# Patient Record
Sex: Female | Born: 1945
Health system: Southern US, Community
[De-identification: ages and names within clinical notes are randomized; demographics above are authoritative.]

## PROBLEM LIST (undated history)

## (undated) DIAGNOSIS — J449 Chronic obstructive pulmonary disease, unspecified: Secondary | ICD-10-CM

## (undated) DIAGNOSIS — C50919 Malignant neoplasm of unspecified site of unspecified female breast: Secondary | ICD-10-CM

## (undated) DIAGNOSIS — I509 Heart failure, unspecified: Secondary | ICD-10-CM

## (undated) DIAGNOSIS — N809 Endometriosis, unspecified: Secondary | ICD-10-CM

## (undated) HISTORY — DX: Endometriosis, unspecified: N80.9

## (undated) HISTORY — DX: Malignant neoplasm of unspecified site of unspecified female breast: C50.919

## (undated) HISTORY — DX: Chronic obstructive pulmonary disease, unspecified: J44.9

## (undated) HISTORY — PX: MASTECTOMY: SHX3

## (undated) HISTORY — PX: ABDOMINAL HYSTERECTOMY: SUR658

---

## 1999-08-24 ENCOUNTER — Ambulatory Visit (HOSPITAL_BASED_OUTPATIENT_CLINIC_OR_DEPARTMENT_OTHER): Admission: RE | Admit: 1999-08-24 | Discharge: 1999-08-24 | Payer: Self-pay | Admitting: Plastic Surgery

## 2005-06-13 ENCOUNTER — Encounter: Admission: RE | Admit: 2005-06-13 | Discharge: 2005-06-13 | Payer: Self-pay | Admitting: Internal Medicine

## 2015-11-17 DIAGNOSIS — L821 Other seborrheic keratosis: Secondary | ICD-10-CM | POA: Diagnosis not present

## 2015-11-17 DIAGNOSIS — D1801 Hemangioma of skin and subcutaneous tissue: Secondary | ICD-10-CM | POA: Diagnosis not present

## 2015-11-17 DIAGNOSIS — L578 Other skin changes due to chronic exposure to nonionizing radiation: Secondary | ICD-10-CM | POA: Diagnosis not present

## 2015-11-17 DIAGNOSIS — L570 Actinic keratosis: Secondary | ICD-10-CM | POA: Diagnosis not present

## 2015-11-25 DIAGNOSIS — Z853 Personal history of malignant neoplasm of breast: Secondary | ICD-10-CM | POA: Diagnosis not present

## 2015-12-03 DIAGNOSIS — C50812 Malignant neoplasm of overlapping sites of left female breast: Secondary | ICD-10-CM | POA: Diagnosis not present

## 2016-01-12 DIAGNOSIS — M62838 Other muscle spasm: Secondary | ICD-10-CM | POA: Diagnosis not present

## 2016-01-12 DIAGNOSIS — M9904 Segmental and somatic dysfunction of sacral region: Secondary | ICD-10-CM | POA: Diagnosis not present

## 2016-01-12 DIAGNOSIS — M9903 Segmental and somatic dysfunction of lumbar region: Secondary | ICD-10-CM | POA: Diagnosis not present

## 2016-01-12 DIAGNOSIS — M9902 Segmental and somatic dysfunction of thoracic region: Secondary | ICD-10-CM | POA: Diagnosis not present

## 2016-01-12 DIAGNOSIS — M546 Pain in thoracic spine: Secondary | ICD-10-CM | POA: Diagnosis not present

## 2016-01-12 DIAGNOSIS — M545 Low back pain: Secondary | ICD-10-CM | POA: Diagnosis not present

## 2016-01-17 DIAGNOSIS — Z1389 Encounter for screening for other disorder: Secondary | ICD-10-CM | POA: Diagnosis not present

## 2016-01-17 DIAGNOSIS — E785 Hyperlipidemia, unspecified: Secondary | ICD-10-CM | POA: Diagnosis not present

## 2016-01-17 DIAGNOSIS — M9902 Segmental and somatic dysfunction of thoracic region: Secondary | ICD-10-CM | POA: Diagnosis not present

## 2016-01-17 DIAGNOSIS — I10 Essential (primary) hypertension: Secondary | ICD-10-CM | POA: Diagnosis not present

## 2016-01-17 DIAGNOSIS — Z0000 Encounter for general adult medical examination without abnormal findings: Secondary | ICD-10-CM | POA: Diagnosis not present

## 2016-01-17 DIAGNOSIS — J449 Chronic obstructive pulmonary disease, unspecified: Secondary | ICD-10-CM | POA: Diagnosis not present

## 2016-01-17 DIAGNOSIS — M9904 Segmental and somatic dysfunction of sacral region: Secondary | ICD-10-CM | POA: Diagnosis not present

## 2016-01-17 DIAGNOSIS — R5383 Other fatigue: Secondary | ICD-10-CM | POA: Diagnosis not present

## 2016-01-17 DIAGNOSIS — M546 Pain in thoracic spine: Secondary | ICD-10-CM | POA: Diagnosis not present

## 2016-01-17 DIAGNOSIS — M545 Low back pain: Secondary | ICD-10-CM | POA: Diagnosis not present

## 2016-01-17 DIAGNOSIS — Z6827 Body mass index (BMI) 27.0-27.9, adult: Secondary | ICD-10-CM | POA: Diagnosis not present

## 2016-01-17 DIAGNOSIS — M62838 Other muscle spasm: Secondary | ICD-10-CM | POA: Diagnosis not present

## 2016-01-17 DIAGNOSIS — M9903 Segmental and somatic dysfunction of lumbar region: Secondary | ICD-10-CM | POA: Diagnosis not present

## 2016-01-17 DIAGNOSIS — E039 Hypothyroidism, unspecified: Secondary | ICD-10-CM | POA: Diagnosis not present

## 2016-01-17 DIAGNOSIS — Z719 Counseling, unspecified: Secondary | ICD-10-CM | POA: Diagnosis not present

## 2016-01-19 DIAGNOSIS — M9903 Segmental and somatic dysfunction of lumbar region: Secondary | ICD-10-CM | POA: Diagnosis not present

## 2016-01-19 DIAGNOSIS — M546 Pain in thoracic spine: Secondary | ICD-10-CM | POA: Diagnosis not present

## 2016-01-19 DIAGNOSIS — M9902 Segmental and somatic dysfunction of thoracic region: Secondary | ICD-10-CM | POA: Diagnosis not present

## 2016-01-19 DIAGNOSIS — M62838 Other muscle spasm: Secondary | ICD-10-CM | POA: Diagnosis not present

## 2016-01-19 DIAGNOSIS — M545 Low back pain: Secondary | ICD-10-CM | POA: Diagnosis not present

## 2016-01-19 DIAGNOSIS — M9904 Segmental and somatic dysfunction of sacral region: Secondary | ICD-10-CM | POA: Diagnosis not present

## 2016-01-26 DIAGNOSIS — M9903 Segmental and somatic dysfunction of lumbar region: Secondary | ICD-10-CM | POA: Diagnosis not present

## 2016-01-26 DIAGNOSIS — M546 Pain in thoracic spine: Secondary | ICD-10-CM | POA: Diagnosis not present

## 2016-01-26 DIAGNOSIS — M9902 Segmental and somatic dysfunction of thoracic region: Secondary | ICD-10-CM | POA: Diagnosis not present

## 2016-01-26 DIAGNOSIS — M9904 Segmental and somatic dysfunction of sacral region: Secondary | ICD-10-CM | POA: Diagnosis not present

## 2016-01-26 DIAGNOSIS — M545 Low back pain: Secondary | ICD-10-CM | POA: Diagnosis not present

## 2016-01-26 DIAGNOSIS — M62838 Other muscle spasm: Secondary | ICD-10-CM | POA: Diagnosis not present

## 2016-01-28 DIAGNOSIS — E785 Hyperlipidemia, unspecified: Secondary | ICD-10-CM | POA: Diagnosis not present

## 2016-01-28 DIAGNOSIS — Z6827 Body mass index (BMI) 27.0-27.9, adult: Secondary | ICD-10-CM | POA: Diagnosis not present

## 2016-01-28 DIAGNOSIS — M62838 Other muscle spasm: Secondary | ICD-10-CM | POA: Diagnosis not present

## 2016-01-28 DIAGNOSIS — M9903 Segmental and somatic dysfunction of lumbar region: Secondary | ICD-10-CM | POA: Diagnosis not present

## 2016-01-28 DIAGNOSIS — M546 Pain in thoracic spine: Secondary | ICD-10-CM | POA: Diagnosis not present

## 2016-01-28 DIAGNOSIS — M545 Low back pain: Secondary | ICD-10-CM | POA: Diagnosis not present

## 2016-01-28 DIAGNOSIS — E039 Hypothyroidism, unspecified: Secondary | ICD-10-CM | POA: Diagnosis not present

## 2016-01-28 DIAGNOSIS — M9904 Segmental and somatic dysfunction of sacral region: Secondary | ICD-10-CM | POA: Diagnosis not present

## 2016-01-28 DIAGNOSIS — M9902 Segmental and somatic dysfunction of thoracic region: Secondary | ICD-10-CM | POA: Diagnosis not present

## 2016-02-02 DIAGNOSIS — M62838 Other muscle spasm: Secondary | ICD-10-CM | POA: Diagnosis not present

## 2016-02-02 DIAGNOSIS — M9904 Segmental and somatic dysfunction of sacral region: Secondary | ICD-10-CM | POA: Diagnosis not present

## 2016-02-02 DIAGNOSIS — M9903 Segmental and somatic dysfunction of lumbar region: Secondary | ICD-10-CM | POA: Diagnosis not present

## 2016-02-02 DIAGNOSIS — M545 Low back pain: Secondary | ICD-10-CM | POA: Diagnosis not present

## 2016-02-02 DIAGNOSIS — M9902 Segmental and somatic dysfunction of thoracic region: Secondary | ICD-10-CM | POA: Diagnosis not present

## 2016-02-02 DIAGNOSIS — M546 Pain in thoracic spine: Secondary | ICD-10-CM | POA: Diagnosis not present

## 2016-02-11 DIAGNOSIS — M9902 Segmental and somatic dysfunction of thoracic region: Secondary | ICD-10-CM | POA: Diagnosis not present

## 2016-02-11 DIAGNOSIS — M9903 Segmental and somatic dysfunction of lumbar region: Secondary | ICD-10-CM | POA: Diagnosis not present

## 2016-02-11 DIAGNOSIS — M62838 Other muscle spasm: Secondary | ICD-10-CM | POA: Diagnosis not present

## 2016-02-11 DIAGNOSIS — M546 Pain in thoracic spine: Secondary | ICD-10-CM | POA: Diagnosis not present

## 2016-02-11 DIAGNOSIS — M9904 Segmental and somatic dysfunction of sacral region: Secondary | ICD-10-CM | POA: Diagnosis not present

## 2016-02-11 DIAGNOSIS — M545 Low back pain: Secondary | ICD-10-CM | POA: Diagnosis not present

## 2016-02-22 DIAGNOSIS — R05 Cough: Secondary | ICD-10-CM | POA: Diagnosis not present

## 2016-02-22 DIAGNOSIS — Z6826 Body mass index (BMI) 26.0-26.9, adult: Secondary | ICD-10-CM | POA: Diagnosis not present

## 2016-02-23 DIAGNOSIS — L298 Other pruritus: Secondary | ICD-10-CM | POA: Diagnosis not present

## 2016-02-23 DIAGNOSIS — R238 Other skin changes: Secondary | ICD-10-CM | POA: Diagnosis not present

## 2016-02-23 DIAGNOSIS — D485 Neoplasm of uncertain behavior of skin: Secondary | ICD-10-CM | POA: Diagnosis not present

## 2016-02-23 DIAGNOSIS — L820 Inflamed seborrheic keratosis: Secondary | ICD-10-CM | POA: Diagnosis not present

## 2016-02-23 DIAGNOSIS — L821 Other seborrheic keratosis: Secondary | ICD-10-CM | POA: Diagnosis not present

## 2016-02-23 DIAGNOSIS — L538 Other specified erythematous conditions: Secondary | ICD-10-CM | POA: Diagnosis not present

## 2016-02-23 DIAGNOSIS — L570 Actinic keratosis: Secondary | ICD-10-CM | POA: Diagnosis not present

## 2016-03-02 DIAGNOSIS — L570 Actinic keratosis: Secondary | ICD-10-CM | POA: Diagnosis not present

## 2016-03-03 DIAGNOSIS — M9903 Segmental and somatic dysfunction of lumbar region: Secondary | ICD-10-CM | POA: Diagnosis not present

## 2016-03-03 DIAGNOSIS — M545 Low back pain: Secondary | ICD-10-CM | POA: Diagnosis not present

## 2016-03-03 DIAGNOSIS — M546 Pain in thoracic spine: Secondary | ICD-10-CM | POA: Diagnosis not present

## 2016-03-03 DIAGNOSIS — M9904 Segmental and somatic dysfunction of sacral region: Secondary | ICD-10-CM | POA: Diagnosis not present

## 2016-03-03 DIAGNOSIS — M62838 Other muscle spasm: Secondary | ICD-10-CM | POA: Diagnosis not present

## 2016-03-03 DIAGNOSIS — M9902 Segmental and somatic dysfunction of thoracic region: Secondary | ICD-10-CM | POA: Diagnosis not present

## 2016-03-08 DIAGNOSIS — M546 Pain in thoracic spine: Secondary | ICD-10-CM | POA: Diagnosis not present

## 2016-03-08 DIAGNOSIS — M545 Low back pain: Secondary | ICD-10-CM | POA: Diagnosis not present

## 2016-03-08 DIAGNOSIS — M9903 Segmental and somatic dysfunction of lumbar region: Secondary | ICD-10-CM | POA: Diagnosis not present

## 2016-03-08 DIAGNOSIS — M9904 Segmental and somatic dysfunction of sacral region: Secondary | ICD-10-CM | POA: Diagnosis not present

## 2016-03-08 DIAGNOSIS — M9902 Segmental and somatic dysfunction of thoracic region: Secondary | ICD-10-CM | POA: Diagnosis not present

## 2016-03-08 DIAGNOSIS — M62838 Other muscle spasm: Secondary | ICD-10-CM | POA: Diagnosis not present

## 2016-05-09 DIAGNOSIS — I10 Essential (primary) hypertension: Secondary | ICD-10-CM | POA: Diagnosis not present

## 2016-05-09 DIAGNOSIS — F5102 Adjustment insomnia: Secondary | ICD-10-CM | POA: Diagnosis not present

## 2016-05-09 DIAGNOSIS — E039 Hypothyroidism, unspecified: Secondary | ICD-10-CM | POA: Diagnosis not present

## 2016-05-09 DIAGNOSIS — J449 Chronic obstructive pulmonary disease, unspecified: Secondary | ICD-10-CM | POA: Diagnosis not present

## 2016-05-09 DIAGNOSIS — E7800 Pure hypercholesterolemia, unspecified: Secondary | ICD-10-CM | POA: Diagnosis not present

## 2016-05-09 DIAGNOSIS — R0789 Other chest pain: Secondary | ICD-10-CM | POA: Diagnosis not present

## 2016-07-11 DIAGNOSIS — D485 Neoplasm of uncertain behavior of skin: Secondary | ICD-10-CM | POA: Diagnosis not present

## 2016-07-11 DIAGNOSIS — L820 Inflamed seborrheic keratosis: Secondary | ICD-10-CM | POA: Diagnosis not present

## 2016-07-11 DIAGNOSIS — L570 Actinic keratosis: Secondary | ICD-10-CM | POA: Diagnosis not present

## 2016-07-11 DIAGNOSIS — Z85828 Personal history of other malignant neoplasm of skin: Secondary | ICD-10-CM | POA: Diagnosis not present

## 2016-07-26 DIAGNOSIS — N651 Disproportion of reconstructed breast: Secondary | ICD-10-CM | POA: Diagnosis not present

## 2016-07-26 DIAGNOSIS — Z9012 Acquired absence of left breast and nipple: Secondary | ICD-10-CM | POA: Diagnosis not present

## 2016-07-26 DIAGNOSIS — Z853 Personal history of malignant neoplasm of breast: Secondary | ICD-10-CM | POA: Diagnosis not present

## 2016-08-23 DIAGNOSIS — M62838 Other muscle spasm: Secondary | ICD-10-CM | POA: Diagnosis not present

## 2016-08-23 DIAGNOSIS — M9903 Segmental and somatic dysfunction of lumbar region: Secondary | ICD-10-CM | POA: Diagnosis not present

## 2016-08-23 DIAGNOSIS — M9904 Segmental and somatic dysfunction of sacral region: Secondary | ICD-10-CM | POA: Diagnosis not present

## 2016-08-23 DIAGNOSIS — M546 Pain in thoracic spine: Secondary | ICD-10-CM | POA: Diagnosis not present

## 2016-08-23 DIAGNOSIS — M545 Low back pain: Secondary | ICD-10-CM | POA: Diagnosis not present

## 2016-08-23 DIAGNOSIS — M9902 Segmental and somatic dysfunction of thoracic region: Secondary | ICD-10-CM | POA: Diagnosis not present

## 2016-08-25 DIAGNOSIS — M9903 Segmental and somatic dysfunction of lumbar region: Secondary | ICD-10-CM | POA: Diagnosis not present

## 2016-08-25 DIAGNOSIS — M62838 Other muscle spasm: Secondary | ICD-10-CM | POA: Diagnosis not present

## 2016-08-25 DIAGNOSIS — M546 Pain in thoracic spine: Secondary | ICD-10-CM | POA: Diagnosis not present

## 2016-08-25 DIAGNOSIS — M545 Low back pain: Secondary | ICD-10-CM | POA: Diagnosis not present

## 2016-08-25 DIAGNOSIS — M9904 Segmental and somatic dysfunction of sacral region: Secondary | ICD-10-CM | POA: Diagnosis not present

## 2016-08-25 DIAGNOSIS — M9902 Segmental and somatic dysfunction of thoracic region: Secondary | ICD-10-CM | POA: Diagnosis not present

## 2016-08-28 DIAGNOSIS — M546 Pain in thoracic spine: Secondary | ICD-10-CM | POA: Diagnosis not present

## 2016-08-28 DIAGNOSIS — M62838 Other muscle spasm: Secondary | ICD-10-CM | POA: Diagnosis not present

## 2016-08-28 DIAGNOSIS — M9904 Segmental and somatic dysfunction of sacral region: Secondary | ICD-10-CM | POA: Diagnosis not present

## 2016-08-28 DIAGNOSIS — M545 Low back pain: Secondary | ICD-10-CM | POA: Diagnosis not present

## 2016-08-28 DIAGNOSIS — M9903 Segmental and somatic dysfunction of lumbar region: Secondary | ICD-10-CM | POA: Diagnosis not present

## 2016-08-28 DIAGNOSIS — M9902 Segmental and somatic dysfunction of thoracic region: Secondary | ICD-10-CM | POA: Diagnosis not present

## 2016-08-30 DIAGNOSIS — M546 Pain in thoracic spine: Secondary | ICD-10-CM | POA: Diagnosis not present

## 2016-08-30 DIAGNOSIS — M9902 Segmental and somatic dysfunction of thoracic region: Secondary | ICD-10-CM | POA: Diagnosis not present

## 2016-08-30 DIAGNOSIS — M9903 Segmental and somatic dysfunction of lumbar region: Secondary | ICD-10-CM | POA: Diagnosis not present

## 2016-08-30 DIAGNOSIS — M62838 Other muscle spasm: Secondary | ICD-10-CM | POA: Diagnosis not present

## 2016-08-30 DIAGNOSIS — M545 Low back pain: Secondary | ICD-10-CM | POA: Diagnosis not present

## 2016-08-30 DIAGNOSIS — M9904 Segmental and somatic dysfunction of sacral region: Secondary | ICD-10-CM | POA: Diagnosis not present

## 2016-09-04 DIAGNOSIS — R0602 Shortness of breath: Secondary | ICD-10-CM | POA: Diagnosis not present

## 2016-09-04 DIAGNOSIS — J449 Chronic obstructive pulmonary disease, unspecified: Secondary | ICD-10-CM | POA: Diagnosis not present

## 2016-09-04 DIAGNOSIS — Z87891 Personal history of nicotine dependence: Secondary | ICD-10-CM | POA: Diagnosis not present

## 2016-09-25 DIAGNOSIS — M9904 Segmental and somatic dysfunction of sacral region: Secondary | ICD-10-CM | POA: Diagnosis not present

## 2016-09-25 DIAGNOSIS — M545 Low back pain: Secondary | ICD-10-CM | POA: Diagnosis not present

## 2016-09-25 DIAGNOSIS — M9902 Segmental and somatic dysfunction of thoracic region: Secondary | ICD-10-CM | POA: Diagnosis not present

## 2016-09-25 DIAGNOSIS — M546 Pain in thoracic spine: Secondary | ICD-10-CM | POA: Diagnosis not present

## 2016-09-25 DIAGNOSIS — M9903 Segmental and somatic dysfunction of lumbar region: Secondary | ICD-10-CM | POA: Diagnosis not present

## 2016-09-25 DIAGNOSIS — M62838 Other muscle spasm: Secondary | ICD-10-CM | POA: Diagnosis not present

## 2016-09-25 DIAGNOSIS — Z23 Encounter for immunization: Secondary | ICD-10-CM | POA: Diagnosis not present

## 2016-09-26 DIAGNOSIS — D485 Neoplasm of uncertain behavior of skin: Secondary | ICD-10-CM | POA: Diagnosis not present

## 2016-09-26 DIAGNOSIS — L918 Other hypertrophic disorders of the skin: Secondary | ICD-10-CM | POA: Diagnosis not present

## 2016-09-26 DIAGNOSIS — L820 Inflamed seborrheic keratosis: Secondary | ICD-10-CM | POA: Diagnosis not present

## 2016-09-26 DIAGNOSIS — L988 Other specified disorders of the skin and subcutaneous tissue: Secondary | ICD-10-CM | POA: Diagnosis not present

## 2016-09-26 DIAGNOSIS — L570 Actinic keratosis: Secondary | ICD-10-CM | POA: Diagnosis not present

## 2016-09-26 DIAGNOSIS — L821 Other seborrheic keratosis: Secondary | ICD-10-CM | POA: Diagnosis not present

## 2016-09-27 DIAGNOSIS — M9904 Segmental and somatic dysfunction of sacral region: Secondary | ICD-10-CM | POA: Diagnosis not present

## 2016-09-27 DIAGNOSIS — M62838 Other muscle spasm: Secondary | ICD-10-CM | POA: Diagnosis not present

## 2016-09-27 DIAGNOSIS — M546 Pain in thoracic spine: Secondary | ICD-10-CM | POA: Diagnosis not present

## 2016-09-27 DIAGNOSIS — H25013 Cortical age-related cataract, bilateral: Secondary | ICD-10-CM | POA: Diagnosis not present

## 2016-09-27 DIAGNOSIS — M9903 Segmental and somatic dysfunction of lumbar region: Secondary | ICD-10-CM | POA: Diagnosis not present

## 2016-09-27 DIAGNOSIS — M9902 Segmental and somatic dysfunction of thoracic region: Secondary | ICD-10-CM | POA: Diagnosis not present

## 2016-09-27 DIAGNOSIS — M545 Low back pain: Secondary | ICD-10-CM | POA: Diagnosis not present

## 2016-09-29 DIAGNOSIS — M9904 Segmental and somatic dysfunction of sacral region: Secondary | ICD-10-CM | POA: Diagnosis not present

## 2016-09-29 DIAGNOSIS — M9903 Segmental and somatic dysfunction of lumbar region: Secondary | ICD-10-CM | POA: Diagnosis not present

## 2016-09-29 DIAGNOSIS — M546 Pain in thoracic spine: Secondary | ICD-10-CM | POA: Diagnosis not present

## 2016-09-29 DIAGNOSIS — M545 Low back pain: Secondary | ICD-10-CM | POA: Diagnosis not present

## 2016-09-29 DIAGNOSIS — M62838 Other muscle spasm: Secondary | ICD-10-CM | POA: Diagnosis not present

## 2016-09-29 DIAGNOSIS — M9902 Segmental and somatic dysfunction of thoracic region: Secondary | ICD-10-CM | POA: Diagnosis not present

## 2016-10-03 DIAGNOSIS — M545 Low back pain: Secondary | ICD-10-CM | POA: Diagnosis not present

## 2016-10-03 DIAGNOSIS — M9903 Segmental and somatic dysfunction of lumbar region: Secondary | ICD-10-CM | POA: Diagnosis not present

## 2016-10-03 DIAGNOSIS — M9902 Segmental and somatic dysfunction of thoracic region: Secondary | ICD-10-CM | POA: Diagnosis not present

## 2016-10-03 DIAGNOSIS — M546 Pain in thoracic spine: Secondary | ICD-10-CM | POA: Diagnosis not present

## 2016-10-03 DIAGNOSIS — M9904 Segmental and somatic dysfunction of sacral region: Secondary | ICD-10-CM | POA: Diagnosis not present

## 2016-10-03 DIAGNOSIS — M62838 Other muscle spasm: Secondary | ICD-10-CM | POA: Diagnosis not present

## 2016-10-09 DIAGNOSIS — M9903 Segmental and somatic dysfunction of lumbar region: Secondary | ICD-10-CM | POA: Diagnosis not present

## 2016-10-09 DIAGNOSIS — M545 Low back pain: Secondary | ICD-10-CM | POA: Diagnosis not present

## 2016-10-09 DIAGNOSIS — M546 Pain in thoracic spine: Secondary | ICD-10-CM | POA: Diagnosis not present

## 2016-10-09 DIAGNOSIS — M62838 Other muscle spasm: Secondary | ICD-10-CM | POA: Diagnosis not present

## 2016-10-09 DIAGNOSIS — L570 Actinic keratosis: Secondary | ICD-10-CM | POA: Diagnosis not present

## 2016-10-09 DIAGNOSIS — M9904 Segmental and somatic dysfunction of sacral region: Secondary | ICD-10-CM | POA: Diagnosis not present

## 2016-10-09 DIAGNOSIS — M9902 Segmental and somatic dysfunction of thoracic region: Secondary | ICD-10-CM | POA: Diagnosis not present

## 2016-10-16 DIAGNOSIS — M546 Pain in thoracic spine: Secondary | ICD-10-CM | POA: Diagnosis not present

## 2016-10-16 DIAGNOSIS — M545 Low back pain: Secondary | ICD-10-CM | POA: Diagnosis not present

## 2016-10-16 DIAGNOSIS — M9902 Segmental and somatic dysfunction of thoracic region: Secondary | ICD-10-CM | POA: Diagnosis not present

## 2016-10-16 DIAGNOSIS — M9903 Segmental and somatic dysfunction of lumbar region: Secondary | ICD-10-CM | POA: Diagnosis not present

## 2016-10-16 DIAGNOSIS — M62838 Other muscle spasm: Secondary | ICD-10-CM | POA: Diagnosis not present

## 2016-10-16 DIAGNOSIS — M9904 Segmental and somatic dysfunction of sacral region: Secondary | ICD-10-CM | POA: Diagnosis not present

## 2016-10-23 DIAGNOSIS — E7800 Pure hypercholesterolemia, unspecified: Secondary | ICD-10-CM | POA: Diagnosis not present

## 2016-10-24 DIAGNOSIS — I10 Essential (primary) hypertension: Secondary | ICD-10-CM | POA: Diagnosis not present

## 2016-10-24 DIAGNOSIS — E7800 Pure hypercholesterolemia, unspecified: Secondary | ICD-10-CM | POA: Diagnosis not present

## 2016-10-24 DIAGNOSIS — N941 Unspecified dyspareunia: Secondary | ICD-10-CM | POA: Diagnosis not present

## 2016-11-02 DIAGNOSIS — N952 Postmenopausal atrophic vaginitis: Secondary | ICD-10-CM | POA: Diagnosis not present

## 2016-11-02 DIAGNOSIS — N941 Unspecified dyspareunia: Secondary | ICD-10-CM | POA: Diagnosis not present

## 2016-11-02 DIAGNOSIS — Z853 Personal history of malignant neoplasm of breast: Secondary | ICD-10-CM | POA: Diagnosis not present

## 2017-01-09 DIAGNOSIS — Z853 Personal history of malignant neoplasm of breast: Secondary | ICD-10-CM | POA: Diagnosis not present

## 2017-01-09 DIAGNOSIS — N6459 Other signs and symptoms in breast: Secondary | ICD-10-CM | POA: Diagnosis not present

## 2017-01-11 DIAGNOSIS — D2339 Other benign neoplasm of skin of other parts of face: Secondary | ICD-10-CM | POA: Diagnosis not present

## 2017-01-11 DIAGNOSIS — L818 Other specified disorders of pigmentation: Secondary | ICD-10-CM | POA: Diagnosis not present

## 2017-01-11 DIAGNOSIS — L723 Sebaceous cyst: Secondary | ICD-10-CM | POA: Diagnosis not present

## 2017-01-11 DIAGNOSIS — I780 Hereditary hemorrhagic telangiectasia: Secondary | ICD-10-CM | POA: Diagnosis not present

## 2017-01-11 DIAGNOSIS — C4492 Squamous cell carcinoma of skin, unspecified: Secondary | ICD-10-CM | POA: Diagnosis not present

## 2017-02-22 DIAGNOSIS — C44722 Squamous cell carcinoma of skin of right lower limb, including hip: Secondary | ICD-10-CM | POA: Diagnosis not present

## 2017-03-08 DIAGNOSIS — C4492 Squamous cell carcinoma of skin, unspecified: Secondary | ICD-10-CM | POA: Diagnosis not present

## 2017-03-08 DIAGNOSIS — D485 Neoplasm of uncertain behavior of skin: Secondary | ICD-10-CM | POA: Diagnosis not present

## 2017-03-13 DIAGNOSIS — Z853 Personal history of malignant neoplasm of breast: Secondary | ICD-10-CM | POA: Diagnosis not present

## 2017-03-13 DIAGNOSIS — Z9012 Acquired absence of left breast and nipple: Secondary | ICD-10-CM | POA: Diagnosis not present

## 2017-03-14 DIAGNOSIS — H02824 Cysts of left upper eyelid: Secondary | ICD-10-CM | POA: Diagnosis not present

## 2017-03-14 DIAGNOSIS — J3089 Other allergic rhinitis: Secondary | ICD-10-CM | POA: Diagnosis not present

## 2017-03-26 DIAGNOSIS — H0014 Chalazion left upper eyelid: Secondary | ICD-10-CM | POA: Diagnosis not present

## 2017-04-11 DIAGNOSIS — Z85828 Personal history of other malignant neoplasm of skin: Secondary | ICD-10-CM | POA: Diagnosis not present

## 2017-04-11 DIAGNOSIS — C44729 Squamous cell carcinoma of skin of left lower limb, including hip: Secondary | ICD-10-CM | POA: Diagnosis not present

## 2017-04-13 DIAGNOSIS — Z853 Personal history of malignant neoplasm of breast: Secondary | ICD-10-CM | POA: Diagnosis not present

## 2017-04-13 DIAGNOSIS — Z45812 Encounter for adjustment or removal of left breast implant: Secondary | ICD-10-CM | POA: Diagnosis not present

## 2017-04-13 DIAGNOSIS — Z01810 Encounter for preprocedural cardiovascular examination: Secondary | ICD-10-CM | POA: Diagnosis not present

## 2017-04-16 DIAGNOSIS — Z01818 Encounter for other preprocedural examination: Secondary | ICD-10-CM | POA: Diagnosis not present

## 2017-04-19 DIAGNOSIS — D485 Neoplasm of uncertain behavior of skin: Secondary | ICD-10-CM | POA: Diagnosis not present

## 2017-04-19 DIAGNOSIS — C44729 Squamous cell carcinoma of skin of left lower limb, including hip: Secondary | ICD-10-CM | POA: Diagnosis not present

## 2017-04-24 DIAGNOSIS — Z853 Personal history of malignant neoplasm of breast: Secondary | ICD-10-CM | POA: Diagnosis not present

## 2017-04-24 DIAGNOSIS — Z421 Encounter for breast reconstruction following mastectomy: Secondary | ICD-10-CM | POA: Diagnosis not present

## 2017-04-24 DIAGNOSIS — Z9012 Acquired absence of left breast and nipple: Secondary | ICD-10-CM | POA: Diagnosis not present

## 2017-05-09 DIAGNOSIS — Z85828 Personal history of other malignant neoplasm of skin: Secondary | ICD-10-CM | POA: Diagnosis not present

## 2017-05-09 DIAGNOSIS — C44729 Squamous cell carcinoma of skin of left lower limb, including hip: Secondary | ICD-10-CM | POA: Diagnosis not present

## 2017-06-21 DIAGNOSIS — Z13820 Encounter for screening for osteoporosis: Secondary | ICD-10-CM | POA: Diagnosis not present

## 2017-06-21 DIAGNOSIS — I10 Essential (primary) hypertension: Secondary | ICD-10-CM | POA: Diagnosis not present

## 2017-06-21 DIAGNOSIS — J449 Chronic obstructive pulmonary disease, unspecified: Secondary | ICD-10-CM | POA: Diagnosis not present

## 2017-06-21 DIAGNOSIS — Z1159 Encounter for screening for other viral diseases: Secondary | ICD-10-CM | POA: Diagnosis not present

## 2017-06-21 DIAGNOSIS — E039 Hypothyroidism, unspecified: Secondary | ICD-10-CM | POA: Diagnosis not present

## 2017-06-21 DIAGNOSIS — Z1389 Encounter for screening for other disorder: Secondary | ICD-10-CM | POA: Diagnosis not present

## 2017-06-21 DIAGNOSIS — Z0000 Encounter for general adult medical examination without abnormal findings: Secondary | ICD-10-CM | POA: Diagnosis not present

## 2017-07-24 DIAGNOSIS — Z85828 Personal history of other malignant neoplasm of skin: Secondary | ICD-10-CM | POA: Diagnosis not present

## 2017-07-24 DIAGNOSIS — D225 Melanocytic nevi of trunk: Secondary | ICD-10-CM | POA: Diagnosis not present

## 2017-07-24 DIAGNOSIS — L570 Actinic keratosis: Secondary | ICD-10-CM | POA: Diagnosis not present

## 2017-07-24 DIAGNOSIS — L821 Other seborrheic keratosis: Secondary | ICD-10-CM | POA: Diagnosis not present

## 2017-07-24 DIAGNOSIS — D1801 Hemangioma of skin and subcutaneous tissue: Secondary | ICD-10-CM | POA: Diagnosis not present

## 2017-07-24 DIAGNOSIS — M720 Palmar fascial fibromatosis [Dupuytren]: Secondary | ICD-10-CM | POA: Diagnosis not present

## 2017-08-16 DIAGNOSIS — Z780 Asymptomatic menopausal state: Secondary | ICD-10-CM | POA: Diagnosis not present

## 2017-08-16 DIAGNOSIS — M8588 Other specified disorders of bone density and structure, other site: Secondary | ICD-10-CM | POA: Diagnosis not present

## 2017-08-16 DIAGNOSIS — Z23 Encounter for immunization: Secondary | ICD-10-CM | POA: Diagnosis not present

## 2017-08-28 DIAGNOSIS — E039 Hypothyroidism, unspecified: Secondary | ICD-10-CM | POA: Diagnosis not present

## 2017-10-11 DIAGNOSIS — J449 Chronic obstructive pulmonary disease, unspecified: Secondary | ICD-10-CM | POA: Diagnosis not present

## 2017-10-11 DIAGNOSIS — E039 Hypothyroidism, unspecified: Secondary | ICD-10-CM | POA: Diagnosis not present

## 2017-10-11 DIAGNOSIS — I10 Essential (primary) hypertension: Secondary | ICD-10-CM | POA: Diagnosis not present

## 2017-10-11 DIAGNOSIS — Z853 Personal history of malignant neoplasm of breast: Secondary | ICD-10-CM | POA: Diagnosis not present

## 2017-12-18 DIAGNOSIS — R0789 Other chest pain: Secondary | ICD-10-CM | POA: Diagnosis not present

## 2018-01-23 DIAGNOSIS — M9902 Segmental and somatic dysfunction of thoracic region: Secondary | ICD-10-CM | POA: Diagnosis not present

## 2018-01-23 DIAGNOSIS — M542 Cervicalgia: Secondary | ICD-10-CM | POA: Diagnosis not present

## 2018-01-23 DIAGNOSIS — M546 Pain in thoracic spine: Secondary | ICD-10-CM | POA: Diagnosis not present

## 2018-01-23 DIAGNOSIS — M9901 Segmental and somatic dysfunction of cervical region: Secondary | ICD-10-CM | POA: Diagnosis not present

## 2018-01-23 DIAGNOSIS — M791 Myalgia, unspecified site: Secondary | ICD-10-CM | POA: Diagnosis not present

## 2018-01-30 DIAGNOSIS — E039 Hypothyroidism, unspecified: Secondary | ICD-10-CM | POA: Diagnosis not present

## 2018-01-30 DIAGNOSIS — I10 Essential (primary) hypertension: Secondary | ICD-10-CM | POA: Diagnosis not present

## 2018-01-30 DIAGNOSIS — Z853 Personal history of malignant neoplasm of breast: Secondary | ICD-10-CM | POA: Diagnosis not present

## 2018-01-30 DIAGNOSIS — J449 Chronic obstructive pulmonary disease, unspecified: Secondary | ICD-10-CM | POA: Diagnosis not present

## 2018-03-08 DIAGNOSIS — M542 Cervicalgia: Secondary | ICD-10-CM | POA: Diagnosis not present

## 2018-03-08 DIAGNOSIS — M9902 Segmental and somatic dysfunction of thoracic region: Secondary | ICD-10-CM | POA: Diagnosis not present

## 2018-03-08 DIAGNOSIS — M9901 Segmental and somatic dysfunction of cervical region: Secondary | ICD-10-CM | POA: Diagnosis not present

## 2018-03-08 DIAGNOSIS — M546 Pain in thoracic spine: Secondary | ICD-10-CM | POA: Diagnosis not present

## 2018-03-08 DIAGNOSIS — M791 Myalgia, unspecified site: Secondary | ICD-10-CM | POA: Diagnosis not present

## 2018-03-16 DIAGNOSIS — R509 Fever, unspecified: Secondary | ICD-10-CM | POA: Diagnosis not present

## 2018-03-16 DIAGNOSIS — J449 Chronic obstructive pulmonary disease, unspecified: Secondary | ICD-10-CM | POA: Diagnosis not present

## 2018-03-29 DIAGNOSIS — M9902 Segmental and somatic dysfunction of thoracic region: Secondary | ICD-10-CM | POA: Diagnosis not present

## 2018-03-29 DIAGNOSIS — M546 Pain in thoracic spine: Secondary | ICD-10-CM | POA: Diagnosis not present

## 2018-03-29 DIAGNOSIS — M542 Cervicalgia: Secondary | ICD-10-CM | POA: Diagnosis not present

## 2018-03-29 DIAGNOSIS — M791 Myalgia, unspecified site: Secondary | ICD-10-CM | POA: Diagnosis not present

## 2018-03-29 DIAGNOSIS — M9901 Segmental and somatic dysfunction of cervical region: Secondary | ICD-10-CM | POA: Diagnosis not present

## 2018-04-04 DIAGNOSIS — M546 Pain in thoracic spine: Secondary | ICD-10-CM | POA: Diagnosis not present

## 2018-04-04 DIAGNOSIS — M9901 Segmental and somatic dysfunction of cervical region: Secondary | ICD-10-CM | POA: Diagnosis not present

## 2018-04-04 DIAGNOSIS — M542 Cervicalgia: Secondary | ICD-10-CM | POA: Diagnosis not present

## 2018-04-04 DIAGNOSIS — M9902 Segmental and somatic dysfunction of thoracic region: Secondary | ICD-10-CM | POA: Diagnosis not present

## 2018-04-04 DIAGNOSIS — M791 Myalgia, unspecified site: Secondary | ICD-10-CM | POA: Diagnosis not present

## 2018-04-05 DIAGNOSIS — D1801 Hemangioma of skin and subcutaneous tissue: Secondary | ICD-10-CM | POA: Diagnosis not present

## 2018-04-05 DIAGNOSIS — L570 Actinic keratosis: Secondary | ICD-10-CM | POA: Diagnosis not present

## 2018-04-05 DIAGNOSIS — L814 Other melanin hyperpigmentation: Secondary | ICD-10-CM | POA: Diagnosis not present

## 2018-04-05 DIAGNOSIS — L821 Other seborrheic keratosis: Secondary | ICD-10-CM | POA: Diagnosis not present

## 2018-04-05 DIAGNOSIS — Z85828 Personal history of other malignant neoplasm of skin: Secondary | ICD-10-CM | POA: Diagnosis not present

## 2018-04-05 DIAGNOSIS — D225 Melanocytic nevi of trunk: Secondary | ICD-10-CM | POA: Diagnosis not present

## 2018-04-05 DIAGNOSIS — D2272 Melanocytic nevi of left lower limb, including hip: Secondary | ICD-10-CM | POA: Diagnosis not present

## 2018-04-05 DIAGNOSIS — M720 Palmar fascial fibromatosis [Dupuytren]: Secondary | ICD-10-CM | POA: Diagnosis not present

## 2018-05-02 DIAGNOSIS — M720 Palmar fascial fibromatosis [Dupuytren]: Secondary | ICD-10-CM | POA: Diagnosis not present

## 2018-05-29 DIAGNOSIS — M546 Pain in thoracic spine: Secondary | ICD-10-CM | POA: Diagnosis not present

## 2018-05-29 DIAGNOSIS — M9904 Segmental and somatic dysfunction of sacral region: Secondary | ICD-10-CM | POA: Diagnosis not present

## 2018-05-29 DIAGNOSIS — M542 Cervicalgia: Secondary | ICD-10-CM | POA: Diagnosis not present

## 2018-05-29 DIAGNOSIS — M545 Low back pain: Secondary | ICD-10-CM | POA: Diagnosis not present

## 2018-05-29 DIAGNOSIS — M9901 Segmental and somatic dysfunction of cervical region: Secondary | ICD-10-CM | POA: Diagnosis not present

## 2018-05-29 DIAGNOSIS — M9902 Segmental and somatic dysfunction of thoracic region: Secondary | ICD-10-CM | POA: Diagnosis not present

## 2018-05-29 DIAGNOSIS — M791 Myalgia, unspecified site: Secondary | ICD-10-CM | POA: Diagnosis not present

## 2018-05-29 DIAGNOSIS — H02845 Edema of left lower eyelid: Secondary | ICD-10-CM | POA: Diagnosis not present

## 2018-05-29 DIAGNOSIS — H0015 Chalazion left lower eyelid: Secondary | ICD-10-CM | POA: Diagnosis not present

## 2018-05-29 DIAGNOSIS — M9903 Segmental and somatic dysfunction of lumbar region: Secondary | ICD-10-CM | POA: Diagnosis not present

## 2018-05-31 DIAGNOSIS — M9903 Segmental and somatic dysfunction of lumbar region: Secondary | ICD-10-CM | POA: Diagnosis not present

## 2018-05-31 DIAGNOSIS — M9901 Segmental and somatic dysfunction of cervical region: Secondary | ICD-10-CM | POA: Diagnosis not present

## 2018-05-31 DIAGNOSIS — M9902 Segmental and somatic dysfunction of thoracic region: Secondary | ICD-10-CM | POA: Diagnosis not present

## 2018-05-31 DIAGNOSIS — M546 Pain in thoracic spine: Secondary | ICD-10-CM | POA: Diagnosis not present

## 2018-05-31 DIAGNOSIS — M791 Myalgia, unspecified site: Secondary | ICD-10-CM | POA: Diagnosis not present

## 2018-05-31 DIAGNOSIS — M542 Cervicalgia: Secondary | ICD-10-CM | POA: Diagnosis not present

## 2018-05-31 DIAGNOSIS — M9904 Segmental and somatic dysfunction of sacral region: Secondary | ICD-10-CM | POA: Diagnosis not present

## 2018-05-31 DIAGNOSIS — M545 Low back pain: Secondary | ICD-10-CM | POA: Diagnosis not present

## 2018-06-10 DIAGNOSIS — M545 Low back pain: Secondary | ICD-10-CM | POA: Diagnosis not present

## 2018-06-10 DIAGNOSIS — M9903 Segmental and somatic dysfunction of lumbar region: Secondary | ICD-10-CM | POA: Diagnosis not present

## 2018-06-10 DIAGNOSIS — M9902 Segmental and somatic dysfunction of thoracic region: Secondary | ICD-10-CM | POA: Diagnosis not present

## 2018-06-10 DIAGNOSIS — M9901 Segmental and somatic dysfunction of cervical region: Secondary | ICD-10-CM | POA: Diagnosis not present

## 2018-06-10 DIAGNOSIS — M9904 Segmental and somatic dysfunction of sacral region: Secondary | ICD-10-CM | POA: Diagnosis not present

## 2018-06-10 DIAGNOSIS — M546 Pain in thoracic spine: Secondary | ICD-10-CM | POA: Diagnosis not present

## 2018-06-10 DIAGNOSIS — M791 Myalgia, unspecified site: Secondary | ICD-10-CM | POA: Diagnosis not present

## 2018-06-10 DIAGNOSIS — M542 Cervicalgia: Secondary | ICD-10-CM | POA: Diagnosis not present

## 2018-06-13 DIAGNOSIS — M9903 Segmental and somatic dysfunction of lumbar region: Secondary | ICD-10-CM | POA: Diagnosis not present

## 2018-06-13 DIAGNOSIS — M9904 Segmental and somatic dysfunction of sacral region: Secondary | ICD-10-CM | POA: Diagnosis not present

## 2018-06-13 DIAGNOSIS — M546 Pain in thoracic spine: Secondary | ICD-10-CM | POA: Diagnosis not present

## 2018-06-13 DIAGNOSIS — M9901 Segmental and somatic dysfunction of cervical region: Secondary | ICD-10-CM | POA: Diagnosis not present

## 2018-06-13 DIAGNOSIS — M542 Cervicalgia: Secondary | ICD-10-CM | POA: Diagnosis not present

## 2018-06-13 DIAGNOSIS — M9902 Segmental and somatic dysfunction of thoracic region: Secondary | ICD-10-CM | POA: Diagnosis not present

## 2018-06-13 DIAGNOSIS — M545 Low back pain: Secondary | ICD-10-CM | POA: Diagnosis not present

## 2018-06-13 DIAGNOSIS — M791 Myalgia, unspecified site: Secondary | ICD-10-CM | POA: Diagnosis not present

## 2018-06-17 DIAGNOSIS — M720 Palmar fascial fibromatosis [Dupuytren]: Secondary | ICD-10-CM | POA: Diagnosis not present

## 2018-06-18 DIAGNOSIS — D485 Neoplasm of uncertain behavior of skin: Secondary | ICD-10-CM | POA: Diagnosis not present

## 2018-06-18 DIAGNOSIS — L570 Actinic keratosis: Secondary | ICD-10-CM | POA: Diagnosis not present

## 2018-06-18 DIAGNOSIS — Z85828 Personal history of other malignant neoplasm of skin: Secondary | ICD-10-CM | POA: Diagnosis not present

## 2018-06-19 DIAGNOSIS — M720 Palmar fascial fibromatosis [Dupuytren]: Secondary | ICD-10-CM | POA: Diagnosis not present

## 2018-06-19 DIAGNOSIS — M25642 Stiffness of left hand, not elsewhere classified: Secondary | ICD-10-CM | POA: Diagnosis not present

## 2018-06-25 DIAGNOSIS — M25642 Stiffness of left hand, not elsewhere classified: Secondary | ICD-10-CM | POA: Diagnosis not present

## 2018-06-25 DIAGNOSIS — M720 Palmar fascial fibromatosis [Dupuytren]: Secondary | ICD-10-CM | POA: Diagnosis not present

## 2018-07-02 DIAGNOSIS — M25642 Stiffness of left hand, not elsewhere classified: Secondary | ICD-10-CM | POA: Diagnosis not present

## 2018-07-02 DIAGNOSIS — M720 Palmar fascial fibromatosis [Dupuytren]: Secondary | ICD-10-CM | POA: Diagnosis not present

## 2018-07-05 DIAGNOSIS — Z23 Encounter for immunization: Secondary | ICD-10-CM | POA: Diagnosis not present

## 2018-07-05 DIAGNOSIS — Z1389 Encounter for screening for other disorder: Secondary | ICD-10-CM | POA: Diagnosis not present

## 2018-07-05 DIAGNOSIS — Z0000 Encounter for general adult medical examination without abnormal findings: Secondary | ICD-10-CM | POA: Diagnosis not present

## 2018-07-05 DIAGNOSIS — I10 Essential (primary) hypertension: Secondary | ICD-10-CM | POA: Diagnosis not present

## 2018-07-05 DIAGNOSIS — E039 Hypothyroidism, unspecified: Secondary | ICD-10-CM | POA: Diagnosis not present

## 2018-07-05 DIAGNOSIS — J449 Chronic obstructive pulmonary disease, unspecified: Secondary | ICD-10-CM | POA: Diagnosis not present

## 2018-07-09 DIAGNOSIS — M720 Palmar fascial fibromatosis [Dupuytren]: Secondary | ICD-10-CM | POA: Diagnosis not present

## 2018-07-09 DIAGNOSIS — M25642 Stiffness of left hand, not elsewhere classified: Secondary | ICD-10-CM | POA: Diagnosis not present

## 2018-07-16 DIAGNOSIS — M720 Palmar fascial fibromatosis [Dupuytren]: Secondary | ICD-10-CM | POA: Diagnosis not present

## 2018-07-16 DIAGNOSIS — M25642 Stiffness of left hand, not elsewhere classified: Secondary | ICD-10-CM | POA: Diagnosis not present

## 2018-07-22 DIAGNOSIS — M720 Palmar fascial fibromatosis [Dupuytren]: Secondary | ICD-10-CM | POA: Diagnosis not present

## 2018-07-23 DIAGNOSIS — M720 Palmar fascial fibromatosis [Dupuytren]: Secondary | ICD-10-CM | POA: Diagnosis not present

## 2018-07-23 DIAGNOSIS — M25642 Stiffness of left hand, not elsewhere classified: Secondary | ICD-10-CM | POA: Diagnosis not present

## 2018-07-24 DIAGNOSIS — M9903 Segmental and somatic dysfunction of lumbar region: Secondary | ICD-10-CM | POA: Diagnosis not present

## 2018-07-24 DIAGNOSIS — Z23 Encounter for immunization: Secondary | ICD-10-CM | POA: Diagnosis not present

## 2018-07-24 DIAGNOSIS — M545 Low back pain: Secondary | ICD-10-CM | POA: Diagnosis not present

## 2018-07-24 DIAGNOSIS — M542 Cervicalgia: Secondary | ICD-10-CM | POA: Diagnosis not present

## 2018-07-24 DIAGNOSIS — M9902 Segmental and somatic dysfunction of thoracic region: Secondary | ICD-10-CM | POA: Diagnosis not present

## 2018-07-24 DIAGNOSIS — M546 Pain in thoracic spine: Secondary | ICD-10-CM | POA: Diagnosis not present

## 2018-07-24 DIAGNOSIS — M9904 Segmental and somatic dysfunction of sacral region: Secondary | ICD-10-CM | POA: Diagnosis not present

## 2018-07-24 DIAGNOSIS — M791 Myalgia, unspecified site: Secondary | ICD-10-CM | POA: Diagnosis not present

## 2018-07-24 DIAGNOSIS — M9901 Segmental and somatic dysfunction of cervical region: Secondary | ICD-10-CM | POA: Diagnosis not present

## 2018-08-06 DIAGNOSIS — M25642 Stiffness of left hand, not elsewhere classified: Secondary | ICD-10-CM | POA: Diagnosis not present

## 2018-08-06 DIAGNOSIS — Z853 Personal history of malignant neoplasm of breast: Secondary | ICD-10-CM | POA: Diagnosis not present

## 2018-08-06 DIAGNOSIS — I10 Essential (primary) hypertension: Secondary | ICD-10-CM | POA: Diagnosis not present

## 2018-08-06 DIAGNOSIS — E039 Hypothyroidism, unspecified: Secondary | ICD-10-CM | POA: Diagnosis not present

## 2018-08-06 DIAGNOSIS — F4321 Adjustment disorder with depressed mood: Secondary | ICD-10-CM | POA: Diagnosis not present

## 2018-08-06 DIAGNOSIS — J449 Chronic obstructive pulmonary disease, unspecified: Secondary | ICD-10-CM | POA: Diagnosis not present

## 2018-08-06 DIAGNOSIS — M720 Palmar fascial fibromatosis [Dupuytren]: Secondary | ICD-10-CM | POA: Diagnosis not present

## 2018-08-13 DIAGNOSIS — M25642 Stiffness of left hand, not elsewhere classified: Secondary | ICD-10-CM | POA: Diagnosis not present

## 2018-08-13 DIAGNOSIS — M720 Palmar fascial fibromatosis [Dupuytren]: Secondary | ICD-10-CM | POA: Diagnosis not present

## 2018-08-17 DIAGNOSIS — S81812A Laceration without foreign body, left lower leg, initial encounter: Secondary | ICD-10-CM | POA: Diagnosis not present

## 2018-08-17 DIAGNOSIS — S80811A Abrasion, right lower leg, initial encounter: Secondary | ICD-10-CM | POA: Diagnosis not present

## 2018-08-17 DIAGNOSIS — S8992XA Unspecified injury of left lower leg, initial encounter: Secondary | ICD-10-CM | POA: Diagnosis not present

## 2018-08-17 DIAGNOSIS — S0181XA Laceration without foreign body of other part of head, initial encounter: Secondary | ICD-10-CM | POA: Diagnosis not present

## 2018-08-17 DIAGNOSIS — M79662 Pain in left lower leg: Secondary | ICD-10-CM | POA: Diagnosis not present

## 2018-08-17 DIAGNOSIS — G8911 Acute pain due to trauma: Secondary | ICD-10-CM | POA: Diagnosis not present

## 2018-08-17 DIAGNOSIS — Z23 Encounter for immunization: Secondary | ICD-10-CM | POA: Diagnosis not present

## 2018-08-23 DIAGNOSIS — F4321 Adjustment disorder with depressed mood: Secondary | ICD-10-CM | POA: Diagnosis not present

## 2018-08-23 DIAGNOSIS — E039 Hypothyroidism, unspecified: Secondary | ICD-10-CM | POA: Diagnosis not present

## 2018-08-23 DIAGNOSIS — S81812A Laceration without foreign body, left lower leg, initial encounter: Secondary | ICD-10-CM | POA: Diagnosis not present

## 2018-08-26 DIAGNOSIS — M25642 Stiffness of left hand, not elsewhere classified: Secondary | ICD-10-CM | POA: Diagnosis not present

## 2018-08-26 DIAGNOSIS — M720 Palmar fascial fibromatosis [Dupuytren]: Secondary | ICD-10-CM | POA: Diagnosis not present

## 2018-10-28 DIAGNOSIS — M9901 Segmental and somatic dysfunction of cervical region: Secondary | ICD-10-CM | POA: Diagnosis not present

## 2018-10-28 DIAGNOSIS — M545 Low back pain: Secondary | ICD-10-CM | POA: Diagnosis not present

## 2018-10-28 DIAGNOSIS — M791 Myalgia, unspecified site: Secondary | ICD-10-CM | POA: Diagnosis not present

## 2018-10-28 DIAGNOSIS — M546 Pain in thoracic spine: Secondary | ICD-10-CM | POA: Diagnosis not present

## 2018-10-28 DIAGNOSIS — M542 Cervicalgia: Secondary | ICD-10-CM | POA: Diagnosis not present

## 2018-10-28 DIAGNOSIS — M9904 Segmental and somatic dysfunction of sacral region: Secondary | ICD-10-CM | POA: Diagnosis not present

## 2018-10-28 DIAGNOSIS — M9903 Segmental and somatic dysfunction of lumbar region: Secondary | ICD-10-CM | POA: Diagnosis not present

## 2018-10-28 DIAGNOSIS — M9902 Segmental and somatic dysfunction of thoracic region: Secondary | ICD-10-CM | POA: Diagnosis not present

## 2018-10-29 DIAGNOSIS — Z85828 Personal history of other malignant neoplasm of skin: Secondary | ICD-10-CM | POA: Diagnosis not present

## 2018-10-29 DIAGNOSIS — L905 Scar conditions and fibrosis of skin: Secondary | ICD-10-CM | POA: Diagnosis not present

## 2018-10-29 DIAGNOSIS — L578 Other skin changes due to chronic exposure to nonionizing radiation: Secondary | ICD-10-CM | POA: Diagnosis not present

## 2018-10-30 DIAGNOSIS — E039 Hypothyroidism, unspecified: Secondary | ICD-10-CM | POA: Diagnosis not present

## 2018-10-30 DIAGNOSIS — J449 Chronic obstructive pulmonary disease, unspecified: Secondary | ICD-10-CM | POA: Diagnosis not present

## 2018-10-30 DIAGNOSIS — F4321 Adjustment disorder with depressed mood: Secondary | ICD-10-CM | POA: Diagnosis not present

## 2018-10-30 DIAGNOSIS — Z853 Personal history of malignant neoplasm of breast: Secondary | ICD-10-CM | POA: Diagnosis not present

## 2018-10-30 DIAGNOSIS — I10 Essential (primary) hypertension: Secondary | ICD-10-CM | POA: Diagnosis not present

## 2019-01-01 DIAGNOSIS — F4321 Adjustment disorder with depressed mood: Secondary | ICD-10-CM | POA: Diagnosis not present

## 2019-01-01 DIAGNOSIS — J449 Chronic obstructive pulmonary disease, unspecified: Secondary | ICD-10-CM | POA: Diagnosis not present

## 2019-01-01 DIAGNOSIS — E039 Hypothyroidism, unspecified: Secondary | ICD-10-CM | POA: Diagnosis not present

## 2019-01-01 DIAGNOSIS — Z853 Personal history of malignant neoplasm of breast: Secondary | ICD-10-CM | POA: Diagnosis not present

## 2019-01-01 DIAGNOSIS — I10 Essential (primary) hypertension: Secondary | ICD-10-CM | POA: Diagnosis not present

## 2019-02-16 DIAGNOSIS — Z853 Personal history of malignant neoplasm of breast: Secondary | ICD-10-CM | POA: Diagnosis not present

## 2019-02-16 DIAGNOSIS — I10 Essential (primary) hypertension: Secondary | ICD-10-CM | POA: Diagnosis not present

## 2019-02-16 DIAGNOSIS — F4321 Adjustment disorder with depressed mood: Secondary | ICD-10-CM | POA: Diagnosis not present

## 2019-02-16 DIAGNOSIS — J449 Chronic obstructive pulmonary disease, unspecified: Secondary | ICD-10-CM | POA: Diagnosis not present

## 2019-02-16 DIAGNOSIS — E039 Hypothyroidism, unspecified: Secondary | ICD-10-CM | POA: Diagnosis not present

## 2019-03-12 DIAGNOSIS — J3489 Other specified disorders of nose and nasal sinuses: Secondary | ICD-10-CM | POA: Diagnosis not present

## 2019-03-12 DIAGNOSIS — F4321 Adjustment disorder with depressed mood: Secondary | ICD-10-CM | POA: Diagnosis not present

## 2019-03-12 DIAGNOSIS — F411 Generalized anxiety disorder: Secondary | ICD-10-CM | POA: Diagnosis not present

## 2019-03-12 DIAGNOSIS — J449 Chronic obstructive pulmonary disease, unspecified: Secondary | ICD-10-CM | POA: Diagnosis not present

## 2019-03-12 DIAGNOSIS — G4700 Insomnia, unspecified: Secondary | ICD-10-CM | POA: Diagnosis not present

## 2019-03-12 DIAGNOSIS — I10 Essential (primary) hypertension: Secondary | ICD-10-CM | POA: Diagnosis not present

## 2019-04-01 DIAGNOSIS — F4321 Adjustment disorder with depressed mood: Secondary | ICD-10-CM | POA: Diagnosis not present

## 2019-04-01 DIAGNOSIS — I10 Essential (primary) hypertension: Secondary | ICD-10-CM | POA: Diagnosis not present

## 2019-04-01 DIAGNOSIS — J449 Chronic obstructive pulmonary disease, unspecified: Secondary | ICD-10-CM | POA: Diagnosis not present

## 2019-04-01 DIAGNOSIS — E039 Hypothyroidism, unspecified: Secondary | ICD-10-CM | POA: Diagnosis not present

## 2019-04-01 DIAGNOSIS — Z853 Personal history of malignant neoplasm of breast: Secondary | ICD-10-CM | POA: Diagnosis not present

## 2019-04-11 DIAGNOSIS — H2513 Age-related nuclear cataract, bilateral: Secondary | ICD-10-CM | POA: Diagnosis not present

## 2019-04-11 DIAGNOSIS — H5202 Hypermetropia, left eye: Secondary | ICD-10-CM | POA: Diagnosis not present

## 2019-04-11 DIAGNOSIS — H5211 Myopia, right eye: Secondary | ICD-10-CM | POA: Diagnosis not present

## 2019-04-11 DIAGNOSIS — H401411 Capsular glaucoma with pseudoexfoliation of lens, right eye, mild stage: Secondary | ICD-10-CM | POA: Diagnosis not present

## 2019-04-21 DIAGNOSIS — Z1231 Encounter for screening mammogram for malignant neoplasm of breast: Secondary | ICD-10-CM | POA: Diagnosis not present

## 2019-04-21 DIAGNOSIS — Z853 Personal history of malignant neoplasm of breast: Secondary | ICD-10-CM | POA: Diagnosis not present

## 2019-04-30 DIAGNOSIS — H2513 Age-related nuclear cataract, bilateral: Secondary | ICD-10-CM | POA: Diagnosis not present

## 2019-04-30 DIAGNOSIS — H401412 Capsular glaucoma with pseudoexfoliation of lens, right eye, moderate stage: Secondary | ICD-10-CM | POA: Diagnosis not present

## 2019-05-02 DIAGNOSIS — L905 Scar conditions and fibrosis of skin: Secondary | ICD-10-CM | POA: Diagnosis not present

## 2019-05-02 DIAGNOSIS — D1801 Hemangioma of skin and subcutaneous tissue: Secondary | ICD-10-CM | POA: Diagnosis not present

## 2019-05-02 DIAGNOSIS — D225 Melanocytic nevi of trunk: Secondary | ICD-10-CM | POA: Diagnosis not present

## 2019-05-02 DIAGNOSIS — Z85828 Personal history of other malignant neoplasm of skin: Secondary | ICD-10-CM | POA: Diagnosis not present

## 2019-05-02 DIAGNOSIS — L853 Xerosis cutis: Secondary | ICD-10-CM | POA: Diagnosis not present

## 2019-05-02 DIAGNOSIS — L814 Other melanin hyperpigmentation: Secondary | ICD-10-CM | POA: Diagnosis not present

## 2019-05-02 DIAGNOSIS — L821 Other seborrheic keratosis: Secondary | ICD-10-CM | POA: Diagnosis not present

## 2019-05-02 DIAGNOSIS — L570 Actinic keratosis: Secondary | ICD-10-CM | POA: Diagnosis not present

## 2019-05-07 DIAGNOSIS — J310 Chronic rhinitis: Secondary | ICD-10-CM | POA: Diagnosis not present

## 2019-05-07 DIAGNOSIS — Z1211 Encounter for screening for malignant neoplasm of colon: Secondary | ICD-10-CM | POA: Diagnosis not present

## 2019-05-07 DIAGNOSIS — E7800 Pure hypercholesterolemia, unspecified: Secondary | ICD-10-CM | POA: Diagnosis not present

## 2019-05-07 DIAGNOSIS — J3489 Other specified disorders of nose and nasal sinuses: Secondary | ICD-10-CM | POA: Diagnosis not present

## 2019-05-07 DIAGNOSIS — I10 Essential (primary) hypertension: Secondary | ICD-10-CM | POA: Diagnosis not present

## 2019-05-07 DIAGNOSIS — Z0000 Encounter for general adult medical examination without abnormal findings: Secondary | ICD-10-CM | POA: Diagnosis not present

## 2019-05-07 DIAGNOSIS — Z1389 Encounter for screening for other disorder: Secondary | ICD-10-CM | POA: Diagnosis not present

## 2019-05-07 DIAGNOSIS — E039 Hypothyroidism, unspecified: Secondary | ICD-10-CM | POA: Diagnosis not present

## 2019-05-22 DIAGNOSIS — H2511 Age-related nuclear cataract, right eye: Secondary | ICD-10-CM | POA: Diagnosis not present

## 2019-05-22 DIAGNOSIS — H25811 Combined forms of age-related cataract, right eye: Secondary | ICD-10-CM | POA: Diagnosis not present

## 2019-06-05 DIAGNOSIS — H2512 Age-related nuclear cataract, left eye: Secondary | ICD-10-CM | POA: Diagnosis not present

## 2019-06-05 DIAGNOSIS — H25812 Combined forms of age-related cataract, left eye: Secondary | ICD-10-CM | POA: Diagnosis not present

## 2019-06-20 DIAGNOSIS — Z5181 Encounter for therapeutic drug level monitoring: Secondary | ICD-10-CM | POA: Diagnosis not present

## 2019-06-20 DIAGNOSIS — E7800 Pure hypercholesterolemia, unspecified: Secondary | ICD-10-CM | POA: Diagnosis not present

## 2019-07-08 DIAGNOSIS — K5730 Diverticulosis of large intestine without perforation or abscess without bleeding: Secondary | ICD-10-CM | POA: Diagnosis not present

## 2019-07-08 DIAGNOSIS — D123 Benign neoplasm of transverse colon: Secondary | ICD-10-CM | POA: Diagnosis not present

## 2019-07-08 DIAGNOSIS — Z86010 Personal history of colonic polyps: Secondary | ICD-10-CM | POA: Diagnosis not present

## 2019-07-08 DIAGNOSIS — D120 Benign neoplasm of cecum: Secondary | ICD-10-CM | POA: Diagnosis not present

## 2019-07-09 DIAGNOSIS — D485 Neoplasm of uncertain behavior of skin: Secondary | ICD-10-CM | POA: Diagnosis not present

## 2019-07-09 DIAGNOSIS — Z85828 Personal history of other malignant neoplasm of skin: Secondary | ICD-10-CM | POA: Diagnosis not present

## 2019-07-09 DIAGNOSIS — L570 Actinic keratosis: Secondary | ICD-10-CM | POA: Diagnosis not present

## 2019-07-09 DIAGNOSIS — C44729 Squamous cell carcinoma of skin of left lower limb, including hip: Secondary | ICD-10-CM | POA: Diagnosis not present

## 2019-07-11 DIAGNOSIS — D120 Benign neoplasm of cecum: Secondary | ICD-10-CM | POA: Diagnosis not present

## 2019-07-11 DIAGNOSIS — D123 Benign neoplasm of transverse colon: Secondary | ICD-10-CM | POA: Diagnosis not present

## 2019-07-18 DIAGNOSIS — Z853 Personal history of malignant neoplasm of breast: Secondary | ICD-10-CM | POA: Diagnosis not present

## 2019-07-18 DIAGNOSIS — F4321 Adjustment disorder with depressed mood: Secondary | ICD-10-CM | POA: Diagnosis not present

## 2019-07-18 DIAGNOSIS — C44729 Squamous cell carcinoma of skin of left lower limb, including hip: Secondary | ICD-10-CM | POA: Diagnosis not present

## 2019-07-18 DIAGNOSIS — E7800 Pure hypercholesterolemia, unspecified: Secondary | ICD-10-CM | POA: Diagnosis not present

## 2019-07-18 DIAGNOSIS — E039 Hypothyroidism, unspecified: Secondary | ICD-10-CM | POA: Diagnosis not present

## 2019-07-18 DIAGNOSIS — I10 Essential (primary) hypertension: Secondary | ICD-10-CM | POA: Diagnosis not present

## 2019-07-18 DIAGNOSIS — Z85828 Personal history of other malignant neoplasm of skin: Secondary | ICD-10-CM | POA: Diagnosis not present

## 2019-07-18 DIAGNOSIS — J449 Chronic obstructive pulmonary disease, unspecified: Secondary | ICD-10-CM | POA: Diagnosis not present

## 2019-07-29 DIAGNOSIS — H401411 Capsular glaucoma with pseudoexfoliation of lens, right eye, mild stage: Secondary | ICD-10-CM | POA: Diagnosis not present

## 2019-07-29 DIAGNOSIS — H01001 Unspecified blepharitis right upper eyelid: Secondary | ICD-10-CM | POA: Diagnosis not present

## 2019-08-12 DIAGNOSIS — H0012 Chalazion right lower eyelid: Secondary | ICD-10-CM | POA: Diagnosis not present

## 2019-08-26 DIAGNOSIS — E7800 Pure hypercholesterolemia, unspecified: Secondary | ICD-10-CM | POA: Diagnosis not present

## 2019-08-26 DIAGNOSIS — Z5181 Encounter for therapeutic drug level monitoring: Secondary | ICD-10-CM | POA: Diagnosis not present

## 2019-08-26 DIAGNOSIS — Z23 Encounter for immunization: Secondary | ICD-10-CM | POA: Diagnosis not present

## 2019-11-27 DIAGNOSIS — Z23 Encounter for immunization: Secondary | ICD-10-CM | POA: Diagnosis not present

## 2019-12-26 DIAGNOSIS — Z23 Encounter for immunization: Secondary | ICD-10-CM | POA: Diagnosis not present

## 2020-01-06 DIAGNOSIS — M546 Pain in thoracic spine: Secondary | ICD-10-CM | POA: Diagnosis not present

## 2020-01-06 DIAGNOSIS — M9902 Segmental and somatic dysfunction of thoracic region: Secondary | ICD-10-CM | POA: Diagnosis not present

## 2020-01-06 DIAGNOSIS — M47816 Spondylosis without myelopathy or radiculopathy, lumbar region: Secondary | ICD-10-CM | POA: Diagnosis not present

## 2020-01-06 DIAGNOSIS — M9903 Segmental and somatic dysfunction of lumbar region: Secondary | ICD-10-CM | POA: Diagnosis not present

## 2020-01-06 DIAGNOSIS — M62830 Muscle spasm of back: Secondary | ICD-10-CM | POA: Diagnosis not present

## 2020-01-08 DIAGNOSIS — J449 Chronic obstructive pulmonary disease, unspecified: Secondary | ICD-10-CM | POA: Diagnosis not present

## 2020-01-08 DIAGNOSIS — E039 Hypothyroidism, unspecified: Secondary | ICD-10-CM | POA: Diagnosis not present

## 2020-01-08 DIAGNOSIS — F4321 Adjustment disorder with depressed mood: Secondary | ICD-10-CM | POA: Diagnosis not present

## 2020-01-08 DIAGNOSIS — Z853 Personal history of malignant neoplasm of breast: Secondary | ICD-10-CM | POA: Diagnosis not present

## 2020-01-08 DIAGNOSIS — E7800 Pure hypercholesterolemia, unspecified: Secondary | ICD-10-CM | POA: Diagnosis not present

## 2020-01-08 DIAGNOSIS — I10 Essential (primary) hypertension: Secondary | ICD-10-CM | POA: Diagnosis not present

## 2020-01-12 DIAGNOSIS — M62830 Muscle spasm of back: Secondary | ICD-10-CM | POA: Diagnosis not present

## 2020-01-12 DIAGNOSIS — M546 Pain in thoracic spine: Secondary | ICD-10-CM | POA: Diagnosis not present

## 2020-01-12 DIAGNOSIS — M9903 Segmental and somatic dysfunction of lumbar region: Secondary | ICD-10-CM | POA: Diagnosis not present

## 2020-01-12 DIAGNOSIS — M9902 Segmental and somatic dysfunction of thoracic region: Secondary | ICD-10-CM | POA: Diagnosis not present

## 2020-01-12 DIAGNOSIS — M47816 Spondylosis without myelopathy or radiculopathy, lumbar region: Secondary | ICD-10-CM | POA: Diagnosis not present

## 2020-02-03 DIAGNOSIS — J449 Chronic obstructive pulmonary disease, unspecified: Secondary | ICD-10-CM | POA: Diagnosis not present

## 2020-02-03 DIAGNOSIS — I10 Essential (primary) hypertension: Secondary | ICD-10-CM | POA: Diagnosis not present

## 2020-02-03 DIAGNOSIS — G4700 Insomnia, unspecified: Secondary | ICD-10-CM | POA: Diagnosis not present

## 2020-02-03 DIAGNOSIS — E7800 Pure hypercholesterolemia, unspecified: Secondary | ICD-10-CM | POA: Diagnosis not present

## 2020-02-03 DIAGNOSIS — Z853 Personal history of malignant neoplasm of breast: Secondary | ICD-10-CM | POA: Diagnosis not present

## 2020-02-03 DIAGNOSIS — F4321 Adjustment disorder with depressed mood: Secondary | ICD-10-CM | POA: Diagnosis not present

## 2020-02-03 DIAGNOSIS — E039 Hypothyroidism, unspecified: Secondary | ICD-10-CM | POA: Diagnosis not present

## 2020-02-16 DIAGNOSIS — M546 Pain in thoracic spine: Secondary | ICD-10-CM | POA: Diagnosis not present

## 2020-02-16 DIAGNOSIS — M9902 Segmental and somatic dysfunction of thoracic region: Secondary | ICD-10-CM | POA: Diagnosis not present

## 2020-02-16 DIAGNOSIS — M9903 Segmental and somatic dysfunction of lumbar region: Secondary | ICD-10-CM | POA: Diagnosis not present

## 2020-02-16 DIAGNOSIS — M47816 Spondylosis without myelopathy or radiculopathy, lumbar region: Secondary | ICD-10-CM | POA: Diagnosis not present

## 2020-02-16 DIAGNOSIS — M62830 Muscle spasm of back: Secondary | ICD-10-CM | POA: Diagnosis not present

## 2020-03-01 DIAGNOSIS — M9903 Segmental and somatic dysfunction of lumbar region: Secondary | ICD-10-CM | POA: Diagnosis not present

## 2020-03-01 DIAGNOSIS — M62830 Muscle spasm of back: Secondary | ICD-10-CM | POA: Diagnosis not present

## 2020-03-01 DIAGNOSIS — M546 Pain in thoracic spine: Secondary | ICD-10-CM | POA: Diagnosis not present

## 2020-03-01 DIAGNOSIS — M9902 Segmental and somatic dysfunction of thoracic region: Secondary | ICD-10-CM | POA: Diagnosis not present

## 2020-03-01 DIAGNOSIS — M47816 Spondylosis without myelopathy or radiculopathy, lumbar region: Secondary | ICD-10-CM | POA: Diagnosis not present

## 2020-03-10 DIAGNOSIS — Z5181 Encounter for therapeutic drug level monitoring: Secondary | ICD-10-CM | POA: Diagnosis not present

## 2020-03-10 DIAGNOSIS — R030 Elevated blood-pressure reading, without diagnosis of hypertension: Secondary | ICD-10-CM | POA: Diagnosis not present

## 2020-03-10 DIAGNOSIS — R1032 Left lower quadrant pain: Secondary | ICD-10-CM | POA: Diagnosis not present

## 2020-03-10 DIAGNOSIS — J449 Chronic obstructive pulmonary disease, unspecified: Secondary | ICD-10-CM | POA: Diagnosis not present

## 2020-03-10 DIAGNOSIS — E7800 Pure hypercholesterolemia, unspecified: Secondary | ICD-10-CM | POA: Diagnosis not present

## 2020-03-10 DIAGNOSIS — R1031 Right lower quadrant pain: Secondary | ICD-10-CM | POA: Diagnosis not present

## 2020-03-12 DIAGNOSIS — F4321 Adjustment disorder with depressed mood: Secondary | ICD-10-CM | POA: Diagnosis not present

## 2020-03-12 DIAGNOSIS — Z853 Personal history of malignant neoplasm of breast: Secondary | ICD-10-CM | POA: Diagnosis not present

## 2020-03-12 DIAGNOSIS — K5900 Constipation, unspecified: Secondary | ICD-10-CM | POA: Diagnosis not present

## 2020-03-12 DIAGNOSIS — G4700 Insomnia, unspecified: Secondary | ICD-10-CM | POA: Diagnosis not present

## 2020-03-12 DIAGNOSIS — E039 Hypothyroidism, unspecified: Secondary | ICD-10-CM | POA: Diagnosis not present

## 2020-03-12 DIAGNOSIS — J449 Chronic obstructive pulmonary disease, unspecified: Secondary | ICD-10-CM | POA: Diagnosis not present

## 2020-03-12 DIAGNOSIS — E7800 Pure hypercholesterolemia, unspecified: Secondary | ICD-10-CM | POA: Diagnosis not present

## 2020-03-12 DIAGNOSIS — I10 Essential (primary) hypertension: Secondary | ICD-10-CM | POA: Diagnosis not present

## 2020-03-12 DIAGNOSIS — K5792 Diverticulitis of intestine, part unspecified, without perforation or abscess without bleeding: Secondary | ICD-10-CM | POA: Diagnosis not present

## 2020-03-17 ENCOUNTER — Ambulatory Visit
Admission: RE | Admit: 2020-03-17 | Discharge: 2020-03-17 | Disposition: A | Discharge status: Home / Self Care | Payer: Medicare Other | Source: Ambulatory Visit | Attending: Internal Medicine | Admitting: Internal Medicine

## 2020-03-17 ENCOUNTER — Other Ambulatory Visit: Payer: Self-pay | Admitting: Internal Medicine

## 2020-03-17 DIAGNOSIS — R0789 Other chest pain: Secondary | ICD-10-CM

## 2020-03-25 ENCOUNTER — Other Ambulatory Visit: Payer: Self-pay

## 2020-03-25 ENCOUNTER — Ambulatory Visit (INDEPENDENT_AMBULATORY_CARE_PROVIDER_SITE_OTHER): Payer: Medicare Other | Admitting: Internal Medicine

## 2020-03-25 ENCOUNTER — Encounter: Payer: Self-pay | Admitting: Internal Medicine

## 2020-03-25 VITALS — BP 160/66 | HR 80 | Temp 97.8°F | Ht 65.0 in | Wt 151.4 lb

## 2020-03-25 DIAGNOSIS — Z87891 Personal history of nicotine dependence: Secondary | ICD-10-CM

## 2020-03-25 DIAGNOSIS — J410 Simple chronic bronchitis: Secondary | ICD-10-CM

## 2020-03-25 MED ORDER — ALBUTEROL SULFATE HFA 108 (90 BASE) MCG/ACT IN AERS
2.0000 | INHALATION_SPRAY | Freq: Four times a day (QID) | RESPIRATORY_TRACT | 11 refills | Status: DC | PRN
Start: 2020-03-25 — End: 2023-07-19

## 2020-03-25 NOTE — Patient Instructions (Addendum)
The patient should have follow up scheduled in 1 year  Prior to next visit patient should have: Full set of PFTs  Flonase - 1 spray on each side of your nose twice a day for first week, then 1 spray on each side.   Instructions for use:  If you also use a saline nasal spray or rinse, use that first.  Position the head with the chin slightly tucked. Use the right hand to spray into the left nostril and the right hand to spray into the left nostril.   Point the bottle away from the septum of your nose (cartilage that divides the two sides of your nose).   Hold the nostril closed on the opposite side from where you will spray  Spray once and gently sniff to pull the medicine into the higher parts of your nose.  Don't sniff too hard as the medicine will drain down the back of your throat instead.  Repeat with a second spray on the same side if prescribed.  Repeat on the other side of your nose.   Take the albuterol rescue inhaler every 4 to 6 hours as needed for wheezing or shortness of breath. You can also take it 15 minutes before exercise or exertional activity. Side effects include heart racing or pounding, jitters or anxiety. If you have a history of an irregular heart rhythm, it can make this worse. Can also give some patients a hard time sleeping.  To inhale the aerosol using an inhaler, follow these steps:  1. Remove the protective dust cap from the end of the mouthpiece. If the dust cap was not placed on the mouthpiece, check the mouthpiece for dirt or other objects. Be sure that the canister is fully and firmly inserted in the mouthpiece. 2. If you are using the inhaler for the first time or if you have not used the inhaler in more than 14 days, you will need to prime it. You may also need to prime the inhaler if it has been dropped. Ask your pharmacist or check the manufacturer's information if this happens. To prime the inhaler, shake it well and then press down on the canister 4  times to release 4 sprays into the air, away from your face. Be careful not to get albuterol in your eyes. 3. Shake the inhaler well. 4. Breathe out as completely as possible through your mouth. 4. Hold the canister with the mouthpiece on the bottom, facing you and the canister pointing upward. Place the open end of the mouthpiece into your mouth. Close your lips tightly around the mouthpiece. 6. Breathe in slowly and deeply through the mouthpiece.At the same time, press down once on the container to spray the medication into your mouth. 7. Try to hold your breath for 10 seconds. remove the inhaler, and breathe out slowly. 8. If you were told to use 2 puffs, wait 1 minute and then repeat steps 3-7. 9. Replace the protective cap on the inhaler. 10. Clean your inhaler regularly. Follow the manufacturer's directions carefully and ask your doctor or pharmacist if you have any questions about cleaning your inhaler.  Check the back of the inhaler to keep track of the total number of doses left on the inhaler.

## 2020-03-25 NOTE — Progress Notes (Signed)
Kara Newman    [id number]    Apr 17, 1946  Primary Care Physician:Griffin, Jenny Reichmann, MD  Referring Physician: Johna Roles, [place] [address] [place],  [place] [address] Reason for Consultation: copd Date of Consultation: 03/25/2020  Chief complaint:   Chief Complaint  Patient presents with  . Consult    COPD, hills and steps take her breath.  Dry cough, sneezing     HPI: Kara Newman is a 74 y.o. woman with history of tobacco use and COPD diagnosed in 2006.  Here today for new patient evaluation.   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   Notes shortness of breath that is worsening over the last year. Currently on symbicort and spiriva. Having more trouble golfing   For many years only took symbicort once a day, this year taking twice a day which helps. Takes spriva once/day. No thrush.  Takes albuterol inhaler sparingly almost no use.  Her husband notes she might be nervous to become dependent on this medication.  Never had a flare of COPD requiring hospitalization of prednisone/abx as an outpatient. Has chronic rhinitis and takes zyrtec and zyrtec-D.  Husband notes frequent coughing in her sleep and that she sounds congested. She has chronic nasal congestion. She doesn't take nasal sprays because they make her throw up.    She and her husband spend half the year in [place] during winter, and half the year in in [place] in summer.  Social history:  Social History   Occupational History  . Not on file  Tobacco Use  . Smoking status: Former Smoker    Packs/day: 1.00    Years: 38.00    Pack years: 38.00    Types: Cigarettes    Quit date: 11/06/2000    Years since quitting: 19.3  . Smokeless tobacco: Never Used  Substance and Sexual Activity  . Alcohol use: Not on file  . Drug use: Not on file  . Sexual activity: Not on file    Relevant family history:  Family History  Problem Relation Age of  Onset  . Lung cancer Mother     Past Medical History:  Diagnosis Date  . Breast cancer ([facility])    had left mastectomy.   Marland Kitchen COPD (chronic obstructive pulmonary disease) ([facility])     Past Surgical History:  Procedure Laterality Date  . MASTECTOMY       Physical Exam: Blood pressure (!) 160/66, pulse 80, temperature 97.8 F (36.6 C), temperature source Temporal, height 5\' 5"  (1.651 m), weight 151 lb 6.4 oz (68.7 kg), SpO2 93 %. Gen:      No acute distress ENT:  no nasal polyps, mucus membranes moist Lungs:    No increased respiratory effort, symmetric chest wall excursion, clear to auscultation bilaterally, breath sounds are diminished, no wheezes or crackles CV:         Regular rate and rhythm; no murmurs, rubs, or gallops.  No pedal edema Abd:      + bowel sounds; soft, non-tender; no distension MSK: no acute synovitis of DIP or PIP joints, no mechanics hands.  Skin:      Warm and dry; no rashes Neuro: normal speech, no focal facial asymmetry Psych: alert and oriented x3, normal mood and affect   Data Reviewed/Medical Decision Making:  Independent interpretation of tests: Imaging: . Review of patient's chest xray images May 2021 revealed no acute process in the lungs. The  patient's images have been independently reviewed by me.    PFTs: None on file.   Labs:  None on file  Immunization status:  Immunization History  Administered Date(s) Administered  . Influenza, High Dose Seasonal PF 07/26/2018  . Moderna SARS-COVID-2 Vaccination 12/01/2019, 12/29/2019    . I reviewed prior external note(s) from PCP, ENT . I reviewed the result(s) of the labs and imaging as noted above.  . I have ordered PFTs   Assessment:  COPD, progressing History of tobacco use, in remission Chronic Rhinitis, progressing  Plan/Recommendations:  I discussed with Ms. Glasby at length COPD disease management and progression.  She was interested in hearing more about new inhalers.  Since she is  only taking Symbicort once a day and not taking albuterol as needed at all right now we discussed first doing Symbicort twice daily and using albuterol as needed for shortness of breath and prior to exercise such as golfing and walking a long distance.  She is interested in getting a full set of pulmonary function test so we can better understand her baseline lung function especially in the setting of progression of disease.  I have ordered this today.  I advised her to continue Zyrtec for her chronic rhinitis, but to be careful with the Zyrtec-D given her high blood pressure.  I also counseled her on the appropriate technique of Flonase so that she does not have as much gagging.  She quit smoking over 15 years ago she does not currently meet criteria for low-dose CT for lung cancer screening.  We discussed disease management and progression at length today.   I spent 60 minutes in the care of this patient today including pre-charting, chart review, review of results, face-to-face care, coordination of care and communication with consultants etc.).  Return to Care: She would like to follow-up in about a year and will contact us if she needs a sooner.  I let her know we would inform her of her PFT results.   Lenice Llamas, MD [facility] [place]  CC: [place], [place], [place]

## 2020-04-09 DIAGNOSIS — H5212 Myopia, left eye: Secondary | ICD-10-CM | POA: Diagnosis not present

## 2020-04-09 DIAGNOSIS — H401432 Capsular glaucoma with pseudoexfoliation of lens, bilateral, moderate stage: Secondary | ICD-10-CM | POA: Diagnosis not present

## 2020-04-09 DIAGNOSIS — H01002 Unspecified blepharitis right lower eyelid: Secondary | ICD-10-CM | POA: Diagnosis not present

## 2020-04-09 DIAGNOSIS — H01005 Unspecified blepharitis left lower eyelid: Secondary | ICD-10-CM | POA: Diagnosis not present

## 2020-04-26 DIAGNOSIS — Z1231 Encounter for screening mammogram for malignant neoplasm of breast: Secondary | ICD-10-CM | POA: Diagnosis not present

## 2020-05-10 ENCOUNTER — Other Ambulatory Visit (HOSPITAL_COMMUNITY)
Admission: RE | Admit: 2020-05-10 | Discharge: 2020-05-10 | Disposition: A | Discharge status: Home / Self Care | Payer: Medicare Other | Source: Ambulatory Visit | Attending: Internal Medicine | Admitting: Internal Medicine

## 2020-05-10 DIAGNOSIS — Z01812 Encounter for preprocedural laboratory examination: Secondary | ICD-10-CM | POA: Insufficient documentation

## 2020-05-10 DIAGNOSIS — Z20822 Contact with and (suspected) exposure to covid-19: Secondary | ICD-10-CM | POA: Insufficient documentation

## 2020-05-10 LAB — SARS CORONAVIRUS 2 (TAT 6-24 HRS): SARS Coronavirus 2: NEGATIVE

## 2020-05-13 ENCOUNTER — Ambulatory Visit (INDEPENDENT_AMBULATORY_CARE_PROVIDER_SITE_OTHER): Payer: Medicare Other | Admitting: Internal Medicine

## 2020-05-13 ENCOUNTER — Other Ambulatory Visit: Payer: Self-pay

## 2020-05-13 DIAGNOSIS — J410 Simple chronic bronchitis: Secondary | ICD-10-CM

## 2020-05-13 LAB — PULMONARY FUNCTION TEST
DL/VA % pred: 82 %
DL/VA: 3.33 ml/min/mmHg/L
DLCO cor % pred: 69 %
DLCO cor: 14.06 ml/min/mmHg
DLCO unc % pred: 69 %
DLCO unc: 14.06 ml/min/mmHg
FEF 25-75 Post: 0.7 L/sec
FEF 25-75 Pre: 0.44 L/sec
FEF2575-%Change-Post: 59 %
FEF2575-%Pred-Post: 40 %
FEF2575-%Pred-Pre: 25 %
FEV1-%Change-Post: 15 %
FEV1-%Pred-Post: 54 %
FEV1-%Pred-Pre: 47 %
FEV1-Post: 1.24 L
FEV1-Pre: 1.07 L
FEV1FVC-%Change-Post: 7 %
FEV1FVC-%Pred-Pre: 72 %
FEV6-%Change-Post: 11 %
FEV6-%Pred-Post: 74 %
FEV6-%Pred-Pre: 66 %
FEV6-Post: 2.14 L
FEV6-Pre: 1.92 L
FEV6FVC-%Change-Post: 3 %
FEV6FVC-%Pred-Post: 105 %
FEV6FVC-%Pred-Pre: 101 %
FVC-%Change-Post: 8 %
FVC-%Pred-Post: 70 %
FVC-%Pred-Pre: 65 %
FVC-Post: 2.14 L
FVC-Pre: 1.98 L
Post FEV1/FVC ratio: 58 %
Post FEV6/FVC ratio: 100 %
Pre FEV1/FVC ratio: 54 %
Pre FEV6/FVC Ratio: 97 %
RV % pred: 160 %
RV: 3.86 L
TLC % pred: 114 %
TLC: 6.15 L

## 2020-05-13 NOTE — Progress Notes (Signed)
Full PFT performed today. °

## 2020-05-14 ENCOUNTER — Other Ambulatory Visit: Payer: Self-pay | Admitting: Internal Medicine

## 2020-05-14 DIAGNOSIS — M8580 Other specified disorders of bone density and structure, unspecified site: Secondary | ICD-10-CM

## 2020-06-24 DIAGNOSIS — F4321 Adjustment disorder with depressed mood: Secondary | ICD-10-CM | POA: Diagnosis not present

## 2020-06-24 DIAGNOSIS — G4700 Insomnia, unspecified: Secondary | ICD-10-CM | POA: Diagnosis not present

## 2020-06-24 DIAGNOSIS — E039 Hypothyroidism, unspecified: Secondary | ICD-10-CM | POA: Diagnosis not present

## 2020-06-24 DIAGNOSIS — Z853 Personal history of malignant neoplasm of breast: Secondary | ICD-10-CM | POA: Diagnosis not present

## 2020-06-24 DIAGNOSIS — J449 Chronic obstructive pulmonary disease, unspecified: Secondary | ICD-10-CM | POA: Diagnosis not present

## 2020-06-24 DIAGNOSIS — E7800 Pure hypercholesterolemia, unspecified: Secondary | ICD-10-CM | POA: Diagnosis not present

## 2020-06-24 DIAGNOSIS — I10 Essential (primary) hypertension: Secondary | ICD-10-CM | POA: Diagnosis not present

## 2020-07-13 DIAGNOSIS — Z23 Encounter for immunization: Secondary | ICD-10-CM | POA: Diagnosis not present

## 2020-07-16 DIAGNOSIS — D692 Other nonthrombocytopenic purpura: Secondary | ICD-10-CM | POA: Diagnosis not present

## 2020-07-16 DIAGNOSIS — R238 Other skin changes: Secondary | ICD-10-CM | POA: Diagnosis not present

## 2020-07-16 DIAGNOSIS — R5383 Other fatigue: Secondary | ICD-10-CM | POA: Diagnosis not present

## 2020-08-04 ENCOUNTER — Other Ambulatory Visit: Payer: Medicare Other

## 2020-09-19 DIAGNOSIS — Z23 Encounter for immunization: Secondary | ICD-10-CM | POA: Diagnosis not present

## 2020-11-03 DIAGNOSIS — R238 Other skin changes: Secondary | ICD-10-CM | POA: Diagnosis not present

## 2021-03-23 ENCOUNTER — Encounter: Payer: Self-pay | Admitting: Internal Medicine

## 2021-03-23 ENCOUNTER — Ambulatory Visit (INDEPENDENT_AMBULATORY_CARE_PROVIDER_SITE_OTHER): Payer: Medicare Other | Admitting: Internal Medicine

## 2021-03-23 ENCOUNTER — Other Ambulatory Visit: Payer: Self-pay

## 2021-03-23 VITALS — BP 148/78 | HR 74 | Ht 65.0 in | Wt 153.0 lb

## 2021-03-23 DIAGNOSIS — J449 Chronic obstructive pulmonary disease, unspecified: Secondary | ICD-10-CM

## 2021-03-23 DIAGNOSIS — F419 Anxiety disorder, unspecified: Secondary | ICD-10-CM | POA: Insufficient documentation

## 2021-03-23 DIAGNOSIS — K5792 Diverticulitis of intestine, part unspecified, without perforation or abscess without bleeding: Secondary | ICD-10-CM | POA: Insufficient documentation

## 2021-03-23 DIAGNOSIS — J3089 Other allergic rhinitis: Secondary | ICD-10-CM | POA: Insufficient documentation

## 2021-03-23 DIAGNOSIS — I10 Essential (primary) hypertension: Secondary | ICD-10-CM | POA: Insufficient documentation

## 2021-03-23 DIAGNOSIS — Z860100 Personal history of colon polyps, unspecified: Secondary | ICD-10-CM | POA: Insufficient documentation

## 2021-03-23 DIAGNOSIS — N941 Unspecified dyspareunia: Secondary | ICD-10-CM | POA: Insufficient documentation

## 2021-03-23 DIAGNOSIS — N952 Postmenopausal atrophic vaginitis: Secondary | ICD-10-CM | POA: Insufficient documentation

## 2021-03-23 DIAGNOSIS — Z853 Personal history of malignant neoplasm of breast: Secondary | ICD-10-CM | POA: Insufficient documentation

## 2021-03-23 DIAGNOSIS — E039 Hypothyroidism, unspecified: Secondary | ICD-10-CM | POA: Insufficient documentation

## 2021-03-23 DIAGNOSIS — G4700 Insomnia, unspecified: Secondary | ICD-10-CM | POA: Insufficient documentation

## 2021-03-23 DIAGNOSIS — K5900 Constipation, unspecified: Secondary | ICD-10-CM | POA: Insufficient documentation

## 2021-03-23 DIAGNOSIS — E7800 Pure hypercholesterolemia, unspecified: Secondary | ICD-10-CM | POA: Insufficient documentation

## 2021-03-23 DIAGNOSIS — F4321 Adjustment disorder with depressed mood: Secondary | ICD-10-CM | POA: Insufficient documentation

## 2021-03-23 HISTORY — DX: Personal history of colon polyps, unspecified: Z86.0100

## 2021-03-23 MED ORDER — BREZTRI AEROSPHERE 160-9-4.8 MCG/ACT IN AERO: 2.0000 | INHALATION_SPRAY | Freq: Two times a day (BID) | RESPIRATORY_TRACT | 11 refills | Status: DC

## 2021-03-23 NOTE — Progress Notes (Signed)
Kara Newman    [id number]    1946/05/26  Primary Care Physician:Griffin, Jenny Reichmann, MD Date of Appointment: 03/23/2021 Established Patient Visit  Chief complaint:   Chief Complaint  Patient presents with  . Consult    Chronic bronchitis     HPI: Kara Newman is a 75 y.o. woman with severe COPD.  Interval Updates: Here for follow up today after a year.  No hospitalizations or ED visits.  Albuterol use is minimal - she had to use it when [place] [employer].  She still takes symbicort twice a day everyday. Still taking spiriva, but doesn't care for it.   I have reviewed the patient's family social and past medical history and updated as appropriate.   Past Medical History:  Diagnosis Date  . Breast cancer ([facility])    had left mastectomy.   Marland Kitchen COPD (chronic obstructive pulmonary disease) ([facility])   . Endometriosis     Past Surgical History:  Procedure Laterality Date  . ABDOMINAL HYSTERECTOMY    . MASTECTOMY      Family History  Problem Relation Age of Onset  . Lung cancer Mother     Social History   Occupational History  . Not on file  Tobacco Use  . Smoking status: Former Smoker    Packs/day: 1.00    Years: 38.00    Pack years: 38.00    Types: Cigarettes    Quit date: 11/06/2000    Years since quitting: 20.3  . Smokeless tobacco: Never Used  Vaping Use  . Vaping Use: Never used  Substance and Sexual Activity  . Alcohol use: Yes  . Drug use: Never  . Sexual activity: Yes    Birth control/protection: None, Surgical     Physical Exam: Blood pressure (!) 148/78, pulse 74, height 5\' 5"  (1.651 m), weight 153 lb (69.4 kg), SpO2 98 %.  Gen:      No acute distress Lungs:    Diminished bilaterally, no wheezes or crackles CV:         Regular rate and rhythm; no murmurs, rubs, or gallops.  No pedal edema   Data Reviewed: Imaging: I have personally reviewed the chest xray may 2021 which shows no acute cardiopulmonary process.   PFTs:  PFT  Results Latest Ref Rng & Units 05/13/2020  FVC-Pre L 1.98  FVC-Predicted Pre % 65  FVC-Post L 2.14  FVC-Predicted Post % 70  Pre FEV1/FVC % % 54  Post FEV1/FCV % % 58  FEV1-Pre L 1.07  FEV1-Predicted Pre % 47  FEV1-Post L 1.24  DLCO uncorrected ml/min/mmHg 14.06  DLCO UNC% % 69  DLCO corrected ml/min/mmHg 14.06  DLCO COR %Predicted % 69  DLVA Predicted % 82  TLC L 6.15  TLC % Predicted % 114  RV % Predicted % 160   I have personally reviewed the patient's PFTs which show severe emphysema  Labs:  Immunization status: Immunization History  Administered Date(s) Administered  . Influenza Split 08/13/2012  . Influenza, High Dose Seasonal PF 07/26/2018  . Moderna Sars-Covid-2 Vaccination 12/01/2019, 12/29/2019  . Pneumococcal Conjugate-13 07/05/2018  . Pneumococcal Polysaccharide-23 07/22/2009  . Td 08/18/2018    Assessment:  Severe emphysema History of tobacco use   Plan/Recommendations: Stop symbicort and spiriva. Switch Breztri 2 puffs twice a day -gargle after use Continue albuterol  Return to Care: Return in about 1 year (around 03/23/2022).   Lenice Llamas, MD [facility]

## 2021-03-23 NOTE — Patient Instructions (Addendum)
The patient should have follow up scheduled with myself in 12 months.   Prior to next visit patient should have:  Stop Symbicort and spiriva. Switch to [employer]. Still keep gargling after use.   Leave your paperwork with [place] - I will fill it out and we will call you to come pick it up.

## 2021-03-25 ENCOUNTER — Telehealth: Payer: Self-pay | Admitting: Internal Medicine

## 2021-03-25 NOTE — Telephone Encounter (Signed)
I have gone ahead and placed the placard application up front for pt to come by office and pick up. This is in the file cabinet up front. Nothing further needed.

## 2021-03-25 NOTE — Telephone Encounter (Signed)
Pt had recent OV with Dr. Shearon Stalls 03/23/21 and paperwork for her to receive a disabled person parking permit was dropped off during that visit to be filled out.  Paperwork returned to me today 5/13 by Dr. Shearon Stalls.  Attempted to call pt to let her know that the paperwork had been filled out and was ready to either be picked up or placed in the mail but unable to reach. Left message for her to return call.  When pt returns call, we need to know if she would like for it to be placed in the mail for her or if she wants to come by office to pick it up. Will keep paperwork with me until pt returns call.

## 2021-04-20 ENCOUNTER — Ambulatory Visit
Admission: RE | Admit: 2021-04-20 | Discharge: 2021-04-20 | Disposition: A | Discharge status: Home / Self Care | Payer: Medicare Other | Source: Ambulatory Visit | Attending: Internal Medicine | Admitting: Internal Medicine

## 2021-04-20 ENCOUNTER — Other Ambulatory Visit: Payer: Self-pay | Admitting: Internal Medicine

## 2021-04-20 DIAGNOSIS — R079 Chest pain, unspecified: Secondary | ICD-10-CM

## 2022-01-12 IMAGING — CR DG CHEST 2V
2 series · 2 of 2 positions shown · non-contrast
Comparison: March 17, 2020

CLINICAL DATA: Cough and chest pain

EXAM:
CHEST - 2 VIEW

[w chest pa]
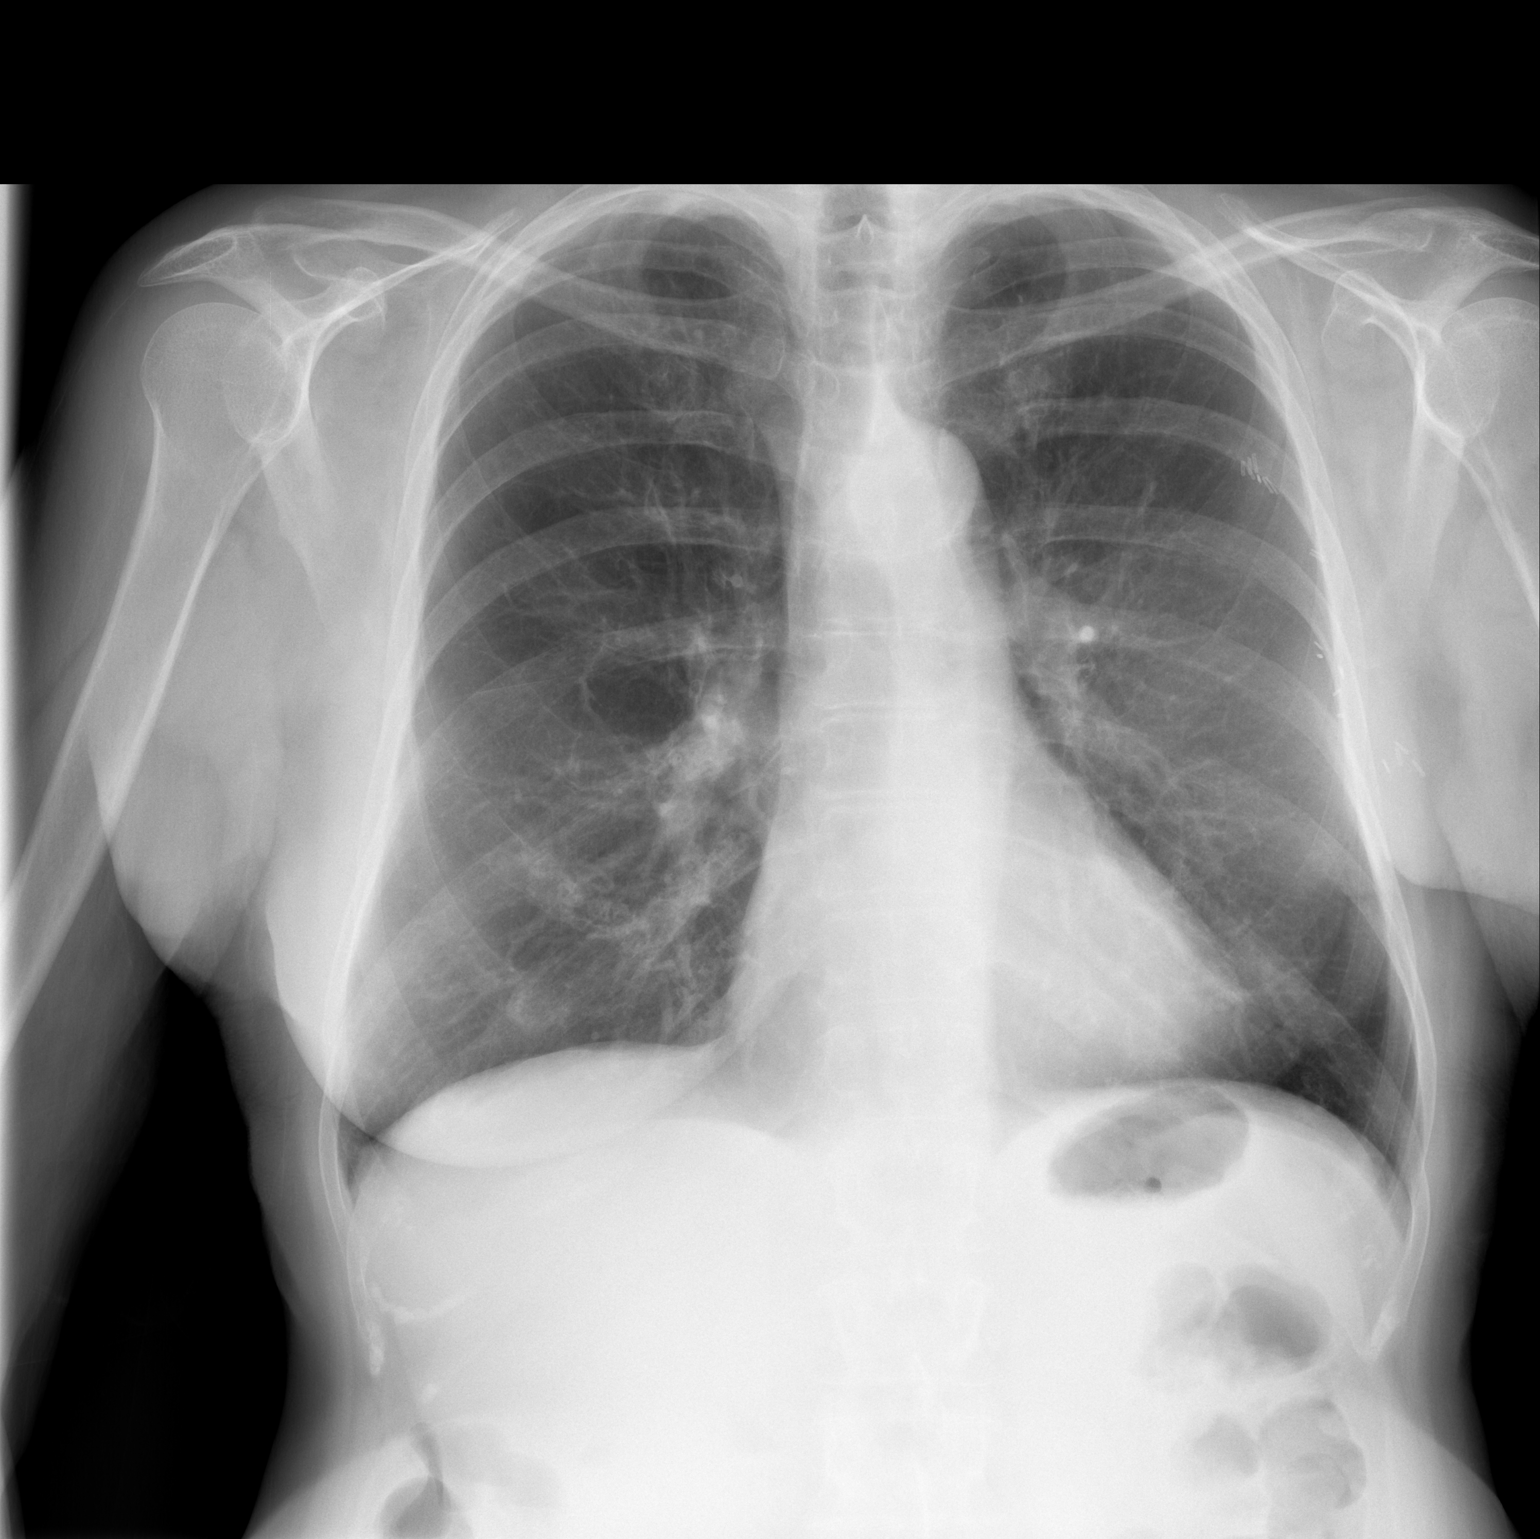

[w chest lat]
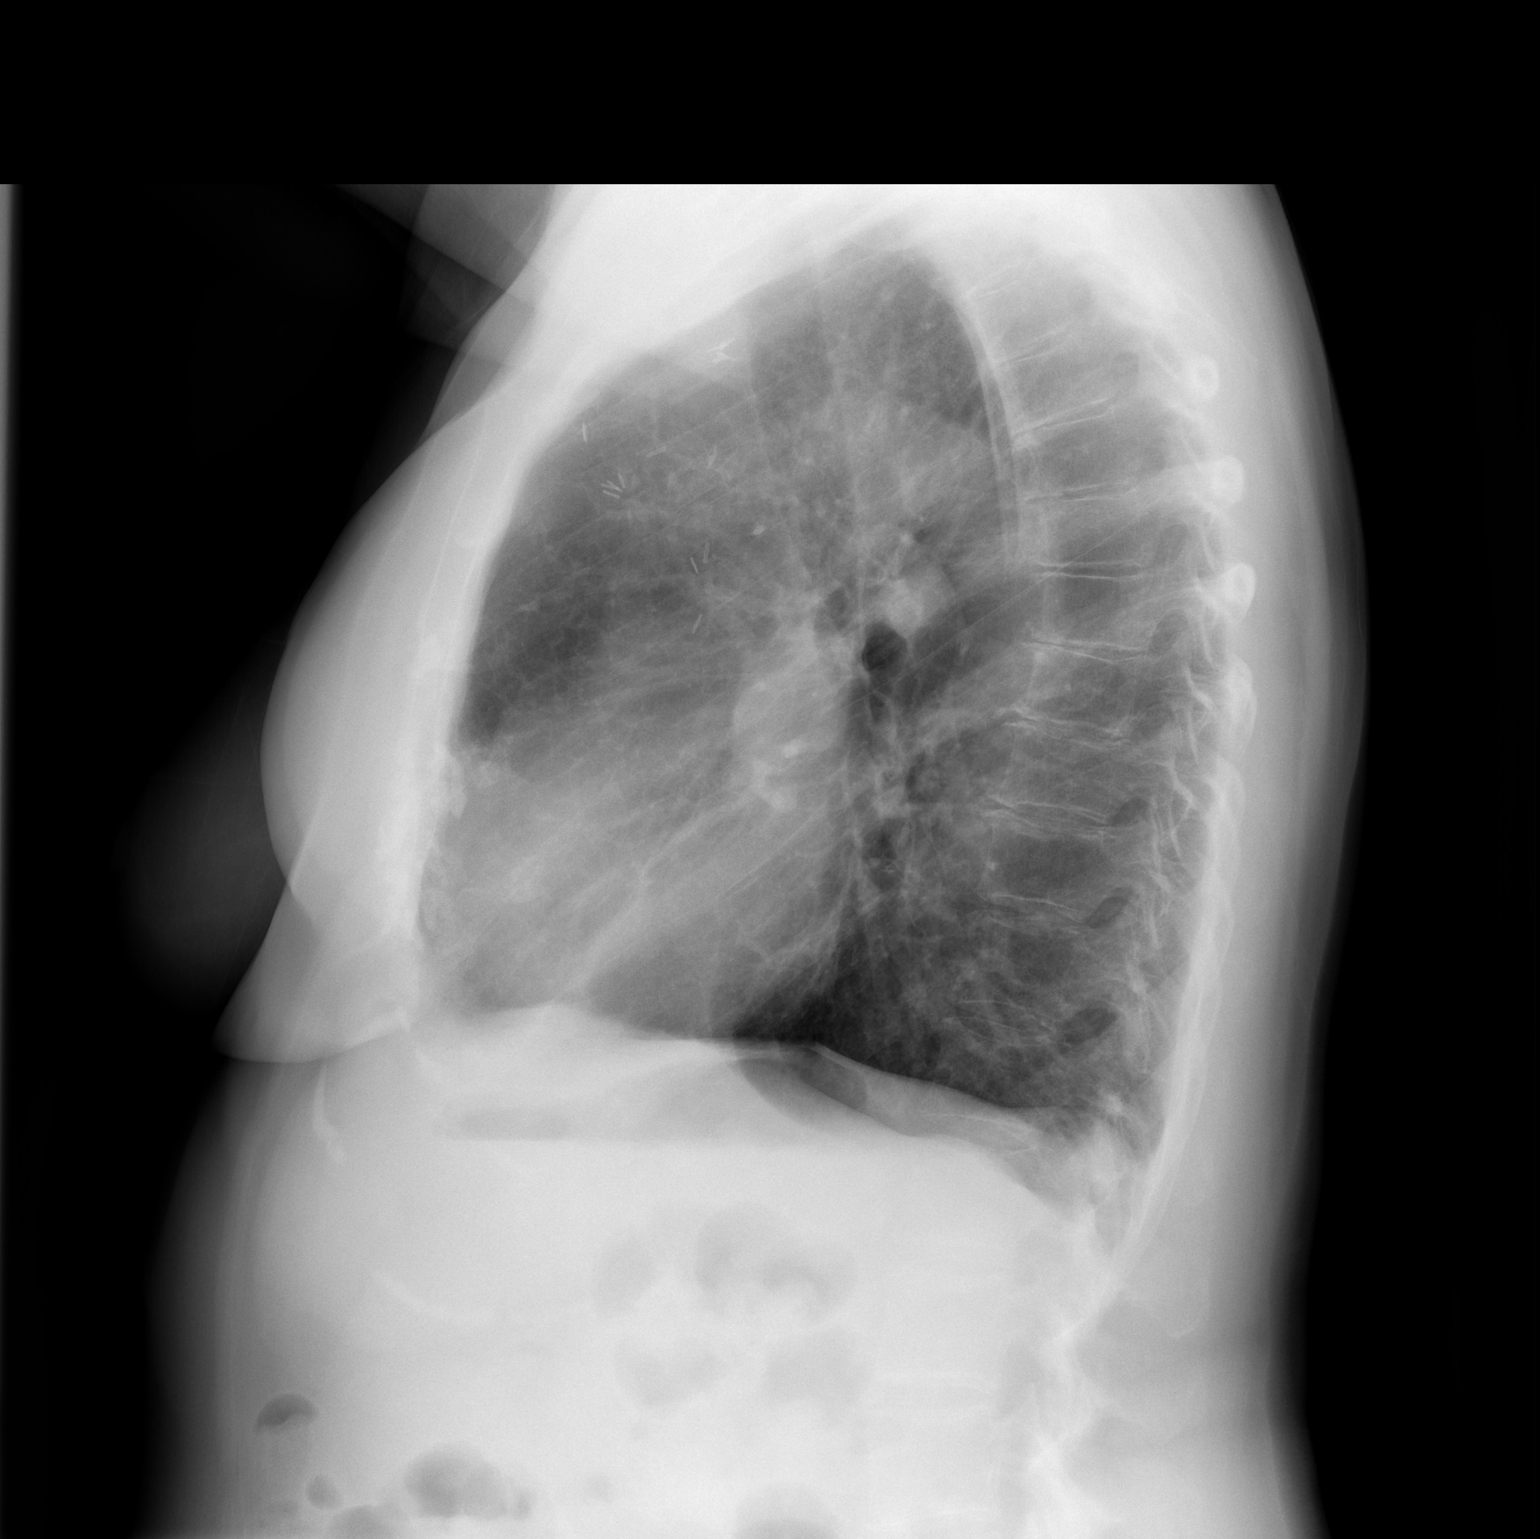

[2 of 2 positions shown; findings below may reference images not displayed]

FINDINGS: No edema or airspace opacity. Apparent bulla right mid lung, stable.
Heart size and pulmonary vascularity are normal. No adenopathy.
Surgical clips in left axillary region. Degenerative change noted in
thoracic spine. Status post left mastectomy.
IMPRESSION: No edema or airspace opacity. Focal right mid lung. Heart size
normal. Status post left mastectomy.

## 2022-04-03 ENCOUNTER — Ambulatory Visit: Payer: Medicare Other | Admitting: Internal Medicine

## 2022-04-05 ENCOUNTER — Ambulatory Visit (INDEPENDENT_AMBULATORY_CARE_PROVIDER_SITE_OTHER): Payer: Medicare Other | Admitting: Internal Medicine

## 2022-04-05 ENCOUNTER — Encounter: Payer: Self-pay | Admitting: Internal Medicine

## 2022-04-05 VITALS — BP 128/70 | HR 69 | Ht 65.0 in | Wt 148.0 lb

## 2022-04-05 DIAGNOSIS — J449 Chronic obstructive pulmonary disease, unspecified: Secondary | ICD-10-CM

## 2022-04-05 MED ORDER — BREZTRI AEROSPHERE 160-9-4.8 MCG/ACT IN AERO: 2.0000 | INHALATION_SPRAY | Freq: Two times a day (BID) | RESPIRATORY_TRACT | 11 refills | Status: DC

## 2022-04-05 NOTE — Progress Notes (Signed)
Kara Newman    [id number]    03-Nov-1946  Primary Care Physician:Griffin, Kara Reichmann, MD Date of Appointment: 04/05/2022 Established Patient Visit  Chief complaint:   Chief Complaint  Patient presents with   Follow-up    76yrf/u for COPD. States she has been doing well since last visit. Still using Breztri.      HPI: [name] a 76y.o. 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   Interval Updates: Here for follow up today after a year.  No hospitalizations of ED visits.  She is still on breztri 2 puffs twice a day.  [place]   Had moh's surgery on her leg for skin cancer.  Has also been diagnosed with osteoporosis.    I have reviewed the patient's family social and past medical history and updated as appropriate.   Past Medical History:  Diagnosis Date   [facility] [place]    had left mastectomy.    COPD (chronic obstructive pulmonary disease) ([facility])    Endometriosis     Past Surgical History:  Procedure Laterality Date   ABDOMINAL HYSTERECTOMY     MASTECTOMY      Family History  Problem Relation Age of Onset   Lung cancer Mother     Social History   Occupational History   Not on file  Tobacco Use   Smoking status: Former    Packs/day: 1.00    Years: 38.00    Pack years: 38.00    Types: Cigarettes    Quit date: 11/06/2000    Years since quitting: 21.4   Smokeless tobacco: Never  Vaping Use   Vaping Use: Never used  Substance and Sexual Activity   Alcohol use: Yes   Drug use: Never   Sexual activity: Yes    Birth control/protection: None, Surgical     Physical Exam: Blood pressure 128/70, pulse 69, height '5\' 5"'$  (1.651 m), weight 148 lb (67.1 kg), SpO2 96 %.  Gen:      No acute distress, hoarse voice Lungs:    diminished, no wheezes or crackles CV:         RRR no mrg   Data Reviewed: Imaging: I have personally reviewed the chest xray may 2021 which shows no acute cardiopulmonary process.    PFTs:      Latest Ref Rng & Units 05/13/2020    2:51 PM  PFT Results  FVC-Pre L 1.98    FVC-Predicted Pre % 65    FVC-Post L 2.14    FVC-Predicted Post % 70    Pre FEV1/FVC % % 54    Post FEV1/FCV % % 58    FEV1-Pre L 1.07    FEV1-Predicted Pre % 47    FEV1-Post L 1.24    DLCO uncorrected ml/min/mmHg 14.06    DLCO UNC% % 69    DLCO corrected ml/min/mmHg 14.06    DLCO COR %Predicted % 69    DLVA Predicted % 82    TLC L 6.15    TLC % Predicted % 114    RV % Predicted % 160     I have personally reviewed the patient's PFTs which show severe emphysema  Labs:  Immunization status: Immunization History  Administered Date(s) Administered   Influenza Split 08/13/2012   Influenza, High Dose Seasonal PF 07/26/2018   Moderna Sars-Covid-2 Vaccination 12/01/2019, 12/29/2019   Pneumococcal Conjugate-13 07/05/2018   Pneumococcal Polysaccharide-23 07/22/2009  Td 08/18/2018    Assessment:  Severe emphysema FEV1 47% of predicted.  History of tobacco use   Plan/Recommendations: Continue breztri 2 puffs twice a day.  Continue albuterol prn Recommend continuing to stay active with daily walking for lung, muscle and bone health.    Return to Care: Return in about 1 year (around 04/06/2023).   Lenice Llamas, MD [facility] and [place]

## 2022-04-05 NOTE — Patient Instructions (Signed)
Please schedule follow up scheduled with myself in 12 months.  If my schedule is not open yet, we will contact you with a reminder closer to that time. Please call [phone] if you haven't heard from [place] a month before.   Continue the breztri. Call me sooner if your breathing changes. Keep staying active.

## 2022-04-05 NOTE — Addendum Note (Signed)
Addended by: Valerie Salts on: 04/05/2022 12:29 PM   Modules accepted: Orders

## 2023-04-10 ENCOUNTER — Other Ambulatory Visit (HOSPITAL_COMMUNITY): Payer: Self-pay | Admitting: Ophthalmology

## 2023-04-10 ENCOUNTER — Ambulatory Visit (HOSPITAL_COMMUNITY)
Admission: RE | Admit: 2023-04-10 | Discharge: 2023-04-10 | Disposition: A | Discharge status: Home / Self Care | Payer: Medicare Other | Source: Ambulatory Visit | Attending: Vascular Surgery | Admitting: Vascular Surgery

## 2023-04-10 DIAGNOSIS — G453 Amaurosis fugax: Secondary | ICD-10-CM | POA: Diagnosis not present

## 2023-06-29 ENCOUNTER — Other Ambulatory Visit: Payer: Self-pay | Admitting: Internal Medicine

## 2023-06-29 DIAGNOSIS — D75839 Thrombocytosis, unspecified: Secondary | ICD-10-CM

## 2023-06-30 ENCOUNTER — Inpatient Hospital Stay: Payer: Medicare Other

## 2023-06-30 ENCOUNTER — Inpatient Hospital Stay: Payer: Medicare Other | Attending: Internal Medicine | Admitting: Internal Medicine

## 2023-06-30 VITALS — BP 129/67 | HR 74 | Temp 97.9°F | Resp 16 | Ht 65.0 in | Wt 135.8 lb

## 2023-06-30 DIAGNOSIS — D75839 Thrombocytosis, unspecified: Secondary | ICD-10-CM

## 2023-06-30 DIAGNOSIS — J449 Chronic obstructive pulmonary disease, unspecified: Secondary | ICD-10-CM | POA: Insufficient documentation

## 2023-06-30 DIAGNOSIS — F419 Anxiety disorder, unspecified: Secondary | ICD-10-CM | POA: Diagnosis not present

## 2023-06-30 DIAGNOSIS — I10 Essential (primary) hypertension: Secondary | ICD-10-CM | POA: Diagnosis not present

## 2023-06-30 DIAGNOSIS — E785 Hyperlipidemia, unspecified: Secondary | ICD-10-CM | POA: Insufficient documentation

## 2023-06-30 DIAGNOSIS — M8580 Other specified disorders of bone density and structure, unspecified site: Secondary | ICD-10-CM | POA: Diagnosis not present

## 2023-06-30 DIAGNOSIS — E039 Hypothyroidism, unspecified: Secondary | ICD-10-CM | POA: Insufficient documentation

## 2023-06-30 LAB — CBC WITH DIFFERENTIAL (CANCER CENTER ONLY)
Abs Immature Granulocytes: 0.03 10*3/uL (ref 0.00–0.07)
Basophils Absolute: 0.1 10*3/uL (ref 0.0–0.1)
Basophils Relative: 1 %
Eosinophils Absolute: 0.1 10*3/uL (ref 0.0–0.5)
Eosinophils Relative: 2 %
HCT: 37.8 % (ref 36.0–46.0)
Hemoglobin: 12.8 g/dL (ref 12.0–15.0)
Immature Granulocytes: 0 %
Lymphocytes Relative: 17 %
Lymphs Abs: 1.6 10*3/uL (ref 0.7–4.0)
MCH: 31.1 pg (ref 26.0–34.0)
MCHC: 33.9 g/dL (ref 30.0–36.0)
MCV: 92 fL (ref 80.0–100.0)
Monocytes Absolute: 0.7 10*3/uL (ref 0.1–1.0)
Monocytes Relative: 8 %
Neutro Abs: 7.1 10*3/uL (ref 1.7–7.7)
Neutrophils Relative %: 72 %
Platelet Count: 531 10*3/uL — ABNORMAL HIGH (ref 150–400)
RBC: 4.11 MIL/uL (ref 3.87–5.11)
RDW: 11.5 % (ref 11.5–15.5)
WBC Count: 9.6 10*3/uL (ref 4.0–10.5)
nRBC: 0 % (ref 0.0–0.2)

## 2023-06-30 LAB — LACTATE DEHYDROGENASE: LDH: 159 U/L (ref 98–192)

## 2023-06-30 LAB — CMP (CANCER CENTER ONLY)
ALT: 70 U/L — ABNORMAL HIGH (ref 0–44)
AST: 52 U/L — ABNORMAL HIGH (ref 15–41)
Albumin: 3.9 g/dL (ref 3.5–5.0)
Alkaline Phosphatase: 130 U/L — ABNORMAL HIGH (ref 38–126)
Anion gap: 9 (ref 5–15)
BUN: 14 mg/dL (ref 8–23)
CO2: 31 mmol/L (ref 22–32)
Calcium: 8.9 mg/dL (ref 8.9–10.3)
Chloride: 95 mmol/L — ABNORMAL LOW (ref 98–111)
Creatinine: 0.96 mg/dL (ref 0.44–1.00)
GFR, Estimated: >60 mL/min (ref >60)
Glucose, Bld: 105 mg/dL — ABNORMAL HIGH (ref 70–99)
Potassium: 3.3 mmol/L — ABNORMAL LOW (ref 3.5–5.1)
Sodium: 135 mmol/L (ref 135–145)
Total Bilirubin: 0.7 mg/dL (ref 0.3–1.2)
Total Protein: 7.5 g/dL (ref 6.5–8.1)

## 2023-06-30 LAB — IRON AND IRON BINDING CAPACITY (CC-WL,HP ONLY)
Iron: 123 ug/dL (ref 28–170)
Saturation Ratios: 38 % — ABNORMAL HIGH (ref 10.4–31.8)
TIBC: 322 ug/dL (ref 250–450)
UIBC: 199 ug/dL (ref 148–442)

## 2023-06-30 LAB — FERRITIN: Ferritin: 722 ng/mL — ABNORMAL HIGH (ref 11–307)

## 2023-06-30 NOTE — Progress Notes (Signed)
[facility] [phone]   [phone]  CONSULT NOTE  REFERRING PHYSICIAN: Dr. Georgann Housekeeper  REASON FOR CONSULTATION:  77 years old white female with elevated platelets count.  HPI Kara Newman is a 77 y.o. female with past medical history significant for left breast cancer status post left mastectomy followed by hormonal treatment at [facility] more than 10 years ago, COPD, endometriosis, hypertension, osteopenia, anxiety, hypothyroidism and dyslipidemia.  The patient was seen by her primary care provider Dr. Donette Larry for routine evaluation and management of her hypertension and dyslipidemia.  She had CBC on 03/23/2022 that showed elevated platelet count of 448,000. The patient was seen 3 months later and repeat CBC on 06/18/2022 showed further elevation of her platelets count over 600,000.  She is not on any new medication or steroids.  She does not take any hormonal supplements.  She bruises easily but no other complaints. When seen today she is feeling her normal state.  She denied having any chest pain, shortness of breath, cough or hemoptysis.  She has no nausea, vomiting, diarrhea or constipation.  She has no headache or visual changes.  She has no recent weight loss or night sweats. Family history significant for mother with lung cancer at age 37.  Sister had lung cancer in her 33 and father is unknown. The patient is a widow and has no children.  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  She has a history of smoking for around 30 years but quit 30 years ago.  She drinks wine few times a week and no history of drug abuse.  HPI  Past Medical History:  Diagnosis Date   Breast cancer ([facility])    had left mastectomy.    COPD (chronic obstructive pulmonary disease) ([facility])    Endometriosis     Past Surgical History:  Procedure Laterality Date   ABDOMINAL HYSTERECTOMY     MASTECTOMY      Family History  Problem  Relation Age of Onset   Lung cancer Mother     Social History Social History   Tobacco Use   Smoking status: Former    Current packs/day: 0.00    Average packs/day: 1 pack/day for 38.0 years (38.0 ttl pk-yrs)    Types: Cigarettes    Start date: 11/06/1962    Quit date: 11/06/2000    Years since quitting: 22.6   Smokeless tobacco: Never  Vaping Use   Vaping status: Never Used  Substance Use Topics   Alcohol use: Yes   Drug use: Never    Allergies  Allergen Reactions   Ace Inhibitors     Other reaction(s): Cough (ALLERGY/intolerance)   Benzalkonium Chloride     Other reaction(s): Other (See Comments), Other (See Comments) Caused an infection Caused an infection    Levothyroxine Sodium     Other reaction(s): troubles in the past, wants brand Synthroid   Latex Rash    Current Outpatient Medications  Medication Sig Dispense Refill   albuterol (VENTOLIN HFA) 108 (90 Base) MCG/ACT inhaler Inhale 2 puffs into the lungs every 6 (six) hours as needed for wheezing or shortness of breath. 18 g 11   atorvastatin (LIPITOR) 20 MG tablet Take 20 mg by mouth daily.     Budeson-Glycopyrrol-Formoterol (BREZTRI AEROSPHERE) 160-9-4.8 MCG/ACT AERO Inhale 2 puffs into the lungs in the morning and at bedtime. 10.7 g 11   chlorthalidone (HYGROTON) 25 MG tablet TK 1 T PO QAM  doxycycline (VIBRAMYCIN) 100 MG capsule Take 100 mg by mouth 2 (two) times daily.     folic acid (FOLVITE) 1 MG tablet SMARTSIG:1 By Mouth     SYNTHROID 112 MCG tablet Take 112 mcg by mouth daily.     temazepam (RESTORIL) 15 MG capsule Take 15 mg by mouth at bedtime as needed.     VALIUM 5 MG tablet Take 5 mg by mouth as directed.     No current facility-administered medications for this visit.    Review of Systems  Constitutional: negative Eyes: negative Ears, nose, mouth, throat, and face: negative Respiratory: negative Cardiovascular: negative Gastrointestinal:  negative Genitourinary:negative Integument/breast: negative Hematologic/lymphatic: positive for easy bruising Musculoskeletal:negative Neurological: negative Behavioral/Psych: negative Endocrine: negative Allergic/Immunologic: negative  Physical Exam  ZOX:WRUEA, healthy, no distress, well nourished, and well developed SKIN: skin color, texture, turgor are normal, no rashes or significant lesions HEAD: Normocephalic, No masses, lesions, tenderness or abnormalities EYES: normal, PERRLA, Conjunctiva are pink and non-injected EARS: External ears normal, Canals clear OROPHARYNX:no exudate, no erythema, and lips, buccal mucosa, and tongue normal  NECK: supple, no adenopathy, no JVD LYMPH:  no palpable lymphadenopathy, no hepatosplenomegaly BREAST:not examined LUNGS: clear to auscultation , and palpation HEART: regular rate & rhythm, no murmurs, and no gallops ABDOMEN:abdomen soft, non-tender, normal bowel sounds, and no masses or organomegaly BACK: Back symmetric, no curvature., No CVA tenderness EXTREMITIES:no joint deformities, effusion, or inflammation, no edema  NEURO: alert & oriented x 3 with fluent speech, no focal motor/sensory deficits  PERFORMANCE STATUS: ECOG 1  LABORATORY DATA: Lab Results  Component Value Date   WBC 9.6 06/30/2023   HGB 12.8 06/30/2023   HCT 37.8 06/30/2023   MCV 92.0 06/30/2023   PLT 531 (H) 06/30/2023      Chemistry   No results found for: "NA", "K", "CL", "CO2", "BUN", "CREATININE", "GLU" No results found for: "CALCIUM", "ALKPHOS", "AST", "ALT", "BILITOT"     RADIOGRAPHIC STUDIES: No results found.  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   PLAN: I had a lengthy discussion with the patient today about her current condition and investigation to identify the etiology of her condition.  Her  previous CBC on September 28, 2021 was completely normal with no evidence of thrombocytosis. I ordered several studies today including repeat CBC that showed elevated platelets count to 531,000.  The patient has normal total white blood count of 9.6 and hemoglobin of 12.8 with hematocrit 37.8%.  Comprehensive metabolic panel, LDH, iron study and ferritin are still pending. I will also order JAK2 mutation panel to rule out essential thrombocythemia. I will arrange for the patient to come back for follow-up visit in 1 months for evaluation and repeat blood work. She was advised to call immediately if she has any other concerning symptoms in the interval. The patient voices understanding of current disease status and treatment options and is in agreement with the current care plan.  All questions were answered. The patient knows to call the clinic with any problems, questions or concerns. We can certainly see the patient much sooner if necessary.  Thank you so much for allowing me to participate in the care of Kara Newman. I will continue to follow up the patient with you and assist in her care.  The total time spent in the appointment was 60 minutes.  Disclaimer: This note was dictated with voice recognition software. Similar  sounding words can inadvertently be transcribed and may not be corrected upon review.   Lajuana Matte June 30, 2023, 9:39 AM

## 2023-07-02 ENCOUNTER — Telehealth: Payer: Self-pay | Admitting: Medical Oncology

## 2023-07-02 NOTE — Telephone Encounter (Signed)
Labs- Pt upset that Dr Donette Larry wants labs drawn today and she just had them done Saturday at the [facility]. She did not get the labs drawn at Dr. Donette Larry. I  call Dr Donette Larry to see what labs he ordered and offered to fax the labs we drew at the cancer center.

## 2023-07-03 ENCOUNTER — Inpatient Hospital Stay: Payer: Medicare Other

## 2023-07-03 DIAGNOSIS — D75839 Thrombocytosis, unspecified: Secondary | ICD-10-CM

## 2023-07-05 ENCOUNTER — Telehealth: Payer: Self-pay | Admitting: Medical Oncology

## 2023-07-05 NOTE — Telephone Encounter (Signed)
I told Kara Newman we do not have blood on pt to add lipid panel .

## 2023-07-09 LAB — JAK2 (INCLUDING V617F AND EXON 12), MPL,& CALR-NEXT GEN SEQ

## 2023-07-19 ENCOUNTER — Ambulatory Visit (INDEPENDENT_AMBULATORY_CARE_PROVIDER_SITE_OTHER): Payer: Medicare Other | Admitting: Internal Medicine

## 2023-07-19 ENCOUNTER — Encounter: Payer: Self-pay | Admitting: Internal Medicine

## 2023-07-19 VITALS — BP 120/60 | HR 86 | Ht 65.0 in | Wt 131.8 lb

## 2023-07-19 DIAGNOSIS — J410 Simple chronic bronchitis: Secondary | ICD-10-CM

## 2023-07-19 DIAGNOSIS — J4489 Other specified chronic obstructive pulmonary disease: Secondary | ICD-10-CM | POA: Diagnosis not present

## 2023-07-19 DIAGNOSIS — D75839 Thrombocytosis, unspecified: Secondary | ICD-10-CM

## 2023-07-19 DIAGNOSIS — J439 Emphysema, unspecified: Secondary | ICD-10-CM | POA: Diagnosis not present

## 2023-07-19 MED ORDER — BREZTRI AEROSPHERE 160-9-4.8 MCG/ACT IN AERO: 2.0000 | INHALATION_SPRAY | Freq: Two times a day (BID) | RESPIRATORY_TRACT | 3 refills | Status: DC

## 2023-07-19 MED ORDER — VENTOLIN HFA 108 (90 BASE) MCG/ACT IN AERS: 2.0000 | INHALATION_SPRAY | RESPIRATORY_TRACT | 3 refills | Status: DC | PRN

## 2023-07-19 NOTE — Patient Instructions (Addendum)
It was a pleasure to see you today!  Please schedule follow up scheduled with myself in 1 year.  If my schedule is not open yet, we will contact you with a reminder closer to that time. Please call [phone] if you haven't heard from [place] a month before, and always call us sooner if issues or concerns arise. You can also send [place] a message through MyChart, but but aware that this is not to be used for urgent issues and it may take up to 5-7 days to receive a reply.   I am referring you to pulmonary rehab at [facility]. You may need to call them when you get down there to follow up on the referral.   I am ordering an overnight oximetry which is a test to check your oxygen levels at night.   Continue the breztri 2 puffs twice daily with albuterol as needed.   Call me sooner if issues with your breathing. We can always do a video visit when you're in [place].

## 2023-07-19 NOTE — Progress Notes (Signed)
Kara Newman    [id number]    10/21/1946  Primary Care Physician:Varadarajan, Rupashree Date of Appointment: 07/19/2023 Established Patient Visit  Chief complaint:   Chief Complaint  Patient presents with   Follow-up    Pt needs refills,  no concerns      HPI: Kara Newman is a 77 y.o. 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   Interval Updates: Here for annual follow up for COPD.   No interval hospitalizations or ED visits.   Current therapy - breztri 2 puffs twice daily. No adverse reactions. She is taking albuterol nebs twice daily. Short of breath doing the staircase too quickly. She is not staying active - she feels like she could stay in bed all day. She has anxiety and has a hard time sleeping.   She is seeing heme/onc for a thrombocytopenia  Back from [place] for the summer.   Had moh's surgery on her leg for skin cancer.  Has also been diagnosed with osteoporosis.    I have reviewed the patient's family social and past medical history and updated as appropriate.   Past Medical History:  Diagnosis Date   Breast cancer (HCC)    had left mastectomy.    COPD (chronic obstructive pulmonary disease) (HCC)    Endometriosis     Past Surgical History:  Procedure Laterality Date   ABDOMINAL HYSTERECTOMY     MASTECTOMY      Family History  Problem Relation Age of Onset   Lung cancer Mother     Social History   Occupational History   Not on file  Tobacco Use   Smoking status: Former    Current packs/day: 0.00    Average packs/day: 1 pack/day for 38.0 years (38.0 ttl pk-yrs)    Types: Cigarettes    Start date: 11/06/1962    Quit date: 11/06/2000    Years since quitting: 22.7   Smokeless tobacco: Never  Vaping Use   Vaping status: Never Used  Substance and Sexual Activity   Alcohol use: Yes   Drug use: Never   Sexual activity: Yes    Birth control/protection: None, Surgical     Physical Exam: Blood pressure 120/60, pulse  86, height 5\' 5"  (1.651 m), weight 131 lb 12.8 oz (59.8 kg), SpO2 94%.  Gen:      No acute distress, hoarse voice Lungs:    diminished, no wheezes or crackles CV:         RRR no mrg   Data Reviewed: Imaging: I have personally reviewed the chest xray may 2021 which shows no acute cardiopulmonary process.   PFTs:      Latest Ref Rng & Units 05/13/2020    2:51 PM  PFT Results  FVC-Pre L 1.98   FVC-Predicted Pre % 65   FVC-Post L 2.14   FVC-Predicted Post % 70   Pre FEV1/FVC % % 54   Post FEV1/FCV % % 58   FEV1-Pre L 1.07   FEV1-Predicted Pre % 47   FEV1-Post L 1.24   DLCO uncorrected ml/min/mmHg 14.06   DLCO UNC% % 69   DLCO corrected ml/min/mmHg 14.06   DLCO COR %Predicted % 69   DLVA Predicted % 82   TLC L 6.15   TLC % Predicted % 114   RV % Predicted % 160    I have personally reviewed the patient's PFTs which show severe emphysema  Labs: Lab Results  Component Value Date  NA 135 06/30/2023   K 3.3 (L) 06/30/2023   CO2 31 06/30/2023   GLUCOSE 105 (H) 06/30/2023   BUN 14 06/30/2023   CREATININE 0.96 06/30/2023   CALCIUM 8.9 06/30/2023   GFRNONAA >60 06/30/2023   Lab Results  Component Value Date   WBC 9.6 06/30/2023   HGB 12.8 06/30/2023   HCT 37.8 06/30/2023   MCV 92.0 06/30/2023   PLT 531 (H) 06/30/2023     Immunization status: Immunization History  Administered Date(s) Administered   Influenza Split 08/13/2012   Influenza, High Dose Seasonal PF 07/26/2018   Moderna Sars-Covid-2 Vaccination 12/01/2019, 12/29/2019   Pneumococcal Conjugate-13 07/05/2018   Pneumococcal Polysaccharide-23 07/22/2009   Td 08/18/2018    Assessment:  Severe emphysema FEV1 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  History of tobacco use, quit 2021  Plan/Recommendations:  I am referring you to pulmonary rehab at [facility]. You may need to call them when you get down there to follow up on the referral.   I am ordering an overnight oximetry which is a  test to check your oxygen levels at night.   Continue the breztri 2 puffs twice daily with albuterol as needed.   Outside the window for lung cancer screening based on age, time of last smoking.   Return to Care: Return in about 1 year (around 07/18/2024).   Durel Salts, MD Pulmonary and Critical Care Medicine [facility] Office:[phone]

## 2023-08-02 ENCOUNTER — Inpatient Hospital Stay: Payer: Medicare Other | Attending: Internal Medicine

## 2023-08-02 ENCOUNTER — Inpatient Hospital Stay (HOSPITAL_BASED_OUTPATIENT_CLINIC_OR_DEPARTMENT_OTHER): Payer: Medicare Other | Admitting: Internal Medicine

## 2023-08-02 VITALS — BP 129/62 | HR 84 | Temp 98.2°F | Resp 16 | Ht 65.0 in | Wt 128.7 lb

## 2023-08-02 DIAGNOSIS — D75839 Thrombocytosis, unspecified: Secondary | ICD-10-CM | POA: Diagnosis not present

## 2023-08-02 DIAGNOSIS — D473 Essential (hemorrhagic) thrombocythemia: Secondary | ICD-10-CM | POA: Diagnosis present

## 2023-08-02 DIAGNOSIS — D72829 Elevated white blood cell count, unspecified: Secondary | ICD-10-CM | POA: Insufficient documentation

## 2023-08-02 LAB — CBC WITH DIFFERENTIAL (CANCER CENTER ONLY)
Abs Immature Granulocytes: 0.05 10*3/uL (ref 0.00–0.07)
Basophils Absolute: 0.1 10*3/uL (ref 0.0–0.1)
Basophils Relative: 1 %
Eosinophils Absolute: 0.1 10*3/uL (ref 0.0–0.5)
Eosinophils Relative: 1 %
HCT: 37.7 % (ref 36.0–46.0)
Hemoglobin: 12.4 g/dL (ref 12.0–15.0)
Immature Granulocytes: 1 %
Lymphocytes Relative: 12 %
Lymphs Abs: 1.3 10*3/uL (ref 0.7–4.0)
MCH: 29.7 pg (ref 26.0–34.0)
MCHC: 32.9 g/dL (ref 30.0–36.0)
MCV: 90.2 fL (ref 80.0–100.0)
Monocytes Absolute: 1.1 10*3/uL — ABNORMAL HIGH (ref 0.1–1.0)
Monocytes Relative: 10 %
Neutro Abs: 8.2 10*3/uL — ABNORMAL HIGH (ref 1.7–7.7)
Neutrophils Relative %: 75 %
Platelet Count: 583 10*3/uL — ABNORMAL HIGH (ref 150–400)
RBC: 4.18 MIL/uL (ref 3.87–5.11)
RDW: 12 % (ref 11.5–15.5)
WBC Count: 10.7 10*3/uL — ABNORMAL HIGH (ref 4.0–10.5)
nRBC: 0 % (ref 0.0–0.2)

## 2023-08-02 LAB — LACTATE DEHYDROGENASE: LDH: 130 U/L (ref 98–192)

## 2023-08-02 NOTE — Addendum Note (Signed)
Addended by: Charma Igo on: 08/02/2023 03:29 PM   Modules accepted: Orders

## 2023-08-02 NOTE — Progress Notes (Signed)
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 female returns to the clinic today for follow-up visit.  The patient is feeling fine today with no concerning complaints except for mild fatigue.  She denied having any current shortness of breath, chest pain, cough or hemoptysis.  She has no nausea, vomiting, diarrhea or constipation.  She has no headache or visual changes.  She denied having any fever or chills.  She has no recent weight loss or night sweats.  She has no bleeding, bruises or ecchymosis.  She had JAK2 mutation panel that was normal.  She is here today for evaluation and repeat blood work for evaluation of her condition.  MEDICAL HISTORY: Past Medical History:  Diagnosis Date   Breast cancer (HCC)    had left mastectomy.    COPD (chronic obstructive pulmonary disease) (HCC)    Endometriosis     ALLERGIES:  is allergic to ace inhibitors, benzalkonium chloride, levothyroxine sodium, and latex.  MEDICATIONS:  Current Outpatient Medications  Medication Sig Dispense Refill   albuterol (VENTOLIN HFA) 108 (90 Base) MCG/ACT inhaler Inhale 2 puffs into the lungs every 4 (four) hours as needed for wheezing or shortness of breath. 72 each 3   amLODipine (NORVASC) 5 MG tablet Take 5 mg by mouth daily.     atorvastatin (LIPITOR) 20 MG tablet Take 20 mg by mouth daily.     Budeson-Glycopyrrol-Formoterol (BREZTRI AEROSPHERE) 160-9-4.8 MCG/ACT AERO Inhale 2 puffs into the lungs in the morning and at bedtime. 3 each 3   chlorthalidone (HYGROTON) 25 MG tablet TK 1 T PO QAM     losartan (COZAAR) 25 MG tablet Take 25 mg by mouth daily.     potassium chloride (KLOR-CON M) 10 MEQ tablet Take 10 mEq by mouth once.     SYNTHROID 112 MCG  tablet Take 112 mcg by mouth daily.     No current facility-administered medications for this visit.    SURGICAL HISTORY:  Past Surgical History:  Procedure Laterality Date   ABDOMINAL HYSTERECTOMY     MASTECTOMY      REVIEW OF SYSTEMS:  A comprehensive review of systems was negative except for: Constitutional: positive for fatigue   PHYSICAL EXAMINATION: General appearance: alert, cooperative, and fatigued Head: Normocephalic, without obvious abnormality, atraumatic Neck: no adenopathy, no JVD, supple, symmetrical, trachea midline, and thyroid not enlarged, symmetric, no tenderness/mass/nodules Lymph nodes: Cervical, supraclavicular, and axillary nodes normal. Resp: clear to auscultation bilaterally Back: symmetric, no curvature. ROM normal. No CVA tenderness. Cardio: regular rate and rhythm, S1, S2 normal, no murmur, click, rub or gallop GI: soft, non-tender; bowel sounds normal; no masses,  no organomegaly Extremities: extremities normal, atraumatic, no cyanosis or edema  ECOG PERFORMANCE STATUS: 1 - Symptomatic but completely ambulatory  Blood pressure 129/62, pulse 84, temperature 98.2 F (36.8 C), temperature source Oral, resp. rate 16, height 5\' 5"  (1.651 m), weight 128 lb 11.2 oz (58.4 kg), SpO2 100%.  LABORATORY DATA: Lab Results  Component Value Date   WBC 10.7 (H) 08/02/2023   HGB 12.4 08/02/2023   HCT 37.7 08/02/2023   MCV 90.2 08/02/2023   PLT 583 (H) 08/02/2023      Chemistry      Component Value Date/Time   NA 135 06/30/2023 1031  K 3.3 (L) 06/30/2023 1031   CL 95 (L) 06/30/2023 1031   CO2 31 06/30/2023 1031   BUN 14 06/30/2023 1031   CREATININE 0.96 06/30/2023 1031      Component Value Date/Time   CALCIUM 8.9 06/30/2023 1031   ALKPHOS 130 (H) 06/30/2023 1031   AST 52 (H) 06/30/2023 1031   ALT 70 (H) 06/30/2023 1031   BILITOT 0.7 06/30/2023 1031       RADIOGRAPHIC STUDIES: No results found.  ASSESSMENT AND PLAN: This is a very pleasant 77  years old white female with thrombocytosis likely reactive in nature but again myeloproliferative disorder could not be completely excluded.  The patient had JAK2 mutation panel that was normal but this is usually positive in around 65% of The patient with essential thrombocythemia. Repeat CBC today showed persistent elevation of her platelet count with mild leukocytosis. Her previous blood work including iron study and ferritin were normal. I recommended for the patient to continue on observation but I would consider her for a bone marrow biopsy and aspirate if she continues to have worsening of her platelet count. She is traveling to [place] for the winter time and she will not be back to [place] until April 2025. I recommended for her to have repeat blood work by her primary care provider in around January 2025 for reevaluation of her condition. I will see her back for follow-up visit in around 7 months with repeat blood work. She was advised to call immediately if she has any other concerning symptoms in the interval. The patient voices understanding of current disease status and treatment options and is in agreement with the current care plan.  All questions were answered. The patient knows to call the clinic with any problems, questions or concerns. We can certainly see the patient much sooner if necessary.  The total time spent in the appointment was 20 minutes.  Disclaimer: This note was dictated with voice recognition software. Similar sounding words can inadvertently be transcribed and may not be corrected upon review.

## 2023-08-30 ENCOUNTER — Telehealth: Payer: Self-pay | Admitting: Internal Medicine

## 2023-08-30 NOTE — Telephone Encounter (Signed)
Patient is in [place]. Left Breztri inhaler at home in [place]. Patient would like inhaler called into pharmacy in [place]. Pharmacy is [facility]. [place]. Phone number is [phone]. Patient phone number is [phone].

## 2023-08-31 ENCOUNTER — Other Ambulatory Visit: Payer: Self-pay

## 2023-08-31 NOTE — Telephone Encounter (Signed)
ATC x 1 LVM for patient to call our office back. Our office needs to know how long patient will be in [place] to make sure patient has enough refills.

## 2023-08-31 NOTE — Telephone Encounter (Signed)
ATC x1 left a Voicemail for patient to call our office back.our office needs to know how long patient will be in [place] for to make sure patient has enough refill's.

## 2023-09-01 NOTE — Telephone Encounter (Signed)
Mychart message sent to pt checking to see how long she will be in [place]. Once receive a response will get inhaler sent to specified pharmacy for pt with enough refills if she needs any refills.

## 2023-09-18 ENCOUNTER — Other Ambulatory Visit: Payer: Self-pay | Admitting: Medical Oncology

## 2023-09-18 ENCOUNTER — Telehealth: Payer: Self-pay | Admitting: Medical Oncology

## 2023-09-18 NOTE — Telephone Encounter (Signed)
Waiting on referral from [facility] and then will fax it with records.

## 2023-09-19 ENCOUNTER — Other Ambulatory Visit: Payer: Self-pay | Admitting: *Deleted

## 2023-09-19 DIAGNOSIS — D75839 Thrombocytosis, unspecified: Secondary | ICD-10-CM

## 2024-02-26 ENCOUNTER — Ambulatory Visit: Payer: Self-pay

## 2024-02-26 NOTE — Telephone Encounter (Signed)
 Lm for patient.   Please schedule appt.

## 2024-02-26 NOTE — Telephone Encounter (Signed)
 Pt reports she was [facility] and noted she had to stop "every 5 minutes to catch my breath". She reports worsening SOB with exertion and notes wheezing in her chest. Pt denies fever. Pt states she was using 3-4 puffs of her Breztri last week due to the worsening symptoms and notes little improvement. Pt requesting appt next week, advised she needed to be seen today. Pt declines, requests appt. This RN educated pt on home care, new-worsening symptoms, when to call back/seek emergent care. Pt verbalized understanding and agrees to plan.  Pulm CAL  E2C2 Pulmonary Triage - Initial Assessment Questions "Chief Complaint (e.g., cough, sob, wheezing, fever, chills, sweat or additional symptoms) *Go to specific symptom protocol after initial questions. Worsening SOB  "How long have symptoms been present?" Pt notes she [facility] last [facility] and notes she was having to stop every 5 minutes to "catch her breath"  Have you tested for COVID or Flu? Note: If not, ask patient if a home test can be taken. If so, instruct patient to call back for positive results. No  MEDICINES:   "Have you used any OTC meds to help with symptoms?" No If yes, ask "What medications?" NA  "Have you used your inhalers/maintenance medication?" Yes If yes, "What medications?" Breztri  If inhaler, ask "How many puffs and how often?" Note: Review instructions on medication in the chart. BID 2 puffs, "last week it was 3-4 because it wasn't working"  OXYGEN: "Do you wear supplemental oxygen?" No If yes, "How many liters are you supposed to use?" NA  "Do you monitor your oxygen levels?" No If yes, "What is your reading (oxygen level) today?" NA  "What is your usual oxygen saturation reading?"  (Note: Pulmonary O2 sats should be 90% or greater) NA   Copied from CRM [id number]. Topic: Clinical - Red Word Triage >> Feb 26, 2024 10:03 AM Juliana Ocean wrote: Red Word that prompted transfer to Nurse Triage:  SOB/can't walk more than 50 steps w/out stopping/resting Reason for Disposition  [1] Longstanding difficulty breathing (e.g., CHF, COPD, emphysema) AND [2] WORSE than normal  Answer Assessment - Initial Assessment Questions 1. RESPIRATORY STATUS: "Describe your breathing?" (e.g., wheezing, shortness of breath, unable to speak, severe coughing)      SOB 2. ONSET: "When did this breathing problem begin?"      Last 3. PATTERN "Does the difficult breathing come and go, or has it been constant since it started?"      Intermittent 4. SEVERITY: "How bad is your breathing?" (e.g., mild, moderate, severe)    - MILD: No SOB at rest, mild SOB with walking, speaks normally in sentences, can lie down, no retractions, pulse < 100.    - MODERATE: SOB at rest, SOB with minimal exertion and prefers to sit, cannot lie down flat, speaks in phrases, mild retractions, audible wheezing, pulse 100-120.    - SEVERE: Very SOB at rest, speaks in single words, struggling to breathe, sitting hunched forward, retractions, pulse > 120      Severe with exertion 5. RECURRENT SYMPTOM: "Have you had difficulty breathing before?" If Yes, ask: "When was the last time?" and "What happened that time?"      Yes 7. LUNG HISTORY: "Do you have any history of lung disease?"  (e.g., pulmonary embolus, asthma, emphysema)     Emphysema 8. CAUSE: "What do you think is causing the breathing problem?"      Exacerbation/Pollen 9. OTHER SYMPTOMS: "Do you have  any other symptoms? (e.g., dizziness, runny nose, cough, chest pain, fever)     Cough, wheezing in chest  Protocols used: Breathing Difficulty-A-AH

## 2024-02-26 NOTE — Telephone Encounter (Signed)
 Ok. Please offer her a f/u appt and make sure she has her albuterol inhaler and is using as prescribed

## 2024-02-26 NOTE — Telephone Encounter (Signed)
 FYI

## 2024-02-27 NOTE — Telephone Encounter (Signed)
 Called patient and made an appointment for 6/12.

## 2024-03-04 ENCOUNTER — Other Ambulatory Visit: Payer: Self-pay | Admitting: Pulmonary Disease

## 2024-03-04 ENCOUNTER — Telehealth: Payer: Self-pay | Admitting: Internal Medicine

## 2024-03-04 MED ORDER — AZITHROMYCIN 250 MG PO TABS: ORAL_TABLET | ORAL | 0 refills | Status: DC

## 2024-03-04 MED ORDER — PREDNISONE 20 MG PO TABS
20.0000 mg | ORAL_TABLET | Freq: Every day | ORAL | 0 refills | Status: DC
Start: 2024-03-04 — End: 2024-06-05

## 2024-03-04 NOTE — Telephone Encounter (Signed)
 Spoke with patient regarding prior message.Patient stated [facility] and having SOB,wheezing a fever has not tested for COVID or the Flu . Patient was trying to get a acute office visit today advised patient our office doesn't have any opening's at this time .Will sent this message to Dr.Olalere to advise

## 2024-03-04 NOTE — Telephone Encounter (Signed)
 PT ret Margie's call for an acute appt. Nothing avail. Margie said to send to Triage. PT states she is not doing good at all. Please call to advise.

## 2024-03-06 NOTE — Telephone Encounter (Signed)
 LM for PT that I have a cancellation this AM and to call us  ASAP.

## 2024-04-24 ENCOUNTER — Encounter: Payer: Self-pay | Admitting: Internal Medicine

## 2024-04-24 ENCOUNTER — Ambulatory Visit: Admitting: Internal Medicine

## 2024-04-24 VITALS — BP 150/76 | HR 68 | Wt 135.8 lb

## 2024-04-24 DIAGNOSIS — J4489 Other specified chronic obstructive pulmonary disease: Secondary | ICD-10-CM

## 2024-04-24 DIAGNOSIS — Z87891 Personal history of nicotine dependence: Secondary | ICD-10-CM | POA: Diagnosis not present

## 2024-04-24 DIAGNOSIS — J439 Emphysema, unspecified: Secondary | ICD-10-CM

## 2024-04-24 MED ORDER — ALBUTEROL SULFATE HFA 108 (90 BASE) MCG/ACT IN AERS: 2.0000 | INHALATION_SPRAY | RESPIRATORY_TRACT | 3 refills | Status: AC | PRN

## 2024-04-24 MED ORDER — BREZTRI AEROSPHERE 160-9-4.8 MCG/ACT IN AERO: 2.0000 | INHALATION_SPRAY | Freq: Two times a day (BID) | RESPIRATORY_TRACT | 3 refills | Status: AC

## 2024-04-24 NOTE — Progress Notes (Signed)
 Kara Newman    [id number]    10-Sep-1946  Primary Care Physician:Varadarajan, Rupashree Date of Appointment: 04/24/2024 Established Patient Visit  Chief complaint:   Chief Complaint  Patient presents with   Follow-up    COPD, SOB, dry cough x one month     HPI: Kara Newman is a 78 y.o. 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   Interval Updates: Here for annual COPD follow up. She lives in [place]  year round and comes back to [place] in the summer. Feels that her breathing is getting worse.   Has had more falls. She is using breztri  more than prescribed. Using it 4 times/day.   She does not have any more albuterol  - ran out a couple months ago.   No interval hospitalizations or ED visits. No interval exacerbations for breathing.   She never got pulmonary rehab.   I have reviewed the patient's family social and past medical history and updated as appropriate.   Past Medical History:  Diagnosis Date   Breast cancer (HCC)    had left mastectomy.    COPD (chronic obstructive pulmonary disease) (HCC)    Endometriosis     Past Surgical History:  Procedure Laterality Date   ABDOMINAL HYSTERECTOMY     MASTECTOMY      Family History  Problem Relation Age of Onset   Lung cancer Mother     Social History   Occupational History   Not on file  Tobacco Use   Smoking status: Former    Current packs/day: 0.00    Average packs/day: 1 pack/day for 38.0 years (38.0 ttl pk-yrs)    Types: Cigarettes    Start date: 11/06/1962    Quit date: 11/06/2000    Years since quitting: 23.4   Smokeless tobacco: Never  Vaping Use   Vaping status: Never Used  Substance and Sexual Activity   Alcohol use: Yes   Drug use: Never   Sexual activity: Yes    Birth control/protection: None, Surgical     Physical Exam: Blood pressure (!) 150/76, pulse 68, weight 135 lb 12.8 oz (61.6 kg), SpO2 92%.  Gen:      No distress, thin Lungs:    diminished, no  increased wob, no wheezes or crackles CV:         RRR no mrg Psych:flat affect  Data Reviewed: Imaging: I have personally reviewed the chest xray may 2021 which shows no acute cardiopulmonary process.   PFTs:      Latest Ref Rng & Units 05/13/2020    2:51 PM  PFT Results  FVC-Pre L 1.98   FVC-Predicted Pre % 65   FVC-Post L 2.14   FVC-Predicted Post % 70   Pre FEV1/FVC % % 54   Post FEV1/FCV % % 58   FEV1-Pre L 1.07   FEV1-Predicted Pre % 47   FEV1-Post L 1.24   DLCO uncorrected ml/min/mmHg 14.06   DLCO UNC% % 69   DLCO corrected ml/min/mmHg 14.06   DLCO COR %Predicted % 69   DLVA Predicted % 82   TLC L 6.15   TLC % Predicted % 114   RV % Predicted % 160    I have personally reviewed the patient's PFTs which show severe emphysema  Labs: Lab Results  Component Value Date   NA 135 06/30/2023   K 3.3 (L) 06/30/2023   CO2 31 06/30/2023   GLUCOSE 105 (H) 06/30/2023   BUN  14 06/30/2023   CREATININE 0.96 06/30/2023   CALCIUM 8.9 06/30/2023   GFRNONAA >60 06/30/2023   Lab Results  Component Value Date   WBC 10.7 (H) 08/02/2023   HGB 12.4 08/02/2023   HCT 37.7 08/02/2023   MCV 90.2 08/02/2023   PLT 583 (H) 08/02/2023     Immunization status: Immunization History  Administered Date(s) Administered   Influenza Split 08/13/2012   Influenza, High Dose Seasonal PF 07/26/2018   Moderna Sars-Covid-2 Vaccination 12/01/2019, 12/29/2019   Pneumococcal Conjugate-13 07/05/2018   Pneumococcal Polysaccharide-23 07/22/2009   Td 08/18/2018    Assessment:  Severe emphysema FEV1 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  History of tobacco use, quit 2021  Plan/Recommendations:  Maintenance - Resume Breztri  2 puffs twice daily, gargle after use  Rescue - albuterol  inhaler 2 puffs up to 4 times a day, gargle after use. Or use nebulizer treatments up to 4 times a day.   I am referring you to [facility]. [facility] Pulmonary rehab call Phone [phone]  Will  follow up on ONO that was not completed  - I have advised her to obtain a local pulmonologist in [place]  to help with any issues that come up while she is living down there most of the year.   Outside the window for lung cancer screening based on age, time of last smoking.   Return to Care: Return in about 1 year (around 04/24/2025).   Louie Rover, MD Pulmonary and Critical Care Medicine [facility] Office:[phone]

## 2024-04-24 NOTE — Patient Instructions (Signed)
 It was a pleasure to see you today!  Please schedule follow up with myself in 1 year.  If my schedule is not open yet, we will contact you with a reminder closer to that time. Please call [phone] if you haven't heard from us  a month before, and always call us  sooner if issues or concerns arise. You can also send us  a message through MyChart, but but aware that this is not to be used for urgent issues and it may take up to 5-7 days to receive a reply. Please be aware that you will likely be able to view your results before I have a chance to respond to them. Please give us  5 business days to respond to any non-urgent results.   Maintenance - Resume Breztri  2 puffs twice daily, gargle after use  Rescue - albuterol  inhaler 2 puffs up to 4 times a day, gargle after use. Or use nebulizer treatments up to 4 times a day.   I am referring you to [facility]. [facility] Pulmonary rehab call Phone [phone]

## 2024-05-07 ENCOUNTER — Telehealth: Payer: Self-pay | Admitting: *Deleted

## 2024-05-07 NOTE — Telephone Encounter (Signed)
 Copied from CRM [id number]. Topic: General - Other >> Apr 24, 2024  4:30 PM Kara Newman wrote: Reason for CRM: patient would like dr  meade to return her phonecall , I asked if there was anything specific, she said no please call [phone].  I called and spoke with patient, she could not remember why she called, however, she thought she called about results.  I advised her that Dr. meade saw her on 6/12 and she did not order any lab work or imaging (x-ray or CT scan).  She said she has not heard from pulmonary rehab yet.  I advised her that it has to get approved by insurance and then sometimes there is a waiting list to get in.  I advised her to call us  back if she needed anything further.  She verbalized understanding.  Nothing further needed.

## 2024-06-05 ENCOUNTER — Ambulatory Visit: Payer: Self-pay | Admitting: Nurse Practitioner

## 2024-06-05 ENCOUNTER — Ambulatory Visit (INDEPENDENT_AMBULATORY_CARE_PROVIDER_SITE_OTHER): Admitting: Nurse Practitioner

## 2024-06-05 ENCOUNTER — Encounter: Payer: Self-pay | Admitting: Nurse Practitioner

## 2024-06-05 ENCOUNTER — Ambulatory Visit (INDEPENDENT_AMBULATORY_CARE_PROVIDER_SITE_OTHER)

## 2024-06-05 VITALS — BP 114/60 | HR 76 | Ht 66.0 in | Wt 130.6 lb

## 2024-06-05 DIAGNOSIS — E8770 Fluid overload, unspecified: Secondary | ICD-10-CM

## 2024-06-05 DIAGNOSIS — J441 Chronic obstructive pulmonary disease with (acute) exacerbation: Secondary | ICD-10-CM | POA: Diagnosis not present

## 2024-06-05 DIAGNOSIS — J019 Acute sinusitis, unspecified: Secondary | ICD-10-CM | POA: Diagnosis not present

## 2024-06-05 DIAGNOSIS — R0609 Other forms of dyspnea: Secondary | ICD-10-CM

## 2024-06-05 MED ORDER — PROMETHAZINE-DM 6.25-15 MG/5ML PO SYRP: 5.0000 mL | ORAL_SOLUTION | Freq: Four times a day (QID) | ORAL | 0 refills | Status: DC | PRN

## 2024-06-05 MED ORDER — DOXYCYCLINE HYCLATE 100 MG PO TABS: 100.0000 mg | ORAL_TABLET | Freq: Two times a day (BID) | ORAL | 0 refills | Status: DC

## 2024-06-05 MED ORDER — PREDNISONE 10 MG PO TABS: ORAL_TABLET | ORAL | 0 refills | Status: DC

## 2024-06-05 MED ORDER — BENZONATATE 200 MG PO CAPS
200.0000 mg | ORAL_CAPSULE | Freq: Three times a day (TID) | ORAL | 1 refills | Status: DC | PRN
Start: 2024-06-05 — End: 2024-08-02

## 2024-06-05 MED ORDER — ALBUTEROL SULFATE (2.5 MG/3ML) 0.083% IN NEBU
2.5000 mg | INHALATION_SOLUTION | Freq: Four times a day (QID) | RESPIRATORY_TRACT | 5 refills | Status: DC | PRN
Start: 2024-06-05 — End: 2024-08-02

## 2024-06-05 NOTE — Patient Instructions (Addendum)
 Continue Albuterol  inhaler 2 puffs or 3 mL neb every 6 hours as needed for shortness of breath or wheezing. Notify if symptoms persist despite rescue inhaler/neb use.  Continue Breztri  2 puffs Twice daily. Brush tongue and rinse mouth afterwards  -Prednisone  taper. 4 tabs for 2 days, then 3 tabs for 2 days, 2 tabs for 2 days, then 1 tab for 2 days, then stop. Take in AM with food -Doxycycline  1 tab Twice daily for 7 days. Take with food. Wear sunscreen when outside as this can make you more prone to sunburns -Benzonatate  1 capsule Three times a day as needed for cough -Promethazine  DM cough syrup 5 mL every 6 hours as needed for cough -Guaifenesin 600 mg Twice daily for cough/congestion -Flonase nasal spray 2 sprays each nostril daily for nasal congestion/drainage   Chest x ray today  Follow up in 2-3 weeks with Kara Newman or Kara Roxann Vierra,NP. If symptoms do not improve or worsen, please contact office for sooner follow up or seek emergency care.

## 2024-06-05 NOTE — Progress Notes (Unsigned)
 @Patient  ID: Kara Newman, female    DOB: 1946-10-23, 78 y.o.   MRN: [id number]  Chief Complaint  Patient presents with   Cough    Coughing up yellow/white thick sputum.No improvement    Referring provider: Elliot Charm  HPI: 78 year old female, former smoker followed for COPD. She is a patient of Dr. Correne and last seen in office 04/24/2024. Past medical history significant for HTN, allergies, hypothyroid, HLD, anxiety.   TEST/EVENTS:   06/05/2024: Today - acute Discussed the use of AI scribe software for clinical note transcription with the patient, who gave verbal consent to proceed.  History of Present Illness Kara Newman is an 78 year old female with chronic respiratory issues who presents with a persistent cough and breathing difficulties.   She has experienced a persistent cough for four weeks, which began after her last visit with Dr. Meade. The cough is productive, especially in the mornings, with yellow sputum. No recent exposure to sick contacts as she lives alone. No recent fevers or weight loss. She experiences wheezing but no hemoptysis. Her voice hoarseness is not new.  She reports that her breathing is about the same as at her last visit, although she occasionally needs to use her Breztri  inhaler more than the prescribed two puffs twice a day. She does not have a nebulizer machine at home.  She uses Zyrtec for allergies but does not use nasal sprays. She recently started using Mucinex DM for her cough, which has not been effective. She reports a lot of sinus drainage, which started around the same time. No facial pain,sore throat.     Allergies  Allergen Reactions   Ace Inhibitors     Other reaction(s): Cough (ALLERGY/intolerance)   Benzalkonium Chloride     Other reaction(s): Other (See Comments), Other (See Comments) Caused an infection Caused an infection    Levothyroxine Sodium     Other reaction(s): troubles in the past, wants brand  Synthroid   Latex Rash    Immunization History  Administered Date(s) Administered   Influenza Split 08/13/2012   Influenza, High Dose Seasonal PF 07/26/2018   Moderna Sars-Covid-2 Vaccination 12/01/2019, 12/29/2019   Pneumococcal Conjugate-13 07/05/2018   Pneumococcal Polysaccharide-23 07/22/2009   Td 08/18/2018    Past Medical History:  Diagnosis Date   Breast cancer (HCC)    had left mastectomy.    COPD (chronic obstructive pulmonary disease) (HCC)    Endometriosis     Tobacco History: Social History   Tobacco Use  Smoking Status Former   Current packs/day: 0.00   Average packs/day: 1 pack/day for 38.0 years (38.0 ttl pk-yrs)   Types: Cigarettes   Start date: 11/06/1962   Quit date: 11/06/2000   Years since quitting: 23.5  Smokeless Tobacco Never   Counseling given: Not Answered   Outpatient Medications Prior to Visit  Medication Sig Dispense Refill   albuterol  (VENTOLIN  HFA) 108 (90 Base) MCG/ACT inhaler Inhale 2 puffs into the lungs every 4 (four) hours as needed for wheezing or shortness of breath. 72 each 3   amLODipine (NORVASC) 5 MG tablet Take 5 mg by mouth daily.     atorvastatin (LIPITOR) 20 MG tablet Take 20 mg by mouth daily. (Patient not taking: Reported on 06/05/2024)     azithromycin  (ZITHROMAX  Z-PAK) 250 MG tablet Take 2 tablets day 1 and then 1 daily for 4 days (Patient not taking: Reported on 06/05/2024) 6 each 0   budesonide-glycopyrrolate-formoterol (BREZTRI  AEROSPHERE) 160-9-4.8 MCG/ACT AERO inhaler Inhale  2 puffs into the lungs in the morning and at bedtime. 3 each 3   chlorthalidone (HYGROTON) 25 MG tablet TK 1 T PO QAM     losartan (COZAAR) 25 MG tablet Take 25 mg by mouth daily.     meclizine (ANTIVERT) 25 MG tablet Take 25 mg by mouth 2 (two) times daily as needed.     potassium chloride (KLOR-CON M) 10 MEQ tablet Take 10 mEq by mouth once.     predniSONE  (DELTASONE ) 20 MG tablet Take 1 tablet (20 mg total) by mouth daily with breakfast. 7  tablet 0   SYNTHROID 112 MCG tablet Take 112 mcg by mouth daily.     No facility-administered medications prior to visit.     Review of Systems:   Constitutional: No weight loss or gain, night sweats, fevers, chills, fatigue, or lassitude. HEENT: No headaches, difficulty swallowing, tooth/dental problems, or sore throat. No sneezing, itching, ear ache, nasal congestion, or post nasal drip CV:  No chest pain, orthopnea, PND, swelling in lower extremities, anasarca, dizziness, palpitations, syncope Resp: No shortness of breath with exertion or at rest. No excess mucus or change in color of mucus. No productive or non-productive. No hemoptysis. No wheezing.  No chest wall deformity GI:  No heartburn, indigestion, abdominal pain, nausea, vomiting, diarrhea, change in bowel habits, loss of appetite, bloody stools.  GU: No dysuria, change in color of urine, urgency or frequency.  No flank pain, no hematuria  Skin: No rash, lesions, ulcerations MSK:  No joint pain or swelling.  No decreased range of motion.  No back pain. Neuro: No dizziness or lightheadedness.  Psych: No depression or anxiety. Mood stable.     Physical Exam:  BP 114/60 (BP Location: Left Arm, Patient Position: Sitting, Cuff Size: Normal)   Pulse 76   Ht 5' 6 (1.676 m)   Wt 130 lb 9.6 oz (59.2 kg)   SpO2 91%   BMI 21.08 kg/m   GEN: Pleasant, interactive, well-nourished/chronically-ill appearing/acutely-ill appearing/poorly-nourished/morbidly obese; in no acute distress.****** HEENT:  Normocephalic and atraumatic. EACs patent bilaterally. TM pearly gray with present light reflex bilaterally. PERRLA. Sclera white. Nasal turbinates pink, moist and patent bilaterally. No rhinorrhea present. Oropharynx pink and moist, without exudate or edema. No lesions, ulcerations, or postnasal drip.  NECK:  Supple w/ fair ROM. No JVD present. Normal carotid impulses w/o bruits. Thyroid  symmetrical with no goiter or nodules palpated. No  lymphadenopathy.   CV: RRR, no m/r/g, no peripheral edema. Pulses intact, +2 bilaterally. No cyanosis, pallor or clubbing. PULMONARY:  Unlabored, regular breathing. Clear bilaterally A&P w/o wheezes/rales/rhonchi. No accessory muscle use.  GI: BS present and normoactive. Soft, non-tender to palpation. No organomegaly or masses detected. No CVA tenderness. MSK: No erythema, warmth or tenderness. Cap refil <2 sec all extrem. No deformities or joint swelling noted.  Neuro: A/Ox3. No focal deficits noted.   Skin: Warm, no lesions or rashe Psych: Normal affect and behavior. Judgement and thought content appropriate.     Lab Results:  CBC    Component Value Date/Time   WBC 10.7 (H) 08/02/2023 1331   RBC 4.18 08/02/2023 1331   HGB 12.4 08/02/2023 1331   HCT 37.7 08/02/2023 1331   PLT 583 (H) 08/02/2023 1331   MCV 90.2 08/02/2023 1331   MCH 29.7 08/02/2023 1331   MCHC 32.9 08/02/2023 1331   RDW 12.0 08/02/2023 1331   LYMPHSABS 1.3 08/02/2023 1331   MONOABS 1.1 (H) 08/02/2023 1331   EOSABS 0.1 08/02/2023 1331  BASOSABS 0.1 08/02/2023 1331    BMET    Component Value Date/Time   NA 135 06/30/2023 1031   K 3.3 (L) 06/30/2023 1031   CL 95 (L) 06/30/2023 1031   CO2 31 06/30/2023 1031   GLUCOSE 105 (H) 06/30/2023 1031   BUN 14 06/30/2023 1031   CREATININE 0.96 06/30/2023 1031   CALCIUM 8.9 06/30/2023 1031   GFRNONAA >60 06/30/2023 1031    BNP No results found for: BNP   Imaging:  No results found.  Administration History     None          Latest Ref Rng & Units 05/13/2020    2:51 PM  PFT Results  FVC-Pre L 1.98   FVC-Predicted Pre % 65   FVC-Post L 2.14   FVC-Predicted Post % 70   Pre FEV1/FVC % % 54   Post FEV1/FCV % % 58   FEV1-Pre L 1.07   FEV1-Predicted Pre % 47   FEV1-Post L 1.24   DLCO uncorrected ml/min/mmHg 14.06   DLCO UNC% % 69   DLCO corrected ml/min/mmHg 14.06   DLCO COR %Predicted % 69   DLVA Predicted % 82   TLC L 6.15   TLC % Predicted %  114   RV % Predicted % 160     No results found for: NITRICOXIDE      Assessment & Plan:   No problem-specific Assessment & Plan notes found for this encounter.   Advised if symptoms do not improve or worsen, to please contact office for sooner follow up or seek emergency care.   I spent *** minutes of dedicated to the care of this patient on the date of this encounter to include pre-visit review of records, face-to-face time with the patient discussing conditions above, post visit ordering of testing, clinical documentation with the electronic health record, making appropriate referrals as documented, and communicating necessary findings to members of the patients care team.  Comer LULLA Rouleau, NP 06/05/2024  Pt aware and understands NP's role.

## 2024-06-06 ENCOUNTER — Other Ambulatory Visit

## 2024-06-06 ENCOUNTER — Encounter: Payer: Self-pay | Admitting: Nurse Practitioner

## 2024-06-06 DIAGNOSIS — R0609 Other forms of dyspnea: Secondary | ICD-10-CM | POA: Diagnosis not present

## 2024-06-06 DIAGNOSIS — E8770 Fluid overload, unspecified: Secondary | ICD-10-CM | POA: Diagnosis not present

## 2024-06-06 LAB — BASIC METABOLIC PANEL WITH GFR
BUN: 16 mg/dL (ref 6–23)
CO2: 31 meq/L (ref 19–32)
Calcium: 9.5 mg/dL (ref 8.4–10.5)
Chloride: 94 meq/L — ABNORMAL LOW (ref 96–112)
Creatinine, Ser: 0.91 mg/dL (ref 0.40–1.20)
GFR: 59.59 mL/min — ABNORMAL LOW (ref >60.00)
Glucose, Bld: 156 mg/dL — ABNORMAL HIGH (ref 70–99)
Potassium: 3.1 meq/L — ABNORMAL LOW (ref 3.5–5.1)
Sodium: 133 meq/L — ABNORMAL LOW (ref 135–145)

## 2024-06-06 LAB — BRAIN NATRIURETIC PEPTIDE: Pro B Natriuretic peptide (BNP): 92 pg/mL (ref 0.0–100.0)

## 2024-06-09 ENCOUNTER — Ambulatory Visit: Payer: Self-pay | Admitting: Nurse Practitioner

## 2024-06-09 NOTE — Progress Notes (Signed)
 BNP borderline but considered normal. She does have a lower sodium level but nothing that would cause any symptoms or cause for concern. No evidence of fluid overload on prior exam. Potassium is low - recommend she take an extra dose of potassium (total 20 meq) for the next 3 days then return to 10 meq daily. Follow up with her PCP to recheck labs and make adjustments, as necessary. Thanks.

## 2024-06-10 NOTE — Telephone Encounter (Signed)
 PT ret call. No reply from Triage on teams. . Please call back.

## 2024-06-10 NOTE — Progress Notes (Signed)
 I spoke with the pt and notified of results/recs per Izetta. She verbalized understanding.

## 2024-06-24 ENCOUNTER — Encounter: Payer: Self-pay | Admitting: Pulmonary Disease

## 2024-06-24 ENCOUNTER — Ambulatory Visit (INDEPENDENT_AMBULATORY_CARE_PROVIDER_SITE_OTHER): Admitting: Pulmonary Disease

## 2024-06-24 VITALS — BP 163/82 | HR 87 | Ht 65.0 in | Wt 130.0 lb

## 2024-06-24 DIAGNOSIS — J441 Chronic obstructive pulmonary disease with (acute) exacerbation: Secondary | ICD-10-CM | POA: Diagnosis not present

## 2024-06-24 MED ORDER — AZITHROMYCIN 250 MG PO TABS
ORAL_TABLET | ORAL | 0 refills | Status: DC
Start: 2024-06-24 — End: 2024-08-02

## 2024-06-24 MED ORDER — PREDNISONE 10 MG PO TABS: 60.0000 mg | ORAL_TABLET | Freq: Every day | ORAL | 0 refills | Status: DC

## 2024-06-24 MED ORDER — PROMETHAZINE-DM 6.25-15 MG/5ML PO SYRP: 5.0000 mL | ORAL_SOLUTION | Freq: Four times a day (QID) | ORAL | 0 refills | Status: DC | PRN

## 2024-06-24 NOTE — Patient Instructions (Signed)
  VISIT SUMMARY: Today, you were seen for worsening shortness of breath related to your severe COPD. We discussed your symptoms, including your persistent cough, lower extremity swelling, and feeling cold. We reviewed your recent treatments and diagnostic evaluations, and made adjustments to your medication regimen to better manage your condition.  YOUR PLAN: -COPD WITH ACUTE EXACERBATION AND SUSPECTED PULMONARY INFECTION: You have severe COPD, which is a chronic lung condition that makes it hard to breathe. Your symptoms have worsened recently, and a chest x-ray suggests you might have an infection. We are prescribing prednisone  60 mg daily and azithromycin  (Z-Pak) to help reduce inflammation and treat any potential infection. We are also giving you promethazine  with dextromethorphan cough syrup to help with your cough and recommending Mucinex for congestion.  -HYPOKALEMIA: Hypokalemia means you have low potassium levels in your blood. We have already increased your potassium supplements to two pills daily. We will order a blood test to check your potassium levels to ensure they are improving.  INSTRUCTIONS: Please follow the new medication regimen as prescribed. We will need to check your potassium levels with a blood test, so please schedule this at your earliest convenience. If your potassium levels are stable, we may start you on Lasix to help with the swelling in your legs. Continue to monitor your symptoms and follow up with us  if you notice any changes or if your symptoms do not improve.

## 2024-06-24 NOTE — Progress Notes (Addendum)
 Kara Newman    [id number]    02/24/1946  Primary Care Physician:Newman, Valery  Referring Physician: Elliot Valery No address on file  Chief complaint: Acute visit for COPD exacerbation  HPI: 78 y.o. who  has a past medical history of Breast cancer (HCC), COPD (chronic obstructive pulmonary disease) (HCC), and Endometriosis.  Discussed the use of AI scribe software for clinical note transcription with the patient, who gave verbal consent to proceed.  History of Present Illness Kara Newman is an 78 year old female with severe COPD who presents with worsening shortness of breath. She was referred by Dr. Meade for management of her COPD.  Dyspnea - Severe chronic obstructive pulmonary disease with significant worsening of shortness of breath - Shortness of breath refractory to nebulizer treatments - Exacerbations occur approximately four to five times daily - Recent treatments with doxycycline  and prednisone  (started June 05, 2024) did not improve symptoms  Cough and sputum production - Persistent cough, often unproductive - Occasionally able to expectorate mucus after significant effort - Tessalon  Perles available for cough - Ran out of promethazine  and dextromethorphan cough syrup and unable to renew  Peripheral edema - Significant bilateral lower extremity swelling, described as abnormal for her - Not currently taking furosemide (Lasix)  Cold sensation - Frequent sensation of feeling cold  Hypokalemia - Low potassium levels - Currently taking two potassium chloride (Chlorcon) tablets daily instead of one  Medication history and allergies - Current COPD regimen includes Breztri , albuterol  inhaler, and nebulizer treatments - No known allergies to antibiotics - Uncertain prior use of azithromycin  (Z-Pak)  Recent diagnostic evaluation - Chest x-ray performed on June 05, 2024, as part of recent evaluation    Outpatient Encounter  Medications as of 06/24/2024  Medication Sig   albuterol  (PROVENTIL ) (2.5 MG/3ML) 0.083% nebulizer solution Take 3 mLs (2.5 mg total) by nebulization every 6 (six) hours as needed.   albuterol  (VENTOLIN  HFA) 108 (90 Base) MCG/ACT inhaler Inhale 2 puffs into the lungs every 4 (four) hours as needed for wheezing or shortness of breath.   amLODipine (NORVASC) 5 MG tablet Take 5 mg by mouth daily.   atorvastatin (LIPITOR) 20 MG tablet Take 20 mg by mouth daily.   benzonatate  (TESSALON ) 200 MG capsule Take 1 capsule (200 mg total) by mouth 3 (three) times daily as needed for cough.   budesonide-glycopyrrolate-formoterol (BREZTRI  AEROSPHERE) 160-9-4.8 MCG/ACT AERO inhaler Inhale 2 puffs into the lungs in the morning and at bedtime.   chlorthalidone (HYGROTON) 25 MG tablet TK 1 T PO QAM   losartan (COZAAR) 25 MG tablet Take 25 mg by mouth daily.   meclizine (ANTIVERT) 25 MG tablet Take 25 mg by mouth 2 (two) times daily as needed.   potassium chloride (KLOR-CON M) 10 MEQ tablet Take 10 mEq by mouth once.   promethazine -dextromethorphan (PROMETHAZINE -DM) 6.25-15 MG/5ML syrup Take 5 mLs by mouth 4 (four) times daily as needed for cough.   SYNTHROID 112 MCG tablet Take 112 mcg by mouth daily.   azithromycin  (ZITHROMAX  Z-PAK) 250 MG tablet Take 2 tablets day 1 and then 1 daily for 4 days (Patient not taking: Reported on 06/24/2024)   doxycycline  (VIBRA -TABS) 100 MG tablet Take 1 tablet (100 mg total) by mouth 2 (two) times daily. (Patient not taking: Reported on 06/24/2024)   predniSONE  (DELTASONE ) 10 MG tablet 4 tabs for 2 days, then 3 tabs for 2 days, 2 tabs for 2 days, then 1 tab for 2  days, then stop (Patient not taking: Reported on 06/24/2024)   No facility-administered encounter medications on file as of 06/24/2024.     Physical Exam: Today's Vitals   06/24/24 1523  BP: (!) 163/82  Pulse: 87  SpO2: 91%  Weight: 130 lb (59 kg)  Height: 5' 5 (1.651 m)   Body mass index is 21.63 kg/m.  Physical  Exam GEN: No acute distress. CV: Regular rate and rhythm, no murmurs. LUNGS: Crackles present, minimal wheeze. SKIN JOINTS: Warm and dry, no rash. EXTREMITIES: No edema in feet.    Data Reviewed: Imaging: Chest x-ray 06/05/2024-bilateral interstitial opacities, trace bilateral effusion I reviewed the images.SABRA  PFTs:  Labs:  Assessment and Plan Assessment & Plan COPD with acute exacerbation and suspected pulmonary infection Severe COPD with acute exacerbation, presenting with increased dyspnea and cough.  Recent chest x-ray reveals interstitial opacities, suggesting infection or edema. Differential diagnosis includes infection versus heart failure; however, normal BNP and absence of peripheral edema favor infection. Previous doxycycline  and prednisone  treatment was ineffective. Auscultation reveals crackles, indicating possible ongoing infection. - Prescribe prednisone  60 mg daily - Prescribe azithromycin  (Z-Pak) - Prescribe promethazine  with dextromethorphan cough syrup - Recommend Mucinex for congestion  Hypokalemia Low potassium levels, previously addressed with increased potassium supplementation to two pills daily. - Order blood test to check potassium levels  Hypoxemia Order supplemental oxygen SATURATION QUALIFICATIONS: (This note is used to comply with regulatory documentation for home oxygen)   Patient Saturations on Room Air at Rest = 93%   Patient Saturations on Room Air while Ambulating = 86%   Patient Saturations on 3 Liters of oxygen while Ambulating = 95%   Please briefly explain why patient needs home oxygen:patient O2 dropped to 86 % in less than one lap placed on 2/3 L O2 went back up to 95 %   Recommendations: Z-Pak, additional prednisone  taper Promethazine  DM cough syrup, Mucinex Check metabolic panel Supplemental oxygen   Lonna Coder MD [facility] 06/24/2024, 4:01 PM  CC: Newman, Kara

## 2024-06-25 LAB — BASIC METABOLIC PANEL WITH GFR
BUN: 11 mg/dL (ref 6–23)
CO2: 27 meq/L (ref 19–32)
Calcium: 8.8 mg/dL (ref 8.4–10.5)
Chloride: 97 meq/L (ref 96–112)
Creatinine, Ser: 0.75 mg/dL (ref 0.40–1.20)
GFR: 75.13 mL/min (ref >60.00)
Glucose, Bld: 95 mg/dL (ref 70–99)
Potassium: 4.5 meq/L (ref 3.5–5.1)
Sodium: 132 meq/L — ABNORMAL LOW (ref 135–145)

## 2024-06-26 ENCOUNTER — Telehealth: Payer: Self-pay

## 2024-06-26 DIAGNOSIS — J441 Chronic obstructive pulmonary disease with (acute) exacerbation: Secondary | ICD-10-CM

## 2024-06-26 NOTE — Telephone Encounter (Signed)
 We are awaiting Dr. Kai response.

## 2024-06-26 NOTE — Telephone Encounter (Signed)
 Copied from CRM [id number]. Topic: Clinical - Order For Equipment >> Jun 25, 2024 11:23 AM Rilla NOVAK wrote: Patient saw Dr Theophilus on yesterday and cannot remember the suppliers name for the equipment that he was going to order for her. Patient is anxious about this. Also, want to know if we handle life alerts or who would you recommend?  Please call patient [phone] Theresa Barters)   ----------------------------------------------------------------------- From previous Reason for Contact - Other: Reason for CRM: >> Jun 26, 2024 11:35 AM Corean SAUNDERS wrote: Patients friend/ caretaker is very frustrated that the patients oxygen unit for home AND portable oxygen concentrator has not been ordered to [facility] as the patient is unwell and frantic without it, Slater is requesting Dr. Kai nurse to call her as soon as this is completed and if not states she will come to the clinic.  >> Jun 25, 2024  4:17 PM Rozanna G wrote: FRIEND STATED PT IS VERY ANXIOUS ABOUT GETTING THE OXYGEN CONCENTRATOR AND TRAVEL PACK.  >> Jun 25, 2024  4:16 PM Rozanna G wrote: PT FRIEND JANE CALLED BACK ABOUT THE PREVIOUS MESSAGE SENT ADVISED HER THAT SOMEONE SHOULD REACH OUT TODAY

## 2024-06-26 NOTE — Telephone Encounter (Signed)
 Copied from CRM [id number]. Topic: Clinical - Order For Equipment >> Jun 25, 2024 11:23 AM Rilla NOVAK wrote: Patient saw Dr Theophilus on yesterday and cannot remember the suppliers name for the equipment that he was going to order for her. Patient is anxious about this. Also, want to know if we handle life alerts or who would you recommend?  Please call patient [phone] Theresa Barters)   ----------------------------------------------------------------------- From previous Reason for Contact - Other: Reason for CRM: >> Jun 25, 2024  4:17 PM Rozanna MATSU wrote: FRIEND STATED PT IS VERY ANXIOUS ABOUT GETTING THE OXYGEN CONCENTRATOR AND TRAVEL PACK.  >> Jun 25, 2024  4:16 PM Rozanna G wrote: PT FRIEND JANE CALLED BACK ABOUT THE PREVIOUS MESSAGE SENT ADVISED HER THAT SOMEONE SHOULD REACH OUT TODAY  Patient has came into the office on 06/24/24 for a office visit. Patient was walked and did drop to 86% and no order has been placed for oxygen. Dr,Mannam can you please advise

## 2024-06-26 NOTE — Telephone Encounter (Signed)
 PCC's please process order

## 2024-06-26 NOTE — Telephone Encounter (Signed)
 Called and spoke with the patient. I explained to the pt  I was placing O2 order to [employer]. Pt did not qualify for POC in office, she only qualified for continuous. Pt will need to be titrated through DME company as we have no openings in office to bring the patient back in.  Pt verbalized understanding and is aware. Nothing further needed as of now.

## 2024-06-26 NOTE — Telephone Encounter (Addendum)
 Please order supplemental oxygen as noted during her walk test in clinic Also let her know that her labs done during the clinic visit shows normal potassium levels

## 2024-06-27 ENCOUNTER — Telehealth: Payer: Self-pay

## 2024-06-27 NOTE — Telephone Encounter (Signed)
 Copied from CRM #[id number]. Topic: General - Other >> Jun 27, 2024 12:38 PM Rilla B wrote: Reason for CRM: Patient calling concerning her delivery today. States [employer] did not bring the O2 (delivery driver states he don't deliver O2 and she said it was all suppose to come together) and also, they did not deliver the medication for the nebulizer. Patient needs help  understanding and would like a call back @ [phone]    Patient hs received O2 - Ned sol. Is at [facility] was sent in on 7/24     -NFN

## 2024-06-27 NOTE — Telephone Encounter (Signed)
 Patient's DME order has been received by [employer]. NFN

## 2024-06-30 ENCOUNTER — Other Ambulatory Visit: Payer: Self-pay | Admitting: Pulmonary Disease

## 2024-06-30 ENCOUNTER — Other Ambulatory Visit: Payer: Self-pay

## 2024-06-30 DIAGNOSIS — J441 Chronic obstructive pulmonary disease with (acute) exacerbation: Secondary | ICD-10-CM

## 2024-06-30 NOTE — Telephone Encounter (Signed)
 Copied from CRM #[id number]. Topic: Clinical - Medication Question >> Jun 30, 2024 12:24 PM Rozanna G wrote: Reason for CRM: PT IS ASKING FOR REFILL ON THE COUGH SYRUP promethazine -dextromethorphan (PROMETHAZINE -DM) 6.25-15 MG/5ML syrup. AND THEY ARE ALSO STATING THE PHARMACY DOES NOT HAVE THE albuterol  (PROVENTIL ) (2.5 MG/3ML) 0.083% nebulizer solution ADVISED ITS AT THE PHARMACY  Patient is still having a cough and requesting PROMETHAZINE -DM) 6.25-15 MG/5ML syrup.Patient has been able to get her Nebulizer medication from the pharmacy . Dr.Desai can you please refill patient's cough medication.

## 2024-07-01 ENCOUNTER — Telehealth: Payer: Self-pay

## 2024-07-01 NOTE — Telephone Encounter (Signed)
 Copied from CRM [id number]. Topic: General - Other >> Jun 30, 2024 12:38 PM Shona S wrote: Reason for CRM: chris thompson, calling in reference to patient. Patient received oxygen machin but they need to know how much she should be using. Please call [phone].   Spoke w/ PT and son 2-3L   -NFN   >> Jun 30, 2024  1:27 PM CMA Warren ORN wrote: Pt VM box is not set up according to LOV she was to be using 2-3L of oxygen

## 2024-07-04 MED ORDER — PROMETHAZINE-DM 6.25-15 MG/5ML PO SYRP: 5.0000 mL | ORAL_SOLUTION | Freq: Four times a day (QID) | ORAL | 0 refills | Status: DC | PRN

## 2024-07-24 ENCOUNTER — Encounter: Payer: Self-pay | Admitting: Nurse Practitioner

## 2024-07-24 ENCOUNTER — Ambulatory Visit (INDEPENDENT_AMBULATORY_CARE_PROVIDER_SITE_OTHER): Admitting: Nurse Practitioner

## 2024-07-24 VITALS — BP 112/70 | HR 90 | Temp 98.7°F | Ht 65.0 in | Wt 130.6 lb

## 2024-07-24 DIAGNOSIS — J310 Chronic rhinitis: Secondary | ICD-10-CM

## 2024-07-24 DIAGNOSIS — J3089 Other allergic rhinitis: Secondary | ICD-10-CM

## 2024-07-24 DIAGNOSIS — J441 Chronic obstructive pulmonary disease with (acute) exacerbation: Secondary | ICD-10-CM | POA: Diagnosis not present

## 2024-07-24 DIAGNOSIS — E8770 Fluid overload, unspecified: Secondary | ICD-10-CM

## 2024-07-24 DIAGNOSIS — R0609 Other forms of dyspnea: Secondary | ICD-10-CM

## 2024-07-24 MED ORDER — FUROSEMIDE 40 MG PO TABS: 40.0000 mg | ORAL_TABLET | Freq: Every day | ORAL | 5 refills | Status: DC

## 2024-07-24 NOTE — Progress Notes (Addendum)
 @Patient  ID: Kara Newman, female    DOB: [birth year], 78 y.o.   MRN: [id number]  Chief Complaint  Patient presents with   Medical Management of Chronic Issues    COPD swollen feet started 2-3 weeks and belly is really tight and sitting at home really tired and energy level is a 2 or 1 and tightness in stomach is pushing against the diaphragm      Referring provider: Elliot Charm  HPI: 78 year old female, former smoker followed for COPD. She is a patient of Dr. Correne and last seen in office 04/24/2024. Past medical history significant for HTN, allergies, hypothyroid, HLD, anxiety.   TEST/EVENTS:  05/13/2020 PFT: FVC 65, FEV1 47, ratio 58, TLC 114, DLCO 69. Positive bronchodilator response. Severe obstruction with moderate diffusion defect and air trapping  04/24/2024: OV with Dr. Meade. Annual COPD f/u. Lives in [place] year round and comes back to [facility]. Feels breathing is getting worse. Has had more falls. Using breztri  more than prescribed - encouraged to only use twice daily. Refilled albuterol . Never got pulmonary rehab- referral placed.  06/05/2024: SHERLEAN with Ming Mcmannis NP Discussed the use of AI scribe software for clinical note transcription with the patient, who gave verbal consent to proceed. [name] is an 78 year old female with chronic respiratory issues who presents with a persistent cough and breathing difficulties.  She has experienced a persistent cough for four weeks, which began after her last visit with Dr. Meade. The cough is productive, especially in the mornings, with yellow sputum. No recent exposure to sick contacts as she lives alone. No recent fevers or weight loss. She experiences wheezing but no hemoptysis. Her voice hoarseness is not new. She reports that her breathing is about the same as at her last visit, although she occasionally needs to use her Breztri  inhaler more than the prescribed two puffs twice a day. She does not have a nebulizer machine at  home. She uses Zyrtec for allergies but does not use nasal sprays. She recently started using Mucinex DM for her cough, which has not been effective. She reports a lot of sinus drainage, which started around the same time. No facial pain,sore throat.   07/24/2024: Today - follow up Discussed the use of AI scribe software for clinical note transcription with the patient, who gave verbal consent to proceed.  History of Present Illness Kara Newman is an 78 year old female with COPD who presents with significant swelling and breathing difficulties.  She has been experiencing significant swelling over the past four weeks, initially in her ankles and now progressing to her abdomen.  Her history of COPD has been managed with treatments for flare-ups in the past. She experiences coughing fits, especially when lying down. She has been treated with medications for coughing, which initially helped, but the coughing persists. No increase in oxygen use, hemoptysis, or fevers.   She has not been on any diuretics yet. Her sodium levels were slightly low during her last lab work; BNP is normal. She has not experienced any significant weight gain, but her appetite is poor, and she is eating very little.  She is experiencing severe constipation, having not had a normal bowel movement for weeks despite trying Miralax and stool softeners. Her abdomen feels very bloated. No nausea, vomiting, abdominal pain, bloody stools.   Sinuses are better. No issues with congestion or drainage.  No increased oxygen requirements.      Allergies  Allergen Reactions  Ace Inhibitors     Other reaction(s): Cough (ALLERGY/intolerance)   Benzalkonium Chloride     Other reaction(s): Other (See Comments), Other (See Comments) Caused an infection Caused an infection    Levothyroxine  Sodium     Other reaction(s): troubles in the past, wants brand Synthroid    Latex Rash    Immunization History  Administered Date(s)  Administered   INFLUENZA, HIGH DOSE SEASONAL PF 07/26/2018   Influenza Split 08/13/2012   Moderna Sars-Covid-2 Vaccination 12/01/2019, 12/29/2019   Pneumococcal Conjugate-13 07/05/2018   Pneumococcal Polysaccharide-23 07/22/2009   Td 08/18/2018    Past Medical History:  Diagnosis Date   Breast cancer (HCC)    had left mastectomy.    COPD (chronic obstructive pulmonary disease) (HCC)    Endometriosis     Tobacco History: Social History   Tobacco Use  Smoking Status Former   Current packs/day: 0.00   Average packs/day: 1 pack/day for 38.0 years (38.0 ttl pk-yrs)   Types: Cigarettes   Start date: 11/06/1962   Quit date: 11/06/2000   Years since quitting: 23.7  Smokeless Tobacco Never   Counseling given: Not Answered   Outpatient Medications Prior to Visit  Medication Sig Dispense Refill   albuterol  (PROVENTIL ) (2.5 MG/3ML) 0.083% nebulizer solution Take 3 mLs (2.5 mg total) by nebulization every 6 (six) hours as needed. 75 mL 5   albuterol  (VENTOLIN  HFA) 108 (90 Base) MCG/ACT inhaler Inhale 2 puffs into the lungs every 4 (four) hours as needed for wheezing or shortness of breath. 72 each 3   amLODipine (NORVASC) 5 MG tablet Take 5 mg by mouth daily.     atorvastatin (LIPITOR) 20 MG tablet Take 20 mg by mouth daily.     azithromycin  (ZITHROMAX ) 250 MG tablet Take 500 mg on day 1 and then 250 mg/day for the next 4 days 6 tablet 0   benzonatate  (TESSALON ) 200 MG capsule Take 1 capsule (200 mg total) by mouth 3 (three) times daily as needed for cough. 30 capsule 1   budesonide -glycopyrrolate -formoterol  (BREZTRI  AEROSPHERE) 160-9-4.8 MCG/ACT AERO inhaler Inhale 2 puffs into the lungs in the morning and at bedtime. 3 each 3   chlorthalidone (HYGROTON) 25 MG tablet TK 1 T PO QAM     losartan (COZAAR) 25 MG tablet Take 25 mg by mouth daily.     meclizine (ANTIVERT) 25 MG tablet Take 25 mg by mouth 2 (two) times daily as needed.     potassium chloride  (KLOR-CON  M) 10 MEQ tablet Take  10 mEq by mouth once.     predniSONE  (DELTASONE ) 10 MG tablet Take 6 tablets (60 mg total) by mouth daily with breakfast. Take 60 mg/day on day 1 and reduce dose by 10 mg every 2 days until taper is complete 50 tablet 0   promethazine -dextromethorphan (PROMETHAZINE -DM) 6.25-15 MG/5ML syrup Take 5 mLs by mouth 4 (four) times daily as needed for cough. 118 mL 0   SYNTHROID  112 MCG tablet Take 112 mcg by mouth daily.     doxycycline  (VIBRA -TABS) 100 MG tablet Take 1 tablet (100 mg total) by mouth 2 (two) times daily. (Patient not taking: Reported on 07/24/2024) 14 tablet 0   No facility-administered medications prior to visit.     Review of Systems: As above; otherwise, negative    Physical Exam:  BP 112/70   Pulse 90   Temp 98.7 F (37.1 C) (Oral)   Ht 5' 5 (1.651 m)   Wt 130 lb 9.6 oz (59.2 kg)   SpO2 98%  BMI 21.73 kg/m   GEN: Pleasant, interactive, well-appearing; in no acute distress HEENT:  Normocephalic and atraumatic. PERRLA. Sclera white. Nasal turbinates pink, moist and patent bilaterally. No rhinorrhea present. Oropharynx pink and moist, without exudate or edema. No lesions, ulcerations, or postnasal drip.  NECK:  Supple w/ fair ROM. No JVD present. No lymphadenopathy.   CV: RRR, no m/r/g, bilateral LE +1 peripheral edema. Pulses intact, +2 bilaterally. No cyanosis, pallor or clubbing. PULMONARY:  Unlabored, regular breathing. Diminished bilaterally A&P w/o wheezes/rales/rhonchi. No accessory muscle use.  GI: BS present and normoactive. Distended, non-tender to palpation. No organomegaly or masses detected. MSK: No erythema, warmth or tenderness. Cap refil <2 sec all extrem.  Neuro: A/Ox3. No focal deficits noted.   Skin: Warm, no lesions or rashe Psych: Normal affect and behavior. Judgement and thought content appropriate.     Lab Results:  CBC    Component Value Date/Time   WBC 10.7 (H) 08/02/2023 1331   RBC 4.18 08/02/2023 1331   HGB 12.4 08/02/2023 1331    HCT 37.7 08/02/2023 1331   PLT 583 (H) 08/02/2023 1331   MCV 90.2 08/02/2023 1331   MCH 29.7 08/02/2023 1331   MCHC 32.9 08/02/2023 1331   RDW 12.0 08/02/2023 1331   LYMPHSABS 1.3 08/02/2023 1331   MONOABS 1.1 (H) 08/02/2023 1331   EOSABS 0.1 08/02/2023 1331   BASOSABS 0.1 08/02/2023 1331    BMET    Component Value Date/Time   NA 132 (L) 06/24/2024 1618   K 4.5 06/24/2024 1618   CL 97 06/24/2024 1618   CO2 27 06/24/2024 1618   GLUCOSE 95 06/24/2024 1618   BUN 11 06/24/2024 1618   CREATININE 0.75 06/24/2024 1618   CREATININE 0.96 06/30/2023 1031   CALCIUM 8.8 06/24/2024 1618   GFRNONAA >60 06/30/2023 1031    BNP No results found for: BNP   Imaging:  No results found.   Administration History     None          Latest Ref Rng & Units 05/13/2020    2:51 PM  PFT Results  FVC-Pre L 1.98   FVC-Predicted Pre % 65   FVC-Post L 2.14   FVC-Predicted Post % 70   Pre FEV1/FVC % % 54   Post FEV1/FCV % % 58   FEV1-Pre L 1.07   FEV1-Predicted Pre % 47   FEV1-Post L 1.24   DLCO uncorrected ml/min/mmHg 14.06   DLCO UNC% % 69   DLCO corrected ml/min/mmHg 14.06   DLCO COR %Predicted % 69   DLVA Predicted % 82   TLC L 6.15   TLC % Predicted % 114   RV % Predicted % 160     No results found for: NITRICOXIDE      Assessment & Plan:   Assessment & Plan Chronic obstructive pulmonary disease  COPD without exacerbation. Suspect DOE and cough related to volume overload and significant constipation. Differential includes HF and pulmonary hypertension. Recent chest X-ray showed changes of fluid overload. Will challenge with diuretic regimen. No significant bronchospasm on exam or infectious symptoms. Hold on steroids or further abx. Continue bronchodilator regimen. Action plan in place. Repeat imaging at close follow up.  - Prescribe Lasix  20 mg for 5 days to reduce fluid overload - Order echocardiogram to assess cardiac function - Educate on potential side effects  of Lasix , including increased urination, risk of angioedema, and dizziness - Advise taking Lasix  in the morning to minimize nocturia - Instruct to monitor blood pressure at  home and report if systolic BP is 100 or below with symptoms - Refer to cardiology if significant cardiac issues are identified - Continue bronchodilator regimen   Constipation, severe and refractory Severe and refractory constipation contributing to abdominal distension and potentially impacting respiratory function related to pressure on the diaphragm. Previous treatments with Miralax and stool softeners were ineffective. Discussed risks of untreated constipation. ED precautions reviewed. Advised to administer enema at home and notify PCP if no response.  - Recommend Fleet's enema for immediate relief - Advise repeating enema if initial attempt is unsuccessful - Instruct to contact PCP if constipation persists after enema - Discuss potential need for emergency intervention if constipation remains unresolved - Dietary measures reviewed  Hypervolemia See above.  - Recheck BMET in 1 week  - Echocardiogram ordered for further evaluation   Chronic respiratory failure  No increased oxygen requirements. Goal >88-90%.  - Continue supplemental oxygen     Advised if symptoms do not improve or worsen, to please contact office for sooner follow up or seek emergency care.   I spent 35 minutes of dedicated to the care of this patient on the date of this encounter to include pre-visit review of records, face-to-face time with the patient discussing conditions above, post visit ordering of testing, clinical documentation with the electronic health record, making appropriate referrals as documented, and communicating necessary findings to members of the patients care team.  Comer LULLA Rouleau, NP 07/24/2024  Pt aware and understands NP's role.

## 2024-07-24 NOTE — Patient Instructions (Addendum)
 Continue Albuterol  inhaler 2 puffs or 3 mL neb every 6 hours as needed for shortness of breath or wheezing. Notify if symptoms persist despite rescue inhaler/neb use.  Continue Breztri  2 puffs Twice daily. Brush tongue and rinse mouth afterwards   Furosemide  40 mg daily for 5 days. Take in AM.  You need to use an enema and get one from the pharmacy. If you still have not had a bowel movement, you need to call your primary care provider   Come next week for labs   Follow up in 2-3 weeks with Dr. Meade or Kara Alex Mcmanigal,NP. If symptoms do not improve or worsen, please contact office for sooner follow up or seek emergency care.

## 2024-07-30 ENCOUNTER — Ambulatory Visit (HOSPITAL_COMMUNITY)
Admission: RE | Admit: 2024-07-30 | Discharge: 2024-07-30 | Disposition: A | Discharge status: Home / Self Care | Source: Ambulatory Visit | Attending: Nurse Practitioner | Admitting: Nurse Practitioner

## 2024-07-30 DIAGNOSIS — E8770 Fluid overload, unspecified: Secondary | ICD-10-CM | POA: Diagnosis not present

## 2024-07-30 DIAGNOSIS — J449 Chronic obstructive pulmonary disease, unspecified: Secondary | ICD-10-CM | POA: Insufficient documentation

## 2024-07-30 DIAGNOSIS — R0602 Shortness of breath: Secondary | ICD-10-CM | POA: Diagnosis not present

## 2024-07-30 DIAGNOSIS — I083 Combined rheumatic disorders of mitral, aortic and tricuspid valves: Secondary | ICD-10-CM | POA: Insufficient documentation

## 2024-07-30 DIAGNOSIS — I10 Essential (primary) hypertension: Secondary | ICD-10-CM | POA: Diagnosis not present

## 2024-07-30 DIAGNOSIS — R0609 Other forms of dyspnea: Secondary | ICD-10-CM | POA: Diagnosis present

## 2024-07-30 DIAGNOSIS — Z87891 Personal history of nicotine dependence: Secondary | ICD-10-CM | POA: Diagnosis not present

## 2024-07-30 LAB — ECHOCARDIOGRAM COMPLETE
Area-P 1/2: 3.48 cm2
S' Lateral: 2.55 cm
Single Plane A4C EF: 50.4 %

## 2024-07-30 NOTE — Progress Notes (Signed)
  Echocardiogram 2D Echocardiogram has been performed.  Koleen KANDICE Popper, RDCS 07/30/2024, 1:42 PM

## 2024-07-31 ENCOUNTER — Other Ambulatory Visit (INDEPENDENT_AMBULATORY_CARE_PROVIDER_SITE_OTHER)

## 2024-07-31 DIAGNOSIS — D75839 Thrombocytosis, unspecified: Secondary | ICD-10-CM

## 2024-07-31 DIAGNOSIS — E8770 Fluid overload, unspecified: Secondary | ICD-10-CM

## 2024-08-01 LAB — BASIC METABOLIC PANEL WITH GFR
BUN: 21 mg/dL (ref 6–23)
CO2: 32 meq/L (ref 19–32)
Calcium: 9.4 mg/dL (ref 8.4–10.5)
Chloride: 90 meq/L — ABNORMAL LOW (ref 96–112)
Creatinine, Ser: 0.86 mg/dL (ref 0.40–1.20)
GFR: 63.7 mL/min
Glucose, Bld: 125 mg/dL — ABNORMAL HIGH (ref 70–99)
Potassium: 3.2 meq/L — ABNORMAL LOW (ref 3.5–5.1)
Sodium: 130 meq/L — ABNORMAL LOW (ref 135–145)

## 2024-08-02 ENCOUNTER — Emergency Department (HOSPITAL_COMMUNITY)

## 2024-08-02 ENCOUNTER — Encounter (HOSPITAL_COMMUNITY): Payer: Self-pay

## 2024-08-02 ENCOUNTER — Other Ambulatory Visit: Payer: Self-pay

## 2024-08-02 ENCOUNTER — Inpatient Hospital Stay (HOSPITAL_COMMUNITY)
Admission: EM | Admit: 2024-08-02 | Discharge: 2024-08-07 | DRG: 193 | Disposition: A | Discharge status: Home Health | Attending: Internal Medicine | Admitting: Internal Medicine

## 2024-08-02 DIAGNOSIS — R0602 Shortness of breath: Secondary | ICD-10-CM | POA: Diagnosis not present

## 2024-08-02 DIAGNOSIS — Z882 Allergy status to sulfonamides status: Secondary | ICD-10-CM

## 2024-08-02 DIAGNOSIS — D72829 Elevated white blood cell count, unspecified: Secondary | ICD-10-CM | POA: Diagnosis present

## 2024-08-02 DIAGNOSIS — Z801 Family history of malignant neoplasm of trachea, bronchus and lung: Secondary | ICD-10-CM

## 2024-08-02 DIAGNOSIS — I110 Hypertensive heart disease with heart failure: Secondary | ICD-10-CM | POA: Diagnosis present

## 2024-08-02 DIAGNOSIS — J189 Pneumonia, unspecified organism: Secondary | ICD-10-CM | POA: Diagnosis not present

## 2024-08-02 DIAGNOSIS — K7460 Unspecified cirrhosis of liver: Secondary | ICD-10-CM | POA: Diagnosis present

## 2024-08-02 DIAGNOSIS — R918 Other nonspecific abnormal finding of lung field: Secondary | ICD-10-CM | POA: Diagnosis present

## 2024-08-02 DIAGNOSIS — J9621 Acute and chronic respiratory failure with hypoxia: Principal | ICD-10-CM | POA: Diagnosis present

## 2024-08-02 DIAGNOSIS — Z9012 Acquired absence of left breast and nipple: Secondary | ICD-10-CM

## 2024-08-02 DIAGNOSIS — I2699 Other pulmonary embolism without acute cor pulmonale: Secondary | ICD-10-CM | POA: Diagnosis present

## 2024-08-02 DIAGNOSIS — J90 Pleural effusion, not elsewhere classified: Secondary | ICD-10-CM | POA: Diagnosis present

## 2024-08-02 DIAGNOSIS — E876 Hypokalemia: Secondary | ICD-10-CM | POA: Diagnosis present

## 2024-08-02 DIAGNOSIS — Z79890 Hormone replacement therapy (postmenopausal): Secondary | ICD-10-CM

## 2024-08-02 DIAGNOSIS — I10 Essential (primary) hypertension: Secondary | ICD-10-CM | POA: Diagnosis present

## 2024-08-02 DIAGNOSIS — Z91040 Latex allergy status: Secondary | ICD-10-CM

## 2024-08-02 DIAGNOSIS — Z1152 Encounter for screening for COVID-19: Secondary | ICD-10-CM

## 2024-08-02 DIAGNOSIS — Z853 Personal history of malignant neoplasm of breast: Secondary | ICD-10-CM

## 2024-08-02 DIAGNOSIS — Z90710 Acquired absence of both cervix and uterus: Secondary | ICD-10-CM

## 2024-08-02 DIAGNOSIS — J449 Chronic obstructive pulmonary disease, unspecified: Secondary | ICD-10-CM | POA: Diagnosis present

## 2024-08-02 DIAGNOSIS — R600 Localized edema: Secondary | ICD-10-CM

## 2024-08-02 DIAGNOSIS — J9601 Acute respiratory failure with hypoxia: Secondary | ICD-10-CM | POA: Diagnosis not present

## 2024-08-02 DIAGNOSIS — Z888 Allergy status to other drugs, medicaments and biological substances status: Secondary | ICD-10-CM

## 2024-08-02 DIAGNOSIS — E039 Hypothyroidism, unspecified: Secondary | ICD-10-CM | POA: Diagnosis present

## 2024-08-02 DIAGNOSIS — Z79899 Other long term (current) drug therapy: Secondary | ICD-10-CM

## 2024-08-02 DIAGNOSIS — J440 Chronic obstructive pulmonary disease with acute lower respiratory infection: Secondary | ICD-10-CM | POA: Diagnosis present

## 2024-08-02 DIAGNOSIS — I700 Atherosclerosis of aorta: Secondary | ICD-10-CM | POA: Diagnosis present

## 2024-08-02 DIAGNOSIS — I2782 Chronic pulmonary embolism: Secondary | ICD-10-CM | POA: Diagnosis present

## 2024-08-02 DIAGNOSIS — R188 Other ascites: Secondary | ICD-10-CM | POA: Diagnosis present

## 2024-08-02 DIAGNOSIS — E785 Hyperlipidemia, unspecified: Secondary | ICD-10-CM | POA: Diagnosis present

## 2024-08-02 DIAGNOSIS — R9389 Abnormal findings on diagnostic imaging of other specified body structures: Secondary | ICD-10-CM

## 2024-08-02 DIAGNOSIS — Z87891 Personal history of nicotine dependence: Secondary | ICD-10-CM

## 2024-08-02 DIAGNOSIS — E871 Hypo-osmolality and hyponatremia: Secondary | ICD-10-CM | POA: Diagnosis present

## 2024-08-02 DIAGNOSIS — K5730 Diverticulosis of large intestine without perforation or abscess without bleeding: Secondary | ICD-10-CM | POA: Diagnosis present

## 2024-08-02 DIAGNOSIS — R0902 Hypoxemia: Principal | ICD-10-CM

## 2024-08-02 DIAGNOSIS — I509 Heart failure, unspecified: Secondary | ICD-10-CM | POA: Diagnosis present

## 2024-08-02 LAB — URINALYSIS, W/ REFLEX TO CULTURE (INFECTION SUSPECTED)
Bilirubin Urine: NEGATIVE
Glucose, UA: NEGATIVE mg/dL
Hgb urine dipstick: NEGATIVE
Ketones, ur: NEGATIVE mg/dL
Leukocytes,Ua: NEGATIVE
Nitrite: NEGATIVE
Protein, ur: NEGATIVE mg/dL
Specific Gravity, Urine: >1.046 — ABNORMAL HIGH (ref 1.005–1.030)
pH: 5 (ref 5.0–8.0)

## 2024-08-02 LAB — COMPREHENSIVE METABOLIC PANEL WITH GFR
ALT: 18 U/L (ref 0–44)
AST: 25 U/L (ref 15–41)
Albumin: 3 g/dL — ABNORMAL LOW (ref 3.5–5.0)
Alkaline Phosphatase: 189 U/L — ABNORMAL HIGH (ref 38–126)
Anion gap: 16 — ABNORMAL HIGH (ref 5–15)
BUN: 17 mg/dL (ref 8–23)
CO2: 28 mmol/L (ref 22–32)
Calcium: 9.1 mg/dL (ref 8.9–10.3)
Chloride: 88 mmol/L — ABNORMAL LOW (ref 98–111)
Creatinine, Ser: 0.79 mg/dL (ref 0.44–1.00)
GFR, Estimated: >60 mL/min (ref >60)
Glucose, Bld: 117 mg/dL — ABNORMAL HIGH (ref 70–99)
Potassium: 3.1 mmol/L — ABNORMAL LOW (ref 3.5–5.1)
Sodium: 132 mmol/L — ABNORMAL LOW (ref 135–145)
Total Bilirubin: 0.8 mg/dL (ref 0.0–1.2)
Total Protein: 6.5 g/dL (ref 6.5–8.1)

## 2024-08-02 LAB — I-STAT CHEM 8, ED
BUN: 20 mg/dL (ref 8–23)
Calcium, Ion: 1.12 mmol/L — ABNORMAL LOW (ref 1.15–1.40)
Chloride: 89 mmol/L — ABNORMAL LOW (ref 98–111)
Creatinine, Ser: 0.9 mg/dL (ref 0.44–1.00)
Glucose, Bld: 121 mg/dL — ABNORMAL HIGH (ref 70–99)
HCT: 46 % (ref 36.0–46.0)
Hemoglobin: 15.6 g/dL — ABNORMAL HIGH (ref 12.0–15.0)
Potassium: 3 mmol/L — ABNORMAL LOW (ref 3.5–5.1)
Sodium: 133 mmol/L — ABNORMAL LOW (ref 135–145)
TCO2: 28 mmol/L (ref 22–32)

## 2024-08-02 LAB — RESP PANEL BY RT-PCR (RSV, FLU A&B, COVID)  RVPGX2
Influenza A by PCR: NEGATIVE
Influenza B by PCR: NEGATIVE
Resp Syncytial Virus by PCR: NEGATIVE
SARS Coronavirus 2 by RT PCR: NEGATIVE

## 2024-08-02 LAB — PROTIME-INR
INR: 1 (ref 0.8–1.2)
Prothrombin Time: 14.1 s (ref 11.4–15.2)

## 2024-08-02 LAB — CBC WITH DIFFERENTIAL/PLATELET
Abs Immature Granulocytes: 0.12 K/uL — ABNORMAL HIGH (ref 0.00–0.07)
Basophils Absolute: 0.1 K/uL (ref 0.0–0.1)
Basophils Relative: 1 %
Eosinophils Absolute: 0.1 K/uL (ref 0.0–0.5)
Eosinophils Relative: 1 %
HCT: 43 % (ref 36.0–46.0)
Hemoglobin: 14.5 g/dL (ref 12.0–15.0)
Immature Granulocytes: 1 %
Lymphocytes Relative: 8 %
Lymphs Abs: 1.3 K/uL (ref 0.7–4.0)
MCH: 32.1 pg (ref 26.0–34.0)
MCHC: 33.7 g/dL (ref 30.0–36.0)
MCV: 95.1 fL (ref 80.0–100.0)
Monocytes Absolute: 1.3 K/uL — ABNORMAL HIGH (ref 0.1–1.0)
Monocytes Relative: 9 %
Neutro Abs: 12.6 K/uL — ABNORMAL HIGH (ref 1.7–7.7)
Neutrophils Relative %: 80 %
Platelets: 661 K/uL — ABNORMAL HIGH (ref 150–400)
RBC: 4.52 MIL/uL (ref 3.87–5.11)
RDW: 12.4 % (ref 11.5–15.5)
WBC: 15.5 K/uL — ABNORMAL HIGH (ref 4.0–10.5)
nRBC: 0 % (ref 0.0–0.2)

## 2024-08-02 LAB — LIPASE, BLOOD: Lipase: 485 U/L — ABNORMAL HIGH (ref 11–51)

## 2024-08-02 LAB — STREP PNEUMONIAE URINARY ANTIGEN: Strep Pneumo Urinary Antigen: NEGATIVE

## 2024-08-02 LAB — AMMONIA: Ammonia: 29 umol/L (ref 9–35)

## 2024-08-02 LAB — TROPONIN I (HIGH SENSITIVITY)
Troponin I (High Sensitivity): 13 ng/L (ref <18)
Troponin I (High Sensitivity): 15 ng/L (ref <18)

## 2024-08-02 LAB — I-STAT CG4 LACTIC ACID, ED
Lactic Acid, Venous: 1.5 mmol/L (ref 0.5–1.9)
Lactic Acid, Venous: 1.3 mmol/L (ref 0.5–1.9)

## 2024-08-02 LAB — BRAIN NATRIURETIC PEPTIDE: B Natriuretic Peptide: 48.8 pg/mL (ref 0.0–100.0)

## 2024-08-02 MED ORDER — POTASSIUM CHLORIDE CRYS ER 20 MEQ PO TBCR
40.0000 meq | EXTENDED_RELEASE_TABLET | Freq: Once | ORAL | Status: AC
  Administered 2024-08-02: 40 meq via ORAL
  Filled 2024-08-02: qty 2

## 2024-08-02 MED ORDER — IOHEXOL 350 MG/ML SOLN
75.0000 mL | Freq: Once | INTRAVENOUS | Status: AC | PRN
  Administered 2024-08-02: 75 mL via INTRAVENOUS

## 2024-08-02 MED ORDER — SODIUM CHLORIDE 0.9 % IV SOLN
1.0000 g | Freq: Once | INTRAVENOUS | Status: AC
  Administered 2024-08-02: 1 g via INTRAVENOUS
  Filled 2024-08-02: qty 10

## 2024-08-02 MED ORDER — FUROSEMIDE 20 MG PO TABS
20.0000 mg | ORAL_TABLET | Freq: Every day | ORAL | Status: DC
  Administered 2024-08-03: 20 mg via ORAL
  Filled 2024-08-02: qty 1

## 2024-08-02 MED ORDER — ACETAMINOPHEN 325 MG PO TABS
650.0000 mg | ORAL_TABLET | Freq: Four times a day (QID) | ORAL | Status: DC | PRN
  Administered 2024-08-03 – 2024-08-04 (×3): 650 mg via ORAL
  Filled 2024-08-02 (×3): qty 2

## 2024-08-02 MED ORDER — HEPARIN BOLUS VIA INFUSION
3500.0000 [IU] | Freq: Once | INTRAVENOUS | Status: AC
  Administered 2024-08-02: 3500 [IU] via INTRAVENOUS
  Filled 2024-08-02: qty 3500

## 2024-08-02 MED ORDER — ACETAMINOPHEN 650 MG RE SUPP: 650.0000 mg | Freq: Four times a day (QID) | RECTAL | Status: DC | PRN

## 2024-08-02 MED ORDER — SPIRONOLACTONE 25 MG PO TABS
50.0000 mg | ORAL_TABLET | Freq: Every day | ORAL | Status: DC
  Administered 2024-08-03 – 2024-08-07 (×5): 50 mg via ORAL
  Filled 2024-08-02 (×5): qty 2

## 2024-08-02 MED ORDER — CEFTRIAXONE SODIUM 1 G IJ SOLR
1.0000 g | INTRAMUSCULAR | Status: DC
  Administered 2024-08-03: 1 g via INTRAVENOUS
  Filled 2024-08-02: qty 10

## 2024-08-02 MED ORDER — SENNOSIDES-DOCUSATE SODIUM 8.6-50 MG PO TABS
1.0000 | ORAL_TABLET | Freq: Every evening | ORAL | Status: DC | PRN
  Administered 2024-08-02 – 2024-08-03 (×2): 1 via ORAL
  Filled 2024-08-02 (×2): qty 1

## 2024-08-02 MED ORDER — ONDANSETRON HCL 4 MG/2ML IJ SOLN: 4.0000 mg | Freq: Four times a day (QID) | INTRAMUSCULAR | Status: DC | PRN

## 2024-08-02 MED ORDER — SODIUM CHLORIDE 0.9 % IV SOLN
500.0000 mg | INTRAVENOUS | Status: AC
  Administered 2024-08-03 – 2024-08-04 (×2): 500 mg via INTRAVENOUS
  Filled 2024-08-02 (×2): qty 5

## 2024-08-02 MED ORDER — ONDANSETRON HCL 4 MG PO TABS: 4.0000 mg | ORAL_TABLET | Freq: Four times a day (QID) | ORAL | Status: DC | PRN

## 2024-08-02 MED ORDER — LEVOTHYROXINE SODIUM 100 MCG PO TABS
100.0000 ug | ORAL_TABLET | Freq: Every morning | ORAL | Status: DC
  Administered 2024-08-03 – 2024-08-06 (×4): 100 ug via ORAL
  Filled 2024-08-02 (×4): qty 1

## 2024-08-02 MED ORDER — SODIUM CHLORIDE 0.9% FLUSH
3.0000 mL | Freq: Two times a day (BID) | INTRAVENOUS | Status: DC
  Administered 2024-08-02 – 2024-08-06 (×9): 3 mL via INTRAVENOUS

## 2024-08-02 MED ORDER — HEPARIN (PORCINE) 25000 UT/250ML-% IV SOLN
1000.0000 [IU]/h | INTRAVENOUS | Status: DC
  Administered 2024-08-02: 1050 [IU]/h via INTRAVENOUS
  Administered 2024-08-03 – 2024-08-05 (×3): 1000 [IU]/h via INTRAVENOUS
  Filled 2024-08-02 (×3): qty 250

## 2024-08-02 MED ORDER — POTASSIUM CHLORIDE 20 MEQ PO PACK: 40.0000 meq | PACK | Freq: Once | ORAL | Status: DC

## 2024-08-02 MED ORDER — IPRATROPIUM-ALBUTEROL 0.5-2.5 (3) MG/3ML IN SOLN: 3.0000 mL | Freq: Four times a day (QID) | RESPIRATORY_TRACT | Status: DC | PRN

## 2024-08-02 MED ORDER — SODIUM CHLORIDE 0.9 % IV SOLN
500.0000 mg | Freq: Once | INTRAVENOUS | Status: AC
  Administered 2024-08-02: 500 mg via INTRAVENOUS
  Filled 2024-08-02: qty 5

## 2024-08-02 MED ORDER — BUDESON-GLYCOPYRROL-FORMOTEROL 160-9-4.8 MCG/ACT IN AERO
2.0000 | INHALATION_SPRAY | Freq: Two times a day (BID) | RESPIRATORY_TRACT | Status: DC
Start: 2024-08-02 — End: 2024-08-07
  Administered 2024-08-03 – 2024-08-07 (×10): 2 via RESPIRATORY_TRACT
  Filled 2024-08-02: qty 5.9

## 2024-08-02 NOTE — ED Triage Notes (Signed)
 PT BIB [facility] from home after new onset of pitting edema since beginning of the week, PT recently stopped taking diuretics. PT also presents with distended and taught abdomen. PT wears 3L O2 at home baseline. Endorses SOB. L arm restriction from mastectomy. Aox4.   EMS VS: 130/80, HR 96, Spo2 98% on 4L [place], RR 26

## 2024-08-02 NOTE — H&P (Signed)
 History and Physical    Kara Newman FMW:[id number] DOB: May 07, 1946 DOA: 08/02/2024  PCP: Elliot Charm  Patient coming from: Home  I have personally briefly reviewed patient's old medical records in [facility]  Chief Complaint: Shortness of breath, abdominal swelling  HPI: Kara Newman is a 78 y.o. female with medical history significant for COPD, left breast cancer s/p mastectomy, HTN, HLD, hypothyroidism who presented to the ED for evaluation of shortness of breath, abdominal and lower extremity swelling.  Patient states that about 3 weeks ago she developed progressive shortness of breath.  Worse with exertion and when lying flat.  Since then she has had new supplemental O2 requirement of 3 L via [place].  She also has noticed increased swelling of her abdomen during this time.  About 4 days ago she noticed swelling in both of her legs.  She denies significant cough.  She was seen in the pulmonology clinic on 07/24/2024.  There was concern for volume overload due to possible congestive heart failure.  She was started on Lasix  20 mg for 5-day course.  Echocardiogram was ordered and performed on 07/30/2024.  It showed EF 60-65%, no LV RWMA, G1DD, normal RV systolic function, moderately elevated PA systolic pressure.  Patient states that she took the diuretic as prescribed but did not notice any significant change in her dyspnea.  She has not had any chest pain, nausea, vomiting, abdominal pain.  ED Course  Labs/Imaging on admission: I have personally reviewed following labs and imaging studies.  Initial vitals showed BP 151/86, pulse 94, RR 23, temp 97.8 F, SpO2 93% on 3 L O2 via [place].  Labs showed sodium 132, potassium 3.1, bicarb 28, BUN 17, creatinine 0.79, serum glucose 117, AST 25, ALT 18, alk phos 189, total bilirubin 0.8, lactic acid 1.5, BNP 48.8, WBC 15.5, hemoglobin 14.5, platelets 661, troponin 13.  SARS-CoV-2, influenza, RSV PCR negative.  Blood cultures ordered  and pending.  Portable chest x-ray showed increased interstitial markings overlying bilateral lower lung zones with new bilateral small pleural effusions.  Retrocardiac opacities partially obscuring the left medial hemidiaphragm.  CT abdomen/pelvis with contrast showed extensive peripheral nodular airspace disease throughout both lower lungs with small bilateral pleural effusions which could reflect infectious or neoplastic process.  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  Large volume ascites.  Abnormal infiltrative/nodular appearance of the omentum and the anterior abdomen, cannot exclude peritoneal malignancy/carcinomatosis.  Left colonic diverticulosis without diverticulitis.  CTA chest showed linear segmental PE in the left lung, favored to be chronic.  No evidence of right heart strain.  Multifocal patchy opacities in the lungs bilaterally, some of which may reflect infection/pneumonia although the appearance is concerning for underlying multifocal tumor.  Small bilateral pleural effusions, right greater than left also noted.  Patient was given IV ceftriaxone  and azithromycin .  The hospitalist service was consulted for admission.  Review of Systems: All systems reviewed and are negative except as documented in history of present illness above.   Past Medical History:  Diagnosis Date   Breast cancer (HCC)    had left mastectomy.    COPD (chronic obstructive pulmonary disease) (HCC)    Endometriosis     Past Surgical History:  Procedure Laterality Date   ABDOMINAL HYSTERECTOMY     MASTECTOMY      Social History: Social History   Tobacco Use   Smoking status: Former    Current packs/day: 0.00    Average packs/day: 1 pack/day for 38.0 years (38.0  ttl pk-yrs)    Types: Cigarettes    Start date: 11/06/1962    Quit date: 11/06/2000    Years since quitting: 23.7   Smokeless tobacco: Never  Vaping Use   Vaping status: Never Used  Substance Use Topics   Alcohol  use: Yes   Drug use: Never   Allergies  Allergen Reactions   Ace Inhibitors     Other reaction(s): Cough (ALLERGY/intolerance)   Benzalkonium Chloride Other (See Comments)    Other reaction(s): Other (See Comments), Other (See Comments) Caused an infection Caused an infection    Levothyroxine  Sodium Other (See Comments)    Other reaction(s): troubles in the past, wants brand Synthroid    Latex Rash   Sulfa Antibiotics Other (See Comments)    Patient cannot recall if she has an allergy.  She does not take this medication due to it being ineffective.     Family History  Problem Relation Age of Onset   Lung cancer Mother      Prior to Admission medications   Medication Sig Start Date End Date Taking? Authorizing Provider  albuterol  (PROVENTIL ) (2.5 MG/3ML) 0.083% nebulizer solution Take 3 mLs (2.5 mg total) by nebulization every 6 (six) hours as needed. 06/05/24   Cobb, Comer GAILS, NP  albuterol  (VENTOLIN  HFA) 108 (90 Base) MCG/ACT inhaler Inhale 2 puffs into the lungs every 4 (four) hours as needed for wheezing or shortness of breath. 04/24/24   Desai, Nikita S, MD  amLODipine (NORVASC) 5 MG tablet Take 5 mg by mouth daily.    [provider]  atorvastatin (LIPITOR) 20 MG tablet Take 20 mg by mouth daily. 01/21/20   [provider]  azithromycin  (ZITHROMAX ) 250 MG tablet Take 500 mg on day 1 and then 250 mg/day for the next 4 days 06/24/24   Mannam, Praveen, MD  benzonatate  (TESSALON ) 200 MG capsule Take 1 capsule (200 mg total) by mouth 3 (three) times daily as needed for cough. 06/05/24   Cobb, Comer GAILS, NP  budesonide -glycopyrrolate -formoterol  (BREZTRI  AEROSPHERE) 160-9-4.8 MCG/ACT AERO inhaler Inhale 2 puffs into the lungs in the morning and at bedtime. 04/24/24   Desai, Nikita S, MD  chlorthalidone (HYGROTON) 25 MG tablet TK 1 T PO QAM 04/01/19   [provider]  doxycycline  (VIBRA -TABS) 100 MG tablet Take 1 tablet (100 mg total) by mouth 2 (two) times  daily. Patient not taking: Reported on 07/24/2024 06/05/24   Malachy Comer GAILS, NP  furosemide  (LASIX ) 40 MG tablet Take 1 tablet (40 mg total) by mouth daily. 07/24/24   Cobb, Comer GAILS, NP  losartan (COZAAR) 25 MG tablet Take 25 mg by mouth daily. 08/14/22   [provider]  meclizine (ANTIVERT) 25 MG tablet Take 25 mg by mouth 2 (two) times daily as needed. 06/19/23   [provider]  potassium chloride  (KLOR-CON  M) 10 MEQ tablet Take 10 mEq by mouth once. 06/20/23   [provider]  predniSONE  (DELTASONE ) 10 MG tablet Take 6 tablets (60 mg total) by mouth daily with breakfast. Take 60 mg/day on day 1 and reduce dose by 10 mg every 2 days until taper is complete 06/24/24   Mannam, Praveen, MD  promethazine -dextromethorphan (PROMETHAZINE -DM) 6.25-15 MG/5ML syrup Take 5 mLs by mouth 4 (four) times daily as needed for cough. 07/04/24   Meade Verdon RAMAN, MD  SYNTHROID  112 MCG tablet Take 112 mcg by mouth daily. 12/26/21   [provider]    Physical Exam: Vitals:   08/02/24 1900 08/02/24 1915 08/02/24  1930 08/02/24 2017  BP: (!) 143/69 (!) 141/74 139/68 (!) 150/74  Pulse: 86 82 80 87  Resp:   20 18  Temp:    98.8 F (37.1 C)  TempSrc:    Oral  SpO2: 100% 100% 100% 98%  Weight:    61.2 kg  Height:    5' 5 (1.651 m)   Constitutional: Resting in bed with head elevated, NAD, calm, comfortable Eyes: EOMI, lids and conjunctivae normal ENMT: Mucous membranes are moist. Posterior pharynx clear of any exudate or lesions.Normal dentition.  Neck: normal, supple, no masses. Respiratory: Fine inspiratory crackles bilaterally. Normal respiratory effort while on 3 L O2 [place]. No accessory muscle use.  Cardiovascular: Regular rate and rhythm, no murmurs / rubs / gallops. No significant lower extremity edema. 2+ pedal pulses. Abdomen: Distended and taut abdomen, no tenderness Musculoskeletal: no clubbing / cyanosis. No joint deformity upper and lower extremities. Good ROM, no  contractures. Normal muscle tone.  Skin: no rashes, lesions, ulcers. No induration Neurologic: Sensation intact. Strength 5/5 in all 4.  Psychiatric: Normal judgment and insight. Alert and oriented x 3. Normal mood.   EKG: Personally reviewed. Sinus rhythm, rate 93, motion artifact.  Assessment/Plan Principal Problem:   Acute respiratory failure with hypoxia (HCC) Active Problems:   Chronic obstructive pulmonary disease (HCC)   Essential hypertension   Hypothyroidism   Pulmonary embolism (HCC)   Ascites   Kara Newman is a 78 y.o. female with medical history significant for COPD, left breast cancer s/p mastectomy, HTN, HLD, hypothyroidism who is admitted with acute hypoxic respiratory failure, large volume ascites, imaging findings concerning for malignant process.  Assessment and Plan: Acute respiratory failure with hypoxia Peripheral nodular airspace disease Small bilateral pleural effusions Large volume ascites: Patient with new 3 L supplemental O2 requirement beginning about 3 weeks ago.  Chronic appearing PE seen on CTA chest.  Imaging also noted from multifocal patchy opacities bilaterally, could be infectious although appearance concerning for underlying multifocal tumor.  CT A/P also shows large volume ascites and abnormal infiltrative/nodular appearance of omentum and anterior abdomen, cannot exclude peritoneal malignancy/carcinomatosis. - Order for paracentesis with labs placed - Continue empiric antibiotics for now, IV ceftriaxone  and azithromycin  - Continue supplemental oxygen as needed  Cirrhosis: CT A/P shows nodular and shrunken appearing liver suggestive of cirrhosis.  Given large volume ascites, will start on diuretic regiment. - Start spironolactone  50 mg daily and Lasix  20 mg daily; titrate as able - Paracentesis as above  Pulmonary embolism: CTA chest shows linear segmental PE in the left lung, favored to be chronic.  No evidence of right heart strain.  TTE  9/17 showed moderatelt elevated systolic PA pressure, EF 60-65%, no RWMA, normal RV systolic function. - Start IV heparin  and transition to DOAC when able/if no further procedures planned  COPD: Stable, no sign of acute exacerbation at time of admission.  Continue Breztri , DuoNebs as needed.  Hypertension: Starting spironolactone  and Lasix  as above.  Will hold amlodipine, chlorthalidone and losartan for now.  Hypothyroidism: Continue Synthroid .  Hypokalemia: Supplementing.  Hyponatremia: Mild in the setting of volume overload.  Continue to monitor.   DVT prophylaxis: IV heparin  Code Status: Full code, confirmed with patient on admission Family Communication: Discussed with patient, she has discussed with family Disposition Plan: From home, dispo pending clinical progress Consults called: None Severity of Illness: The appropriate patient status for this patient is OBSERVATION. Observation status is judged to be reasonable and necessary in order to provide the required  intensity of service to ensure the patient's safety. The patient's presenting symptoms, physical exam findings, and initial radiographic and laboratory data in the context of their medical condition is felt to place them at decreased risk for further clinical deterioration. Furthermore, it is anticipated that the patient will be medically stable for discharge from the hospital within 2 midnights of admission.   Jorie Blanch MD [facility]  If 7PM-7AM, please contact night-coverage [email or web address]  08/02/2024, 9:50 PM

## 2024-08-02 NOTE — Progress Notes (Signed)
 PHARMACY - ANTICOAGULATION CONSULT NOTE  Pharmacy Consult for heparin  Indication: pulmonary embolus  Allergies  Allergen Reactions   Ace Inhibitors     Other reaction(s): Cough (ALLERGY/intolerance)   Benzalkonium Chloride     Other reaction(s): Other (See Comments), Other (See Comments) Caused an infection Caused an infection    Levothyroxine  Sodium     Other reaction(s): troubles in the past, wants brand Synthroid    Latex Rash    Patient Measurements: Height: 5' 5 (165.1 cm) Weight: 61.2 kg (134 lb 14.7 oz) IBW/kg (Calculated) : 57 HEPARIN  DW (KG): 61.2  Vital Signs: Temp: 98.8 F (37.1 C) (09/20 2017) Temp Source: Oral (09/20 2017) BP: 150/74 (09/20 2017) Pulse Rate: 87 (09/20 2017)  Labs: Recent Labs    07/31/24 1601 08/02/24 1601 08/02/24 1608 08/02/24 1916  HGB  --  14.5 15.6*  --   HCT  --  43.0 46.0  --   PLT  --  661*  --   --   LABPROT  --   --   --  14.1  INR  --   --   --  1.0  CREATININE 0.86 0.79 0.90  --   TROPONINIHS  --  13  --  15    Estimated Creatinine Clearance: 44.9 mL/min (by C-G formula based on SCr of 0.9 mg/dL).   Medical History: Past Medical History:  Diagnosis Date   Breast cancer (HCC)    had left mastectomy.    COPD (chronic obstructive pulmonary disease) (HCC)    Endometriosis      Assessment: 42 YOF presenting with lower extremity swelling, SOB, CT angio chest shows PE without RHS, she is not on anticoagulation PTA  Goal of Therapy:  Heparin  level 0.3-0.7 units/ml Monitor platelets by anticoagulation protocol: Yes   Plan:  Heparin  3500 units IV x 1, and gtt at 1050 units/hr F/u 8 hour heparin  level F/u long term AC plan  Dorn Poot, PharmD, [facility] Clinical Pharmacist ED Pharmacist Phone # [phone] 08/02/2024 8:54 PM

## 2024-08-02 NOTE — ED Provider Notes (Signed)
 [place] EMERGENCY DEPARTMENT AT [facility] Provider Note   CSN: [id number] Arrival date & time: 08/02/24  1452     Patient presents with: Shortness of Breath   Kara Newman is a 78 y.o. female.   The history is provided by the patient and medical records. No language interpreter was used.  Shortness of Breath Severity:  Severe Onset quality:  Gradual Duration:  5 days Timing:  Constant Progression:  Worsening Chronicity:  New Context: URI   Relieved by:  Oxygen Worsened by:  Exertion and coughing Ineffective treatments:  None tried Associated symptoms: abdominal pain, cough, sputum production and vomiting   Associated symptoms: no chest pain, no fever, no headaches, no hemoptysis, no neck pain, no rash and no wheezing   Risk factors: hx of cancer   Risk factors: no hx of PE/DVT        Prior to Admission medications   Medication Sig Start Date End Date Taking? Authorizing Provider  albuterol  (PROVENTIL ) (2.5 MG/3ML) 0.083% nebulizer solution Take 3 mLs (2.5 mg total) by nebulization every 6 (six) hours as needed. 06/05/24   Cobb, Comer GAILS, NP  albuterol  (VENTOLIN  HFA) 108 (90 Base) MCG/ACT inhaler Inhale 2 puffs into the lungs every 4 (four) hours as needed for wheezing or shortness of breath. 04/24/24   Desai, Nikita S, MD  amLODipine (NORVASC) 5 MG tablet Take 5 mg by mouth daily.    [provider]  atorvastatin (LIPITOR) 20 MG tablet Take 20 mg by mouth daily. 01/21/20   [provider]  azithromycin  (ZITHROMAX ) 250 MG tablet Take 500 mg on day 1 and then 250 mg/day for the next 4 days 06/24/24   Mannam, Praveen, MD  benzonatate  (TESSALON ) 200 MG capsule Take 1 capsule (200 mg total) by mouth 3 (three) times daily as needed for cough. 06/05/24   Cobb, Comer GAILS, NP  budesonide -glycopyrrolate -formoterol  (BREZTRI  AEROSPHERE) 160-9-4.8 MCG/ACT AERO inhaler Inhale 2 puffs into the lungs in the morning and at bedtime. 04/24/24   Desai, Nikita  S, MD  chlorthalidone (HYGROTON) 25 MG tablet TK 1 T PO QAM 04/01/19   [provider]  doxycycline  (VIBRA -TABS) 100 MG tablet Take 1 tablet (100 mg total) by mouth 2 (two) times daily. Patient not taking: Reported on 07/24/2024 06/05/24   Malachy Comer GAILS, NP  furosemide  (LASIX ) 40 MG tablet Take 1 tablet (40 mg total) by mouth daily. 07/24/24   Cobb, Comer GAILS, NP  losartan (COZAAR) 25 MG tablet Take 25 mg by mouth daily. 08/14/22   [provider]  meclizine (ANTIVERT) 25 MG tablet Take 25 mg by mouth 2 (two) times daily as needed. 06/19/23   [provider]  potassium chloride  (KLOR-CON  M) 10 MEQ tablet Take 10 mEq by mouth once. 06/20/23   [provider]  predniSONE  (DELTASONE ) 10 MG tablet Take 6 tablets (60 mg total) by mouth daily with breakfast. Take 60 mg/day on day 1 and reduce dose by 10 mg every 2 days until taper is complete 06/24/24   Mannam, Praveen, MD  promethazine -dextromethorphan (PROMETHAZINE -DM) 6.25-15 MG/5ML syrup Take 5 mLs by mouth 4 (four) times daily as needed for cough. 07/04/24   Desai, Nikita S, MD  SYNTHROID  112 MCG tablet Take 112 mcg by mouth daily. 12/26/21   [provider]    Allergies: Ace inhibitors, Benzalkonium chloride, Levothyroxine  sodium, and Latex    Review of Systems  Constitutional:  Positive for fatigue. Negative for chills and fever.  HENT:  Negative  for congestion.   Respiratory:  Positive for cough, sputum production, chest tightness and shortness of breath. Negative for hemoptysis, wheezing and stridor.   Cardiovascular:  Positive for leg swelling. Negative for chest pain and palpitations.  Gastrointestinal:  Positive for abdominal distention, abdominal pain, constipation, nausea and vomiting. Negative for diarrhea.  Genitourinary:  Positive for decreased urine volume. Negative for dysuria and flank pain.  Musculoskeletal:  Negative for back pain, neck pain and neck stiffness.  Skin:  Negative for rash  and wound.  Neurological:  Negative for light-headedness and headaches.  Psychiatric/Behavioral:  Negative for agitation.   All other systems reviewed and are negative.   Updated Vital Signs BP (!) 151/86   Pulse 93   Temp 97.8 F (36.6 C)   Resp (!) 23   Ht 5' 5 (1.651 m)   Wt 61.2 kg   SpO2 93%   BMI 22.47 kg/m   Physical Exam Vitals and nursing note reviewed.  Constitutional:      General: She is not in acute distress.    Appearance: She is well-developed. She is not ill-appearing, toxic-appearing or diaphoretic.  HENT:     Head: Normocephalic and atraumatic.     Mouth/Throat:     Mouth: Mucous membranes are moist.  Eyes:     Conjunctiva/sclera: Conjunctivae normal.  Cardiovascular:     Rate and Rhythm: Normal rate and regular rhythm.     Heart sounds: No murmur heard. Pulmonary:     Effort: Tachypnea present. No respiratory distress.     Breath sounds: Rales present.  Chest:     Chest wall: No tenderness.  Abdominal:     Palpations: Abdomen is soft.     Tenderness: There is no abdominal tenderness.  Musculoskeletal:        General: No swelling.     Cervical back: Neck supple.     Right lower leg: Edema present.     Left lower leg: Edema present.  Skin:    General: Skin is warm and dry.     Capillary Refill: Capillary refill takes less than 2 seconds.     Findings: No erythema.  Neurological:     General: No focal deficit present.     Mental Status: She is alert.  Psychiatric:        Mood and Affect: Mood normal.     (all labs ordered are listed, but only abnormal results are displayed) Labs Reviewed  CBC WITH DIFFERENTIAL/PLATELET - Abnormal; Notable for the following components:      Result Value   WBC 15.5 (*)    Platelets 661 (*)    Neutro Abs 12.6 (*)    Monocytes Absolute 1.3 (*)    Abs Immature Granulocytes 0.12 (*)    All other components within normal limits  COMPREHENSIVE METABOLIC PANEL WITH GFR - Abnormal; Notable for the following  components:   Sodium 132 (*)    Potassium 3.1 (*)    Chloride 88 (*)    Glucose, Bld 117 (*)    Albumin 3.0 (*)    Alkaline Phosphatase 189 (*)    Anion gap 16 (*)    All other components within normal limits  LIPASE, BLOOD - Abnormal; Notable for the following components:   Lipase 485 (*)    All other components within normal limits  URINALYSIS, W/ REFLEX TO CULTURE (INFECTION SUSPECTED) - Abnormal; Notable for the following components:   Specific Gravity, Urine >1.046 (*)    Bacteria, UA RARE (*)  All other components within normal limits  I-STAT CHEM 8, ED - Abnormal; Notable for the following components:   Sodium 133 (*)    Potassium 3.0 (*)    Chloride 89 (*)    Glucose, Bld 121 (*)    Calcium, Ion 1.12 (*)    Hemoglobin 15.6 (*)    All other components within normal limits  RESP PANEL BY RT-PCR (RSV, FLU A&B, COVID)  [id number]  CULTURE, BLOOD (ROUTINE X 2)  CULTURE, BLOOD (ROUTINE X 2)  BRAIN NATRIURETIC PEPTIDE  AMMONIA  PROTIME-INR  STREP PNEUMONIAE URINARY ANTIGEN  TSH  HEPARIN  LEVEL (UNFRACTIONATED)  CBC  LEGIONELLA PNEUMOPHILA SEROGP 1 UR AG  MAGNESIUM  COMPREHENSIVE METABOLIC PANEL WITH GFR  I-STAT CG4 LACTIC ACID, ED  I-STAT CG4 LACTIC ACID, ED  TROPONIN I (HIGH SENSITIVITY)  TROPONIN I (HIGH SENSITIVITY)    EKG: EKG Interpretation Date/Time:  Saturday August 02 2024 15:02:19 EDT Ventricular Rate:  93 PR Interval:  156 QRS Duration:  101 QT Interval:  431 QTC Calculation: 537 R Axis:   99  Text Interpretation: Sinus rhythm Anterior infarct, age indeterminate Prolonged QT interval no prior ECG for comparison no STEMI wandering baseline and some artifact present Confirmed by Ginger Barefoot ([id number]) on 08/02/2024 3:34:22 PM  Radiology: CT Angio Chest PE W and/or Wo Contrast Addendum Date: 08/02/2024 ** ADDENDUM #1 ** ADDENDUM: Critical Value/emergent results were called by telephone at the time of interpretation on 08/02/2024 at 2036 hrs to provider  Dr Ginger. ---------------------------------------------------- Electronically signed by: Pinkie Pebbles MD 08/02/2024 08:49 PM EDT RP Workstation: [id number]   Result Date: 08/02/2024 ** ORIGINAL REPORT ** EXAM: CTA of the Chest with contrast for PE 08/02/2024 08:10:00 PM TECHNIQUE: CTA of the chest was performed after the administration of intravenous contrast. Multiplanar reformatted images are provided for review. MIP images are provided for review. Automated exposure control, iterative reconstruction, and/or weight based adjustment of the mA/kV was utilized to reduce the radiation dose to as low as reasonably achievable. COMPARISON: Chest radiograph earlier today. CLINICAL HISTORY: Pulmonary embolism (PE) suspected, high prob. Chief complaints; Shortness of Breath; CT Angio Chest PE W and/or Wo Contrast; Pulmonary embolism (PE) suspected, high prob. FINDINGS: PULMONARY ARTERIES: Peripheral linear filling defect at the bifurcation of the distal left main pulmonary artery (image 61) extending into the segmental lingular pulmonary artery (image 56) as well as the anterior segment left lower lobe (image 85) suggesting mild segmental pulmonary embolism, although possibly chronic given the appearance. MEDIASTINUM: No evidence of right heart strain. Thoracic aortic atherosclerosis. Mild coronary atherosclerosis of the LAD. LYMPH NODES: Small mediastinal lymph nodes which do not need pathologic CT size criteria. LUNGS AND PLEURA: Small bilateral pleural effusions, right greater than left. Multifocal patchy/nodular opacities in the lungs bilaterally, including a 2.4 x 2.0 cm nodular opacity in the anteromedial left upper lobe (image 42) and a 4.3 x 3.2 cm mass like opacity in the inferomedial right upper lobe (image 89). This appearance raises concern for multifocal tumor, less likely multifocal infection/pneumonia. Very mild interlobular septal thickening in the upper lobes but this appearance is not favored to  reflect interstitial edema. UPPER ABDOMEN: Upper abdomen is better evaluated on recent CT abdomen/pelvis. 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 Mild degenerative changes of the mid thoracic spine. IMPRESSION: 1. 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 No evidence of right heart strain. 2. Multifocal patchy opacities in the lungs bilaterally, some of  which may reflect infection/pneumonia, although the appearance is concerning for underlying multifocal tumor. Follow-up CT chest is suggested in 3-4 weeks after appropriate antimicrobial therapy. 3. Small bilateral pleural effusions, right greater than left. Electronically signed by: Pinkie Pebbles MD 08/02/2024 08:40 PM EDT RP Workstation: [id number]   CT ABDOMEN PELVIS W CONTRAST Result Date: 08/02/2024 CLINICAL DATA:  Bowel obstruction suspected. Distended abdomen. Lower extremity swelling. EXAM: CT ABDOMEN AND PELVIS WITH CONTRAST TECHNIQUE: Multidetector CT imaging of the abdomen and pelvis was performed using the standard protocol following bolus administration of intravenous contrast. RADIATION DOSE REDUCTION: This exam was performed according to the departmental dose-optimization program which includes automated exposure control, adjustment of the mA and/or kV according to patient size and/or use of iterative reconstruction technique. CONTRAST:  75mL OMNIPAQUE  IOHEXOL  350 MG/ML SOLN COMPARISON:  None Available. FINDINGS: Lower chest: Extensive nodular appearing airspace disease peripherally in both lower lungs. Small bilateral pleural effusions. Heart is normal size. Hepatobiliary: Liver appears shrunken with suggestion of nodular surface suggesting cirrhosis. Recommend clinical correlation. Gallbladder unremarkable. No biliary ductal dilatation. Pancreas: Extensive cystic changes throughout the pancreatic body and tail. No surrounding inflammation or ductal  dilatation. Spleen: No focal abnormality.  Normal size. Adrenals/Urinary Tract: No suspicious renal or adrenal abnormality. Small scattered bilateral renal cysts. No stones or hydronephrosis. Urinary bladder unremarkable. Stomach/Bowel: Left colonic diverticulosis. No active diverticulitis. Stomach and small bowel decompressed. Vascular/Lymphatic: Aortic atherosclerosis. No evidence of aneurysm or adenopathy. Reproductive: Uterus appears very small.  No adnexal mass. Other: Large volume ascites in the abdomen and pelvis. There appears to be abnormal infiltrated/nodular appearance of the omentum anteriorly. Musculoskeletal: No acute bony abnormality. IMPRESSION: Extensive peripheral nodular airspace disease throughout both lower lungs with small bilateral pleural effusions. This could reflect infectious or neoplastic process. Insert further evaluation with full dedicated chest CT with IV contrast. 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 Large volume ascites. Abnormal infiltrated/nodular appearance of the omentum in the anterior abdomen. Cannot exclude peritoneal malignancy/carcinomatosis. Left colonic diverticulosis.  No active diverticulitis. Aortic atherosclerosis. Electronically Signed   By: Franky Crease M.D.   On: 08/02/2024 18:15   DG Chest Portable 1 View Result Date: 08/02/2024 CLINICAL DATA:  SOB, rales, edema. EXAM: PORTABLE CHEST 1 VIEW COMPARISON:  06/05/2024. FINDINGS: Bilateral lungs appear hyperlucent with coarse bronchovascular markings, in keeping with COPD. There are diffuse increased interstitial markings overlying the bilateral lower lung zones with associated new bilateral small pleural effusions, favoring congestive heart failure/pulmonary edema. There are retrocardiac opacities partially obscuring the left medial hemidiaphragm, which may represent left lung atelectasis and/or consolidation. Correlate clinically. Stable cardio-mediastinal silhouette. No acute osseous  abnormalities. The soft tissues are within normal limits. IMPRESSION: 1. Findings favor congestive heart failure/pulmonary edema. 2. There are retrocardiac opacities partially obscuring the left medial hemidiaphragm, which may represent left lung atelectasis and/or consolidation. Electronically Signed   By: Ree Molt M.D.   On: 08/02/2024 16:44     Procedures   CRITICAL CARE Performed by: Lonni PARAS Chea Malan Total critical care time: 35 minutes Critical care time was exclusive of separately billable procedures and treating other patients. Critical care was necessary to treat or prevent imminent or life-threatening deterioration. Critical care was time spent personally by me on the following activities: development of treatment plan with patient and/or surrogate as well as nursing, discussions with consultants, evaluation of patient's response to treatment, examination of patient, obtaining history from patient or surrogate, ordering and performing treatments and interventions, ordering and review of laboratory studies, ordering and  review of radiographic studies, pulse oximetry and re-evaluation of patient's condition.   Medications Ordered in the ED  heparin  ADULT infusion 100 units/mL (25000 units/250mL) (1,050 Units/hr Intravenous New Bag/Given 08/02/24 2255)  sodium chloride  flush (NS) 0.9 % injection 3 mL (3 mLs Intravenous Given 08/02/24 2309)  acetaminophen  (TYLENOL ) tablet 650 mg (has no administration in time range)    Or  acetaminophen  (TYLENOL ) suppository 650 mg (has no administration in time range)  ondansetron  (ZOFRAN ) tablet 4 mg (has no administration in time range)    Or  ondansetron  (ZOFRAN ) injection 4 mg (has no administration in time range)  senna-docusate (Senokot-S) tablet 1 tablet (1 tablet Oral Given 08/02/24 2323)  ipratropium-albuterol  (DUONEB) 0.5-2.5 (3) MG/3ML nebulizer solution 3 mL (has no administration in time range)  azithromycin  (ZITHROMAX ) 500 mg in  sodium chloride  0.9 % 250 mL IVPB (has no administration in time range)  cefTRIAXone  (ROCEPHIN ) 1 g in sodium chloride  0.9 % 100 mL IVPB (has no administration in time range)  furosemide  (LASIX ) tablet 20 mg (has no administration in time range)  levothyroxine  (SYNTHROID ) tablet 100 mcg (has no administration in time range)  budesonide -glycopyrrolate -formoterol  (BREZTRI ) 160-9-4.8 MCG/ACT inhaler 2 puff (has no administration in time range)  spironolactone  (ALDACTONE ) tablet 50 mg (has no administration in time range)  iohexol  (OMNIPAQUE ) 350 MG/ML injection 75 mL (75 mLs Intravenous Contrast Given 08/02/24 1728)  iohexol  (OMNIPAQUE ) 350 MG/ML injection 75 mL (75 mLs Intravenous Contrast Given 08/02/24 2010)  cefTRIAXone  (ROCEPHIN ) 1 g in sodium chloride  0.9 % 100 mL IVPB (0 g Intravenous Stopped 08/02/24 2100)  azithromycin  (ZITHROMAX ) 500 mg in sodium chloride  0.9 % 250 mL IVPB (0 mg Intravenous Stopped 08/02/24 2245)  heparin  bolus via infusion 3,500 Units (3,500 Units Intravenous Bolus from Bag 08/02/24 2255)  potassium chloride  SA (KLOR-CON  M) CR tablet 40 mEq (40 mEq Oral Given 08/02/24 2317)                                    Medical Decision Making Amount and/or Complexity of Data Reviewed Labs: ordered. Radiology: ordered.  Risk Prescription drug management. Decision regarding hospitalization.    Kara Newman is a 78 y.o. female with a past medical history significant for previous diverticulitis, COPD, hypertension, hypothyroidism, previous hysterectomy, and previous breast cancer with mastectomy who presents for worsening fatigue, shortness of breath, cough, peripheral edema, abdominal distention, nausea, vomiting, absent flatus, and decreased bowel movement.  According to patient, she has never needed oxygen until about 3 weeks ago and now is getting more more short of breath.  She reports that she did not take her Lasix  for the last 5 days and could not give me a clear reason as  to why she stopped taking it.  She says that she has not been eating or drinking as much and is feeling more fatigued.  She reports that she has not had a bowel movement in over a week and is not passing gas.  She reports her abdomen is distended and does have some pain at times.  She said her urine is decreased.  She reports new edema in her legs she is never had before in her abdomen.  She reports she cannot lay flat and is very short of breath.  She denies chest pain or palpitations and denies any pleuritic discomfort.  No reported history of blood clots.  Denies any trauma.  She denies any fevers, chills,  or congestion but does have this cough and short of breath.  She has had to increase her oxygen to 4 L when initially she was only on 2 and this is only new in the last few weeks.  On exam, lungs have rales diffusely and are diminished.  There is no significant wheezing or rhonchi.  Chest is nontender.  She does not have a murmur on my exam.  Abdomen is mildly tender diffusely and I did not hear bowel sounds.  Back and flanks nontender.  She has edema in both legs but did feel pulses.  Intact sensation and strength.  Symmetric smile.  Clear speech.  EKG showed no STEMI.  Given the patient's worsening oxygen requirement, significant new peripheral edema, fatigue, and increased work of breathing anticipate she will need admission.  I suspect she likely is fluid overload however given abdominal distention and pain she will need a CT scan to rule out bowel obstruction.  Will get chest x-ray is not concerned about edema in her lungs.  With lack of any chest pain or pleuritic discomfort have low suspicion for a thromboembolic etiology at this time.  With her decrease in urine we will get urinalysis and will check other labs.  Check BNP given the edema and shortness of breath.  Anticipate admission after workup  completed.  6:52 PM Workup continues to return.  Patient's workup does reveal concern for  pneumonia.  Given the worse nausea requirement, leukocytosis, x-ray and CT showing possible pneumonia, she will get antibiotics and be admitted.  Imaging also unfortunately shows ascites and possible malignancy.  Patient may end up needing other imaging to further investigate but we will call to admit her.  Her BNP surprisingly was normal and her troponin was normal as well.  Patient and family agree with plan for admission and further workup of the abnormalities discovered today.  Will defer further imaging such as PE study or CT chest with contrast to admitting team.  Given her lack of significant abdominal tenderness, I have less suspicion for SBP at this time.      Final diagnoses:  Hypoxia  SOB (shortness of breath)  Pneumonia due to infectious organism, unspecified laterality, unspecified part of lung  Other ascites  Abnormal CT scan     Clinical Impression: 1. Hypoxia   2. SOB (shortness of breath)   3. Pneumonia due to infectious organism, unspecified laterality, unspecified part of lung   4. Other ascites   5. Abnormal CT scan     Disposition: Admit  This note was prepared with assistance of Dragon voice recognition software. Occasional wrong-word or sound-a-like substitutions may have occurred due to the inherent limitations of voice recognition software.     Annamarie Yamaguchi, Lonni PARAS, MD 08/02/24 [phone]

## 2024-08-02 NOTE — Hospital Course (Signed)
 Kara Newman is a 78 y.o. female with medical history significant for COPD, left breast cancer s/p mastectomy, HTN, HLD, hypothyroidism who is admitted with acute hypoxic respiratory failure, large volume ascites, imaging findings concerning for malignant process.

## 2024-08-03 ENCOUNTER — Observation Stay (HOSPITAL_COMMUNITY)

## 2024-08-03 DIAGNOSIS — Z91040 Latex allergy status: Secondary | ICD-10-CM | POA: Diagnosis not present

## 2024-08-03 DIAGNOSIS — R188 Other ascites: Secondary | ICD-10-CM | POA: Diagnosis present

## 2024-08-03 DIAGNOSIS — Z79899 Other long term (current) drug therapy: Secondary | ICD-10-CM | POA: Diagnosis not present

## 2024-08-03 DIAGNOSIS — J90 Pleural effusion, not elsewhere classified: Secondary | ICD-10-CM | POA: Diagnosis present

## 2024-08-03 DIAGNOSIS — K5730 Diverticulosis of large intestine without perforation or abscess without bleeding: Secondary | ICD-10-CM | POA: Diagnosis present

## 2024-08-03 DIAGNOSIS — R0602 Shortness of breath: Secondary | ICD-10-CM | POA: Diagnosis present

## 2024-08-03 DIAGNOSIS — Z9012 Acquired absence of left breast and nipple: Secondary | ICD-10-CM | POA: Diagnosis not present

## 2024-08-03 DIAGNOSIS — J9601 Acute respiratory failure with hypoxia: Secondary | ICD-10-CM | POA: Diagnosis not present

## 2024-08-03 DIAGNOSIS — I2699 Other pulmonary embolism without acute cor pulmonale: Secondary | ICD-10-CM | POA: Diagnosis not present

## 2024-08-03 DIAGNOSIS — J189 Pneumonia, unspecified organism: Secondary | ICD-10-CM | POA: Diagnosis present

## 2024-08-03 DIAGNOSIS — E876 Hypokalemia: Secondary | ICD-10-CM | POA: Diagnosis present

## 2024-08-03 DIAGNOSIS — J440 Chronic obstructive pulmonary disease with acute lower respiratory infection: Secondary | ICD-10-CM | POA: Diagnosis present

## 2024-08-03 DIAGNOSIS — Z86711 Personal history of pulmonary embolism: Secondary | ICD-10-CM | POA: Diagnosis not present

## 2024-08-03 DIAGNOSIS — I110 Hypertensive heart disease with heart failure: Secondary | ICD-10-CM | POA: Diagnosis present

## 2024-08-03 DIAGNOSIS — Z79890 Hormone replacement therapy (postmenopausal): Secondary | ICD-10-CM | POA: Diagnosis not present

## 2024-08-03 DIAGNOSIS — E871 Hypo-osmolality and hyponatremia: Secondary | ICD-10-CM | POA: Diagnosis present

## 2024-08-03 DIAGNOSIS — I509 Heart failure, unspecified: Secondary | ICD-10-CM | POA: Diagnosis present

## 2024-08-03 DIAGNOSIS — I2782 Chronic pulmonary embolism: Secondary | ICD-10-CM | POA: Diagnosis present

## 2024-08-03 DIAGNOSIS — Z882 Allergy status to sulfonamides status: Secondary | ICD-10-CM | POA: Diagnosis not present

## 2024-08-03 DIAGNOSIS — Z87891 Personal history of nicotine dependence: Secondary | ICD-10-CM | POA: Diagnosis not present

## 2024-08-03 DIAGNOSIS — J9621 Acute and chronic respiratory failure with hypoxia: Secondary | ICD-10-CM | POA: Diagnosis present

## 2024-08-03 DIAGNOSIS — K7460 Unspecified cirrhosis of liver: Secondary | ICD-10-CM | POA: Diagnosis present

## 2024-08-03 DIAGNOSIS — Z1152 Encounter for screening for COVID-19: Secondary | ICD-10-CM | POA: Diagnosis not present

## 2024-08-03 DIAGNOSIS — R918 Other nonspecific abnormal finding of lung field: Secondary | ICD-10-CM | POA: Diagnosis present

## 2024-08-03 DIAGNOSIS — I700 Atherosclerosis of aorta: Secondary | ICD-10-CM | POA: Diagnosis present

## 2024-08-03 DIAGNOSIS — Z888 Allergy status to other drugs, medicaments and biological substances status: Secondary | ICD-10-CM | POA: Diagnosis not present

## 2024-08-03 DIAGNOSIS — E785 Hyperlipidemia, unspecified: Secondary | ICD-10-CM | POA: Diagnosis present

## 2024-08-03 DIAGNOSIS — E039 Hypothyroidism, unspecified: Secondary | ICD-10-CM | POA: Diagnosis present

## 2024-08-03 LAB — PROTEIN, PLEURAL OR PERITONEAL FLUID: Total protein, fluid: 3.5 g/dL

## 2024-08-03 LAB — BODY FLUID CELL COUNT WITH DIFFERENTIAL
Eos, Fluid: 4 %
Lymphs, Fluid: 41 %
Monocyte-Macrophage-Serous Fluid: 7 % — ABNORMAL LOW (ref 50–90)
Neutrophil Count, Fluid: 47 % — ABNORMAL HIGH (ref 0–25)
Other Cells, Fluid: 1 %
Total Nucleated Cell Count, Fluid: 477 uL (ref 0–1000)

## 2024-08-03 LAB — ALBUMIN, PLEURAL OR PERITONEAL FLUID: Albumin, Fluid: 2.1 g/dL

## 2024-08-03 LAB — COMPREHENSIVE METABOLIC PANEL WITH GFR
ALT: 17 U/L (ref 0–44)
AST: 24 U/L (ref 15–41)
Albumin: 2.8 g/dL — ABNORMAL LOW (ref 3.5–5.0)
Alkaline Phosphatase: 180 U/L — ABNORMAL HIGH (ref 38–126)
Anion gap: 12 (ref 5–15)
BUN: 15 mg/dL (ref 8–23)
CO2: 29 mmol/L (ref 22–32)
Calcium: 8.9 mg/dL (ref 8.9–10.3)
Chloride: 94 mmol/L — ABNORMAL LOW (ref 98–111)
Creatinine, Ser: 0.88 mg/dL (ref 0.44–1.00)
GFR, Estimated: >60 mL/min (ref >60)
Glucose, Bld: 98 mg/dL (ref 70–99)
Potassium: 3.6 mmol/L (ref 3.5–5.1)
Sodium: 135 mmol/L (ref 135–145)
Total Bilirubin: 0.6 mg/dL (ref 0.0–1.2)
Total Protein: 6.2 g/dL — ABNORMAL LOW (ref 6.5–8.1)

## 2024-08-03 LAB — HEPARIN LEVEL (UNFRACTIONATED)
Heparin Unfractionated: 0.67 [IU]/mL (ref 0.30–0.70)
Heparin Unfractionated: 0.78 [IU]/mL — ABNORMAL HIGH (ref 0.30–0.70)
Heparin Unfractionated: 0.5 [IU]/mL (ref 0.30–0.70)

## 2024-08-03 LAB — CBC
HCT: 42.4 % (ref 36.0–46.0)
Hemoglobin: 14 g/dL (ref 12.0–15.0)
MCH: 31.2 pg (ref 26.0–34.0)
MCHC: 33 g/dL (ref 30.0–36.0)
MCV: 94.4 fL (ref 80.0–100.0)
Platelets: 686 K/uL — ABNORMAL HIGH (ref 150–400)
RBC: 4.49 MIL/uL (ref 3.87–5.11)
RDW: 12.4 % (ref 11.5–15.5)
WBC: 14.8 K/uL — ABNORMAL HIGH (ref 4.0–10.5)
nRBC: 0 % (ref 0.0–0.2)

## 2024-08-03 LAB — BRAIN NATRIURETIC PEPTIDE: B Natriuretic Peptide: 94.6 pg/mL (ref 0.0–100.0)

## 2024-08-03 LAB — MAGNESIUM: Magnesium: 2 mg/dL (ref 1.7–2.4)

## 2024-08-03 MED ORDER — LIDOCAINE HCL (PF) 1 % IJ SOLN
10.0000 mL | Freq: Once | INTRAMUSCULAR | Status: AC
  Administered 2024-08-03: 10 mL

## 2024-08-03 MED ORDER — FUROSEMIDE 10 MG/ML IJ SOLN
20.0000 mg | Freq: Once | INTRAMUSCULAR | Status: AC
  Administered 2024-08-03: 20 mg via INTRAVENOUS
  Filled 2024-08-03: qty 2

## 2024-08-03 NOTE — Progress Notes (Signed)
 PHARMACY - ANTICOAGULATION CONSULT NOTE  Pharmacy Consult for IV heparin  Indication: pulmonary embolus  Allergies  Allergen Reactions   Ace Inhibitors     Other reaction(s): Cough (ALLERGY/intolerance)   Benzalkonium Chloride Other (See Comments)    Other reaction(s): Other (See Comments), Other (See Comments) Caused an infection Caused an infection    Levothyroxine  Sodium Other (See Comments)    Other reaction(s): troubles in the past, wants brand Synthroid    Latex Rash   Sulfa Antibiotics Other (See Comments)    Patient cannot recall if she has an allergy.  She does not take this medication due to it being ineffective.     Patient Measurements: Height: 5' 5 (165.1 cm) Weight: 58.8 kg (129 lb 10.1 oz) IBW/kg (Calculated) : 57 HEPARIN  DW (KG): 61.2  Vital Signs: Temp: 97.6 F (36.4 C) (09/21 2017) BP: 125/67 (09/21 2017) Pulse Rate: 81 (09/21 2017)  Labs: Recent Labs    08/02/24 1601 08/02/24 1608 08/02/24 1916 08/03/24 0527 08/03/24 1400 08/03/24 2241  HGB 14.5 15.6*  --  14.0  --   --   HCT 43.0 46.0  --  42.4  --   --   PLT 661*  --   --  686*  --   --   LABPROT  --   --  14.1  --   --   --   INR  --   --  1.0  --   --   --   HEPARINUNFRC  --   --   --  0.67 0.78* 0.50  CREATININE 0.79 0.90  --  0.88  --   --   TROPONINIHS 13  --  15  --   --   --     Estimated Creatinine Clearance: 45.9 mL/min (by C-G formula based on SCr of 0.88 mg/dL).   Medical History: Past Medical History:  Diagnosis Date   Breast cancer (HCC)    had left mastectomy.    COPD (chronic obstructive pulmonary disease) (HCC)    Endometriosis     Medications:  Medications Prior to Admission  Medication Sig Dispense Refill Last Dose/Taking   acetaminophen  (TYLENOL ) 500 MG tablet Take 500 mg by mouth every 6 (six) hours as needed for mild pain (pain score 1-3) or headache.   07/31/2024   albuterol  (VENTOLIN  HFA) 108 (90 Base) MCG/ACT inhaler Inhale 2 puffs into the lungs every 4  (four) hours as needed for wheezing or shortness of breath. 72 each 3 Past Month   amLODipine (NORVASC) 5 MG tablet Take 5 mg by mouth daily.   07/31/2024   budesonide -glycopyrrolate -formoterol  (BREZTRI  AEROSPHERE) 160-9-4.8 MCG/ACT AERO inhaler Inhale 2 puffs into the lungs in the morning and at bedtime. 3 each 3 08/01/2024 Morning   chlorthalidone (HYGROTON) 25 MG tablet Take 25 mg by mouth in the morning.   07/31/2024   Cholecalciferol 50 MCG (2000 UT) CAPS Take 2,000 Units by mouth in the morning.   07/31/2024   furosemide  (LASIX ) 40 MG tablet Take 1 tablet (40 mg total) by mouth daily. 30 tablet 5 Past Week   losartan (COZAAR) 25 MG tablet Take 25 mg by mouth daily.   07/31/2024   OXYGEN Inhale 3 L into the lungs at bedtime.   08/01/2024 Bedtime   promethazine -dextromethorphan (PROMETHAZINE -DM) 6.25-15 MG/5ML syrup Take 5 mLs by mouth 4 (four) times daily as needed for cough. 118 mL 0 08/02/2024 Morning   SYNTHROID  100 MCG tablet Take 100 mcg by mouth every morning.   Taking  Assessment: 78 yo F presenting with linear segmental pulmonary embolism in the L lung. Not on anticoagulation PTA. Pharmacy consulted to dose IV heparin .  CBC stable: Hgb 14, Plt: 686. Had paracentesis today with no bleeding noted.  Heparin  level is therapeutic this evening at 0.50 on 1000 units/hr. No issues with heparin  infusion or bleeding reported..    Goal of Therapy:  Heparin  level 0.3-0.7 units/ml Monitor platelets by anticoagulation protocol: Yes   Plan:  - Continue heparin  infusion at 1000 units/hr - Check anti-Xa level in 8 hours and daily while on heparin  - Continue to monitor H&H and platelets   Thank you for allowing pharmacy to be a part of this patient's care.   Bascom JAYSON Louder, PharmD 08/03/2024 11:32 PM  **Pharmacist phone directory can be found on [email or web address] listed under [facility]**

## 2024-08-03 NOTE — Plan of Care (Signed)

## 2024-08-03 NOTE — Progress Notes (Signed)
 PROGRESS NOTE    Kara Newman  FMW:[id number] DOB: [birth year] DOA: 08/02/2024 PCP: Elliot Charm  Chief Complaint  Patient presents with   Shortness of Breath    Brief Narrative:   Kara Newman is Kara Newman 78 y.o. female with medical history significant for COPD, left breast cancer s/p mastectomy, HTN, HLD, hypothyroidism who is admitted with acute hypoxic respiratory failure, large volume ascites, imaging findings concerning for malignant process.   Assessment & Plan:   Principal Problem:   Acute respiratory failure with hypoxia (HCC) Active Problems:   Chronic obstructive pulmonary disease (HCC)   Essential hypertension   Hypothyroidism   Pulmonary embolism (HCC)   Ascites  Acute Hypoxic Respiratory Failure Bilateral Multifocal Patchy Opacities Large Volume Ascites Currently requiring 3 L  CT with chronic pulmonary embolism in L lung as well as multifocal patchy opacities in lungs bilaterally, small bilateral effusions Treating for CAP with ceftriaxone  and azithromycin   Echo 07/30/24 with grade 1 diastolic dysfunction, moderately elevated PASP Will diurese as tolerated as well - started on spironolactone , lasix  as tolerated  Chronic Pulmonary Embolism Follow LE US  Echo was done on 9/17 showing normal RVSF, mildly enlarged RV.  Moderately elevated PASP. 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   Consultants:  IR  Procedures:   9/21 paracentesis  Antimicrobials:  Anti-infectives (From admission, onward)    Start     Dose/Rate Route Frequency Ordered Stop   08/03/24 1900  azithromycin  (ZITHROMAX ) 500 mg in sodium chloride  0.9 % 250 mL IVPB        500 mg 250 mL/hr over 60 Minutes Intravenous Every 24 hours 08/02/24 2119     08/03/24 1900  cefTRIAXone  (ROCEPHIN ) 1 g in sodium chloride  0.9 % 100 mL IVPB        1 g 200 mL/hr over 30 Minutes Intravenous Every 24 hours 08/02/24 2119     08/02/24 1900  cefTRIAXone  (ROCEPHIN ) 1 g in sodium chloride  0.9 % 100 mL IVPB        1 g 200 mL/hr over 30 Minutes Intravenous  Once 08/02/24 1851 08/02/24 2100   08/02/24 1900  azithromycin  (ZITHROMAX ) 500 mg in sodium chloride  0.9 % 250 mL IVPB        500 mg 250 mL/hr over 60 Minutes Intravenous  Once 08/02/24 1851 08/02/24 2245       Subjective: Feels better after para, breathing better  Objective: Vitals:   08/03/24 0817 08/03/24 0930 08/03/24 1037 08/03/24 1335  BP:  126/74 (!) 116/52 116/64  Pulse: 76 72 76 84  Resp: 18     Temp:    (!) 97 F (36.1 C)  TempSrc:      SpO2:   97% 98%  Weight:      Height:  Intake/Output Summary (Last 24 hours) at 08/03/2024 1440 Last data filed at 08/03/2024 0600 Gross per 24 hour  Intake --  Output 600 ml  Net -600 ml   Filed Weights   08/02/24 1502 08/02/24 2017 08/03/24 0500  Weight: 61.2 kg 61.2 kg 58.8 kg    Examination:  General exam: Appears calm and comfortable  Respiratory system: unlabored Cardiovascular system: RRR Gastrointestinal system: distended Central nervous system: Alert and oriented. No focal neurological deficits. Extremities: no  LEE    Data Reviewed: I have personally reviewed following labs and imaging studies  CBC: Recent Labs  Lab 08/02/24 1601 08/02/24 1608 08/03/24 0527  WBC 15.5*  --  14.8*  NEUTROABS 12.6*  --   --   HGB 14.5 15.6* 14.0  HCT 43.0 46.0 42.4  MCV 95.1  --  94.4  PLT 661*  --  686*    Basic Metabolic Panel: Recent Labs  Lab 07/31/24 1601 08/02/24 1601 08/02/24 1608 08/03/24 0527  NA 130* 132* 133* 135  K 3.2* 3.1* 3.0* 3.6  CL 90* 88* 89* 94*  CO2 32 28  --  29  GLUCOSE 125* 117* 121* 98  BUN 21 17 20 15   CREATININE 0.86 0.79 0.90 0.88  CALCIUM 9.4 9.1  --  8.9  MG  --   --   --  2.0    GFR: Estimated Creatinine Clearance: 45.9 mL/min (by C-G formula based on SCr of 0.88 mg/dL).  Liver Function Tests: Recent Labs  Lab 08/02/24 1601 08/03/24 0527  AST 25 24  ALT 18 17  ALKPHOS 189* 180*  BILITOT 0.8 0.6  PROT 6.5 6.2*  ALBUMIN 3.0* 2.8*    CBG: No results for input(s): GLUCAP in the last 168 hours.   Recent Results (from the past 240 hours)  Resp panel by RT-PCR (RSV, Flu Asianae Minkler&B, Covid) Anterior Nasal Swab     Status: None   Collection Time: 08/02/24  4:26 PM   Specimen: Anterior Nasal Swab  Result Value Ref Range Status   SARS Coronavirus 2 by RT PCR NEGATIVE NEGATIVE Final   Influenza Maury Groninger by PCR NEGATIVE NEGATIVE Final   Influenza B by PCR NEGATIVE NEGATIVE Final    Comment: (NOTE) The Xpert Xpress SARS-CoV-2/FLU/RSV plus assay is intended as an aid in the diagnosis of influenza from Nasopharyngeal swab specimens and should not be used as Jai Steil sole basis for treatment. Nasal washings and aspirates are unacceptable for Xpert Xpress SARS-CoV-2/FLU/RSV testing.  Fact Sheet for Patients: BloggerCourse.com  Fact Sheet for Healthcare Providers: SeriousBroker.it  This test is not yet approved or cleared by the [place]  FDA and has been authorized for detection and/or diagnosis of SARS-CoV-2 by FDA  under an Emergency Use Authorization (EUA). This EUA will remain in effect (meaning this test can be used) for the duration of the COVID-19 declaration under Section 564(b)(1) of the Act, 21 U.S.C. section 360bbb-3(b)(1), unless the authorization is terminated or revoked.     Resp Syncytial Virus by PCR NEGATIVE NEGATIVE Final    Comment: (NOTE) Fact Sheet for Patients: BloggerCourse.com  Fact Sheet for Healthcare Providers: SeriousBroker.it  This test is not yet approved or cleared by the [place]  FDA and has been authorized for detection and/or diagnosis of SARS-CoV-2 by FDA under an Emergency Use Authorization (EUA). This EUA will remain in effect (meaning this test can be used) for the duration of the COVID-19 declaration under Section 564(b)(1) of the Act, 21 U.S.C. section 360bbb-3(b)(1), unless the authorization is terminated  or revoked.  Performed at [facility], [address] [place], [place] [address]   Blood culture (routine x 2)     Status: None (Preliminary result)   Collection Time: 08/02/24  7:16 PM   Specimen: BLOOD RIGHT ARM  Result Value Ref Range Status   Specimen Description BLOOD RIGHT ARM  Final   Special Requests   Final    BOTTLES DRAWN AEROBIC AND ANAEROBIC Blood Culture adequate volume   Culture   Final    NO GROWTH < 12 HOURS Performed at [facility], [address], [place], [place] [address]    Report Status PENDING  Incomplete  Blood culture (routine x 2)     Status: None (Preliminary result)   Collection Time: 08/02/24 10:45 PM   Specimen: BLOOD RIGHT HAND  Result Value Ref Range Status   Specimen Description BLOOD RIGHT HAND  Final   Special Requests   Final    BOTTLES DRAWN AEROBIC AND ANAEROBIC Blood Culture adequate volume   Culture   Final    NO GROWTH <12 HOURS Performed at [facility], [address], [place], [place] [address]    Report Status PENDING   Incomplete         Radiology Studies: US  Paracentesis Result Date: 08/03/2024 INDICATION: Patient with ascites of uncertain etiology presents today for Deangleo Passage diagnostic and therapeutic paracentesis. EXAM: ULTRASOUND GUIDED PARACENTESIS MEDICATIONS: 1% lidocaine  10 mL COMPLICATIONS: None immediate. PROCEDURE: Informed written consent was obtained from the patient after Chaseton Yepiz discussion of the risks, benefits and alternatives to treatment. Zamariya Neal timeout was performed prior to the initiation of the procedure. Initial ultrasound scanning demonstrates Kesley Gaffey large amount of ascites within the right lower abdominal quadrant. The right lower abdomen was prepped and draped in the usual sterile fashion. 1% lidocaine  was used for local anesthesia. Following this, Daya Dutt 19 gauge, 7-cm, Yueh catheter was introduced. An ultrasound image was saved for documentation purposes. The paracentesis was performed. The catheter was removed and Angella Montas dressing was applied. The patient tolerated the procedure well without immediate post procedural complication. FINDINGS: Shamari Lofquist total of approximately 3 L of clear yellow fluid was removed. Samples were sent to the laboratory as requested by the clinical team. IMPRESSION: Successful ultrasound-guided paracentesis yielding 3 liters of peritoneal fluid. Procedure performed by Warren Dais, NP Electronically Signed   By: Cordella Banner   On: 08/03/2024 10:55   CT Angio Chest PE W and/or Wo Contrast Addendum Date: 08/02/2024 *ADDENDUM #1 * ADDENDUM: Critical Value/emergent results were called by telephone at the time of interpretation on 08/02/2024 at 2036 hrs to provider Dr Ginger. ---------------------------------------------------- Electronically signed by: Pinkie Pebbles MD 08/02/2024 08:49 PM EDT RP Workstation: [id number]   Result Date: 08/02/2024 *ORIGINAL REPORT * EXAM: CTA of the Chest with contrast for PE 08/02/2024 08:10:00 PM TECHNIQUE: CTA of the chest was performed after the administration of  intravenous contrast. Multiplanar reformatted images are provided for review. MIP images are provided for review. Automated exposure control, iterative reconstruction, and/or weight based adjustment of the mA/kV was utilized to reduce the radiation dose to as low as reasonably achievable. COMPARISON: Chest radiograph earlier today. CLINICAL HISTORY: Pulmonary embolism (PE) suspected, high prob. Chief complaints; Shortness of Breath; CT Angio Chest PE W and/or Wo Contrast; Pulmonary embolism (PE) suspected, high prob. FINDINGS: PULMONARY ARTERIES: Peripheral linear filling defect at the bifurcation of the distal left main pulmonary artery (image 61) extending into the segmental lingular pulmonary artery (image 56) as well  as the anterior segment left lower lobe (image 85) suggesting mild segmental pulmonary embolism, although possibly chronic given the appearance. MEDIASTINUM: No evidence of right heart strain. Thoracic aortic atherosclerosis. Mild coronary atherosclerosis of the LAD. LYMPH NODES: Small mediastinal lymph nodes which do not need pathologic CT size criteria. LUNGS AND PLEURA: Small bilateral pleural effusions, right greater than left. Multifocal patchy/nodular opacities in the lungs bilaterally, including Makiah Clauson 2.4 x 2.0 cm nodular opacity in the anteromedial left upper lobe (image 42) and Mason Dibiasio 4.3 x 3.2 cm mass like opacity in the inferomedial right upper lobe (image 89). This appearance raises concern for multifocal tumor, less likely multifocal infection/pneumonia. Very mild interlobular septal thickening in the upper lobes but this appearance is not favored to reflect interstitial edema. UPPER ABDOMEN: Upper abdomen is better evaluated on recent CT abdomen/pelvis. 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 Mild degenerative changes of the mid thoracic spine. IMPRESSION: 1. 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 No  evidence of right heart strain. 2. Multifocal patchy opacities in the lungs bilaterally, some of which may reflect infection/pneumonia, although the appearance is concerning for underlying multifocal tumor. Follow-up CT chest is suggested in 3-4 weeks after appropriate antimicrobial therapy. 3. Small bilateral pleural effusions, right greater than left. Electronically signed by: Pinkie Pebbles MD 08/02/2024 08:40 PM EDT RP Workstation: [id number]   CT ABDOMEN PELVIS W CONTRAST Result Date: 08/02/2024 CLINICAL DATA:  Bowel obstruction suspected. Distended abdomen. Lower extremity swelling. EXAM: CT ABDOMEN AND PELVIS WITH CONTRAST TECHNIQUE: Multidetector CT imaging of the abdomen and pelvis was performed using the standard protocol following bolus administration of intravenous contrast. RADIATION DOSE REDUCTION: This exam was performed according to the departmental dose-optimization program which includes automated exposure control, adjustment of the mA and/or kV according to patient size and/or use of iterative reconstruction technique. CONTRAST:  75mL OMNIPAQUE  IOHEXOL  350 MG/ML SOLN COMPARISON:  None Available. FINDINGS: Lower chest: Extensive nodular appearing airspace disease peripherally in both lower lungs. Small bilateral pleural effusions. Heart is normal size. Hepatobiliary: Liver appears shrunken with suggestion of nodular surface suggesting cirrhosis. Recommend clinical correlation. Gallbladder unremarkable. No biliary ductal dilatation. Pancreas: Extensive cystic changes throughout the pancreatic body and tail. No surrounding inflammation or ductal dilatation. Spleen: No focal abnormality.  Normal size. Adrenals/Urinary Tract: No suspicious renal or adrenal abnormality. Small scattered bilateral renal cysts. No stones or hydronephrosis. Urinary bladder unremarkable. Stomach/Bowel: Left colonic diverticulosis. No active diverticulitis. Stomach and small bowel decompressed. Vascular/Lymphatic: Aortic  atherosclerosis. No evidence of aneurysm or adenopathy. Reproductive: Uterus appears very small.  No adnexal mass. Other: Large volume ascites in the abdomen and pelvis. There appears to be abnormal infiltrated/nodular appearance of the omentum anteriorly. Musculoskeletal: No acute bony abnormality. IMPRESSION: Extensive peripheral nodular airspace disease throughout both lower lungs with small bilateral pleural effusions. This could reflect infectious or neoplastic process. Insert further evaluation with full dedicated chest CT with IV contrast. 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 Large volume ascites. Abnormal infiltrated/nodular appearance of the omentum in the anterior abdomen. Cannot exclude peritoneal malignancy/carcinomatosis. Left colonic diverticulosis.  No active diverticulitis. Aortic atherosclerosis. Electronically Signed   By: Franky Crease M.D.   On: 08/02/2024 18:15   DG Chest Portable 1 View Result Date: 08/02/2024 CLINICAL DATA:  SOB, rales, edema. EXAM: PORTABLE CHEST 1 VIEW COMPARISON:  06/05/2024. FINDINGS: Bilateral lungs appear hyperlucent with coarse bronchovascular markings, in keeping with COPD. There are diffuse increased interstitial markings  overlying the bilateral lower lung zones with associated new bilateral small pleural effusions, favoring congestive heart failure/pulmonary edema. There are retrocardiac opacities partially obscuring the left medial hemidiaphragm, which may represent left lung atelectasis and/or consolidation. Correlate clinically. Stable cardio-mediastinal silhouette. No acute osseous abnormalities. The soft tissues are within normal limits. IMPRESSION: 1. Findings favor congestive heart failure/pulmonary edema. 2. There are retrocardiac opacities partially obscuring the left medial hemidiaphragm, which may represent left lung atelectasis and/or consolidation. Electronically Signed   By: Ree Molt M.D.   On: 08/02/2024 16:44         Scheduled Meds:  budesonide -glycopyrrolate -formoterol   2 puff Inhalation BID   furosemide   20 mg Oral Daily   levothyroxine   100 mcg Oral q morning   sodium chloride  flush  3 mL Intravenous Q12H   spironolactone   50 mg Oral Daily   Continuous Infusions:  azithromycin      cefTRIAXone  (ROCEPHIN )  IV     heparin  1,050 Units/hr (08/02/24 2255)     LOS: 0 days    Time spent: over 30 min     Meliton Monte, MD [facility]   To contact the attending provider between 7A-7P or the covering provider during after hours 7P-7A, please log into the web site [email or web address] and access using universal [place] password for that web site. If you do not have the password, please call the hospital operator.  08/03/2024, 2:40 PM

## 2024-08-03 NOTE — Plan of Care (Signed)

## 2024-08-03 NOTE — Progress Notes (Signed)
 PHARMACY - ANTICOAGULATION CONSULT NOTE  Pharmacy Consult for IV heparin  Indication: pulmonary embolus  Allergies  Allergen Reactions   Ace Inhibitors     Other reaction(s): Cough (ALLERGY/intolerance)   Benzalkonium Chloride Other (See Comments)    Other reaction(s): Other (See Comments), Other (See Comments) Caused an infection Caused an infection    Levothyroxine  Sodium Other (See Comments)    Other reaction(s): troubles in the past, wants brand Synthroid    Latex Rash   Sulfa Antibiotics Other (See Comments)    Patient cannot recall if she has an allergy.  She does not take this medication due to it being ineffective.     Patient Measurements: Height: 5' 5 (165.1 cm) Weight: 58.8 kg (129 lb 10.1 oz) IBW/kg (Calculated) : 57 HEPARIN  DW (KG): 61.2  Vital Signs: Temp: 97 F (36.1 C) (09/21 1335) BP: 116/64 (09/21 1335) Pulse Rate: 84 (09/21 1335)  Labs: Recent Labs    08/02/24 1601 08/02/24 1608 08/02/24 1916 08/03/24 0527 08/03/24 1400  HGB 14.5 15.6*  --  14.0  --   HCT 43.0 46.0  --  42.4  --   PLT 661*  --   --  686*  --   LABPROT  --   --  14.1  --   --   INR  --   --  1.0  --   --   HEPARINUNFRC  --   --   --  0.67 0.78*  CREATININE 0.79 0.90  --  0.88  --   TROPONINIHS 13  --  15  --   --     Estimated Creatinine Clearance: 45.9 mL/min (by C-G formula based on SCr of 0.88 mg/dL).   Medical History: Past Medical History:  Diagnosis Date   Breast cancer (HCC)    had left mastectomy.    COPD (chronic obstructive pulmonary disease) (HCC)    Endometriosis     Medications:  Medications Prior to Admission  Medication Sig Dispense Refill Last Dose/Taking   acetaminophen  (TYLENOL ) 500 MG tablet Take 500 mg by mouth every 6 (six) hours as needed for mild pain (pain score 1-3) or headache.   07/31/2024   albuterol  (VENTOLIN  HFA) 108 (90 Base) MCG/ACT inhaler Inhale 2 puffs into the lungs every 4 (four) hours as needed for wheezing or shortness of breath.  72 each 3 Past Month   amLODipine (NORVASC) 5 MG tablet Take 5 mg by mouth daily.   07/31/2024   budesonide -glycopyrrolate -formoterol  (BREZTRI  AEROSPHERE) 160-9-4.8 MCG/ACT AERO inhaler Inhale 2 puffs into the lungs in the morning and at bedtime. 3 each 3 08/01/2024 Morning   chlorthalidone (HYGROTON) 25 MG tablet Take 25 mg by mouth in the morning.   07/31/2024   Cholecalciferol 50 MCG (2000 UT) CAPS Take 2,000 Units by mouth in the morning.   07/31/2024   furosemide  (LASIX ) 40 MG tablet Take 1 tablet (40 mg total) by mouth daily. 30 tablet 5 Past Week   losartan (COZAAR) 25 MG tablet Take 25 mg by mouth daily.   07/31/2024   OXYGEN Inhale 3 L into the lungs at bedtime.   08/01/2024 Bedtime   promethazine -dextromethorphan (PROMETHAZINE -DM) 6.25-15 MG/5ML syrup Take 5 mLs by mouth 4 (four) times daily as needed for cough. 118 mL 0 08/02/2024 Morning   SYNTHROID  100 MCG tablet Take 100 mcg by mouth every morning.   Taking    Assessment: 78 yo F presenting with linear segmental pulmonary embolism in the L lung. Not on anticoagulation PTA. Pharmacy consulted to  dose IV heparin .  Patient supratherapeutic on 1050 units/h. Will decrease. CBC stable: Hgb 14, Plt: 686. Had paracentesis today with no bleeding noted. No complications to heparin  gtt per RN.   Goal of Therapy:  Heparin  level 0.3-0.7 units/ml Monitor platelets by anticoagulation protocol: Yes   Plan:  - Decrease heparin  infusion to 1000 units/hr - Check anti-Xa level in 8 hours and daily while on heparin  - Continue to monitor H&H and platelets  Elma Fail 08/03/2024,2:35 PM

## 2024-08-03 NOTE — Progress Notes (Signed)
 PHARMACY - ANTICOAGULATION  Pharmacy Consult for heparin  Indication: pulmonary embolus Brief A/P: Heparin  level within goal range Continue Heparin  at current rate   Allergies  Allergen Reactions   Ace Inhibitors     Other reaction(s): Cough (ALLERGY/intolerance)   Benzalkonium Chloride Other (See Comments)    Other reaction(s): Other (See Comments), Other (See Comments) Caused an infection Caused an infection    Levothyroxine  Sodium Other (See Comments)    Other reaction(s): troubles in the past, wants brand Synthroid    Latex Rash   Sulfa Antibiotics Other (See Comments)    Patient cannot recall if she has an allergy.  She does not take this medication due to it being ineffective.     Patient Measurements: Height: 5' 5 (165.1 cm) Weight: 58.8 kg (129 lb 10.1 oz) IBW/kg (Calculated) : 57 HEPARIN  DW (KG): 61.2  Vital Signs: Temp: 97.8 F (36.6 C) (09/21 0602) Temp Source: Oral (09/20 2219) BP: 122/73 (09/21 0605) Pulse Rate: 82 (09/21 0605)  Labs: Recent Labs    07/31/24 1601 08/02/24 1601 08/02/24 1601 08/02/24 1608 08/02/24 1916 08/03/24 0527  HGB  --  14.5   < > 15.6*  --  14.0  HCT  --  43.0  --  46.0  --  42.4  PLT  --  661*  --   --   --  686*  LABPROT  --   --   --   --  14.1  --   INR  --   --   --   --  1.0  --   HEPARINUNFRC  --   --   --   --   --  0.67  CREATININE 0.86 0.79  --  0.90  --   --   TROPONINIHS  --  13  --   --  15  --    < > = values in this interval not displayed.    Estimated Creatinine Clearance: 44.9 mL/min (by C-G formula based on SCr of 0.9 mg/dL).  Assessment: 78 y.o. female with PE for heparin   Goal of Therapy:  Heparin  level 0.3-0.7 units/ml Monitor platelets by anticoagulation protocol: Yes   Plan:  .Continue Heparin  at current rate    Cathlyn Arrant, PharmD, BCPS  08/03/2024 6:36 AM

## 2024-08-03 NOTE — Progress Notes (Signed)
 Pt states LBM 9/9 upon admit. Endorses little appetite PTA. Given Senna PRN. CT Abd/Pel appears unremarkable for impaction & denies s/s. Paracentesis pending. Attending aware. Pt in no acute distress. Heparin  gtt infusing per Order. Will continue to monitor.

## 2024-08-03 NOTE — Care Management Obs Status (Signed)
 MEDICARE OBSERVATION STATUS NOTIFICATION   Patient Details  Name: Kara Newman MRN: [id number] Date of Birth: 08-Sep-1946   Medicare Observation Status Notification Given:  Yes    Donney Caraveo G., RN 08/03/2024, 9:12 AM

## 2024-08-03 NOTE — Procedures (Signed)
 PROCEDURE SUMMARY:  Successful US  guided paracentesis from right lower quadrant.  Yielded 3 L of clear yellow fluid.  No immediate complications.  Pt tolerated well.   Specimen sent for labs.  EBL < 2 mL  Warren JONELLE Dais, NP 08/03/2024 10:53 AM

## 2024-08-03 NOTE — Progress Notes (Signed)
 Patient in US  for paracentesis, tolerated procedure well, 3L of fluid removed.  Band-Aid placed by US  tech, no bleeding at site noted.  Patient remains on Heparin  gtt without complications.

## 2024-08-04 ENCOUNTER — Other Ambulatory Visit (HOSPITAL_COMMUNITY): Payer: Self-pay

## 2024-08-04 ENCOUNTER — Inpatient Hospital Stay (HOSPITAL_COMMUNITY)

## 2024-08-04 ENCOUNTER — Telehealth (HOSPITAL_COMMUNITY): Payer: Self-pay | Admitting: Pharmacy Technician

## 2024-08-04 DIAGNOSIS — Z86711 Personal history of pulmonary embolism: Secondary | ICD-10-CM

## 2024-08-04 DIAGNOSIS — J9601 Acute respiratory failure with hypoxia: Secondary | ICD-10-CM | POA: Diagnosis not present

## 2024-08-04 LAB — COMPREHENSIVE METABOLIC PANEL WITH GFR
ALT: 15 U/L (ref 0–44)
AST: 21 U/L (ref 15–41)
Albumin: 2.3 g/dL — ABNORMAL LOW (ref 3.5–5.0)
Alkaline Phosphatase: 150 U/L — ABNORMAL HIGH (ref 38–126)
Anion gap: 10 (ref 5–15)
BUN: 13 mg/dL (ref 8–23)
CO2: 29 mmol/L (ref 22–32)
Calcium: 8.1 mg/dL — ABNORMAL LOW (ref 8.9–10.3)
Chloride: 93 mmol/L — ABNORMAL LOW (ref 98–111)
Creatinine, Ser: 0.85 mg/dL (ref 0.44–1.00)
GFR, Estimated: >60 mL/min (ref >60)
Glucose, Bld: 104 mg/dL — ABNORMAL HIGH (ref 70–99)
Potassium: 3.1 mmol/L — ABNORMAL LOW (ref 3.5–5.1)
Sodium: 132 mmol/L — ABNORMAL LOW (ref 135–145)
Total Bilirubin: 0.6 mg/dL (ref 0.0–1.2)
Total Protein: 5.1 g/dL — ABNORMAL LOW (ref 6.5–8.1)

## 2024-08-04 LAB — CBC WITH DIFFERENTIAL/PLATELET
Abs Immature Granulocytes: 0.07 K/uL (ref 0.00–0.07)
Basophils Absolute: 0.1 K/uL (ref 0.0–0.1)
Basophils Relative: 1 %
Eosinophils Absolute: 0.1 K/uL (ref 0.0–0.5)
Eosinophils Relative: 1 %
HCT: 36 % (ref 36.0–46.0)
Hemoglobin: 12.1 g/dL (ref 12.0–15.0)
Immature Granulocytes: 1 %
Lymphocytes Relative: 9 %
Lymphs Abs: 1.4 K/uL (ref 0.7–4.0)
MCH: 31.6 pg (ref 26.0–34.0)
MCHC: 33.6 g/dL (ref 30.0–36.0)
MCV: 94 fL (ref 80.0–100.0)
Monocytes Absolute: 1.5 K/uL — ABNORMAL HIGH (ref 0.1–1.0)
Monocytes Relative: 10 %
Neutro Abs: 12 K/uL — ABNORMAL HIGH (ref 1.7–7.7)
Neutrophils Relative %: 78 %
Platelets: 557 K/uL — ABNORMAL HIGH (ref 150–400)
RBC: 3.83 MIL/uL — ABNORMAL LOW (ref 3.87–5.11)
RDW: 12.6 % (ref 11.5–15.5)
WBC: 15.1 K/uL — ABNORMAL HIGH (ref 4.0–10.5)
nRBC: 0 % (ref 0.0–0.2)

## 2024-08-04 LAB — HEPARIN LEVEL (UNFRACTIONATED): Heparin Unfractionated: 0.52 [IU]/mL (ref 0.30–0.70)

## 2024-08-04 LAB — MAGNESIUM: Magnesium: 1.8 mg/dL (ref 1.7–2.4)

## 2024-08-04 LAB — PHOSPHORUS: Phosphorus: 2.7 mg/dL (ref 2.5–4.6)

## 2024-08-04 MED ORDER — POTASSIUM CHLORIDE CRYS ER 20 MEQ PO TBCR
40.0000 meq | EXTENDED_RELEASE_TABLET | ORAL | Status: AC
  Administered 2024-08-04 (×2): 40 meq via ORAL
  Filled 2024-08-04 (×2): qty 2

## 2024-08-04 MED ORDER — CEFTRIAXONE SODIUM 2 G IJ SOLR
2.0000 g | INTRAMUSCULAR | Status: AC
  Administered 2024-08-04 – 2024-08-06 (×3): 2 g via INTRAVENOUS
  Filled 2024-08-04 (×3): qty 20

## 2024-08-04 MED ORDER — FUROSEMIDE 10 MG/ML IJ SOLN
20.0000 mg | Freq: Once | INTRAMUSCULAR | Status: AC
  Administered 2024-08-04: 20 mg via INTRAVENOUS
  Filled 2024-08-04: qty 2

## 2024-08-04 NOTE — Evaluation (Signed)
 Occupational Therapy Evaluation Patient Details Name: Kara Newman MRN: [id number] DOB: 01/26/1946 Today's Date: 08/04/2024   History of Present Illness   78 y.o. female presented to the ED 9/20 for evaluation of shortness of breath, abdominal and lower extremity swelling. Admitted with acute hypoxic respiratory failure, large volume ascites, imaging findings concerning for malignant process. S/p 9/21 paracentesis from R lower quadrant yielding 3L. EFY:RNEI, left breast cancer s/p mastectomy, HTN, HLD, hypothyroidism     Clinical Impressions Kara Newman was evaluated s/p the above admission list. She is mod I for ADLs and mobility at baseline. Upon evaluation the pt was limited by pt report of fatigue, generalized weakness and decreased activity tolerance. Overall she needs CGA for simple transfers but declined mobility due to fatigue and lunch coming to room. Due to the deficits listed below the pt also needs up to CGA and increased time for ADLs. Pt will benefit from continued acute OT services to discharge home with continued support of family.       If plan is discharge home, recommend the following:   Assistance with cooking/housework;Assist for transportation;Help with stairs or ramp for entrance     Functional Status Assessment   Patient has had a recent decline in their functional status and demonstrates the ability to make significant improvements in function in a reasonable and predictable amount of time.     Equipment Recommendations   None recommended by OT     Precautions/Restrictions   Precautions Precautions: Fall Restrictions Weight Bearing Restrictions Per Provider Order: No     Mobility Bed Mobility Overal bed mobility: Needs Assistance Bed Mobility: Sit to Supine       Sit to supine: Supervision        Transfers Overall transfer level: Needs assistance Equipment used: Rolling walker (2 wheels) Transfers: Sit to/from Stand Sit to Stand:  Contact guard assist           General transfer comment: session liimited due to pt fatigue and lunch coming      Balance Overall balance assessment: Mild deficits observed, not formally tested               ADL either performed or assessed with clinical judgement   ADL Overall ADL's : Needs assistance/impaired Eating/Feeding: Independent   Grooming: Supervision/safety;Standing   Upper Body Bathing: Set up;Standing   Lower Body Bathing: Contact guard assist;Sit to/from stand   Upper Body Dressing : Set up;Sitting   Lower Body Dressing: Contact guard assist;Sit to/from stand   Toilet Transfer: Contact guard assist   Toileting- Clothing Manipulation and Hygiene: Supervision/safety;Sitting/lateral lean       Functional mobility during ADLs: Contact guard assist General ADL Comments: limited by weakness and activity tolerance     Vision Baseline Vision/History: 0 No visual deficits Vision Assessment?: No apparent visual deficits     Perception Perception: Within Functional Limits       Praxis Praxis: WFL       Pertinent Vitals/Pain Pain Assessment Pain Assessment: No/denies pain     Extremity/Trunk Assessment Upper Extremity Assessment Upper Extremity Assessment: Generalized weakness;Overall [facility] for tasks assessed   Lower Extremity Assessment Lower Extremity Assessment: Defer to PT evaluation   Cervical / Trunk Assessment Cervical / Trunk Assessment: Normal   Communication Communication Communication: No apparent difficulties   Cognition Arousal: Alert Behavior During Therapy: WFL for tasks assessed/performed Cognition: No apparent impairments             Following commands: Intact  Cueing  General Comments   Cueing Techniques: Verbal cues  maintained 2.5L O2 throughout with VSS    Home Living Family/patient expects to be discharged to:: Private residence Living Arrangements: Alone Available Help at Discharge:  Family;Available 24 hours/day Type of Home: Other(Comment) Home Access: Stairs to enter Entrance Stairs-Number of Steps: 3 Entrance Stairs-Rails: Can reach both Home Layout: Able to live on main level with bedroom/bathroom     Bathroom Shower/Tub: Chief Strategy Officer:  (Comfort height) Bathroom Accessibility: Yes   Home Equipment: Agricultural consultant (2 wheels);Cane - single point;Other (comment) (home O2)          Prior Functioning/Environment Prior Level of Function : Independent/Modified Independent             Mobility Comments: furniture surfing, 1 fall 6 days ago ADLs Comments: ADLs, maid and step son assists with errands    OT Problem List: Decreased range of motion;Decreased activity tolerance;Impaired balance (sitting and/or standing);Decreased safety awareness;Decreased knowledge of precautions;Decreased knowledge of use of DME or AE   OT Treatment/Interventions: Self-care/ADL training;Therapeutic exercise;DME and/or AE instruction;Therapeutic activities;Patient/family education;Balance training      OT Goals(Current goals can be found in the care plan section)   Acute Rehab OT Goals Patient Stated Goal: home OT Goal Formulation: With patient Time For Goal Achievement: 08/18/24 Potential to Achieve Goals: Good ADL Goals Pt Will Perform Grooming: with modified independence;standing Pt Will Perform Lower Body Dressing: with modified independence;sit to/from stand Pt Will Transfer to Toilet: with modified independence Additional ADL Goal #1: pt will complete at least 8 minutes OOB activity to demonstrate improved tolerance for ADLs   OT Frequency:  Min 2X/week       AM-PAC OT 6 Clicks Daily Activity     Outcome Measure Help from another person eating meals?: None Help from another person taking care of personal grooming?: A Little Help from another person toileting, which includes using toliet, bedpan, or urinal?: A Little Help from another  person bathing (including washing, rinsing, drying)?: A Little Help from another person to put on and taking off regular upper body clothing?: A Little Help from another person to put on and taking off regular lower body clothing?: A Little 6 Click Score: 19   End of Session Equipment Utilized During Treatment: Oxygen Nurse Communication: Mobility status  Activity Tolerance: Patient limited by fatigue Patient left: in bed;with call bell/phone within reach;with bed alarm set  OT Visit Diagnosis: Unsteadiness on feet (R26.81);Other abnormalities of gait and mobility (R26.89);Muscle weakness (generalized) (M62.81)                Time: 8793-8779 OT Time Calculation (min): 14 min Charges:  OT General Charges $OT Visit: 1 Visit OT Evaluation $OT Eval Moderate Complexity: 1 Mod  Lucie Kendall, OTR/L Acute Rehabilitation Services Office [phone] Secure Chat Communication Preferred   Lucie JONETTA Kendall 08/04/2024, 12:53 PM

## 2024-08-04 NOTE — Progress Notes (Signed)
   08/04/24 1031  TOC Brief Assessment  Insurance and Status Reviewed  Patient has primary care physician Yes  Home environment has been reviewed lives in apartment  Prior level of function: independent  Prior/Current Home Services No current home services  Social Drivers of Health Review SDOH reviewed no interventions necessary  Readmission risk has been reviewed Yes  Transition of care needs no transition of care needs at this time

## 2024-08-04 NOTE — Telephone Encounter (Signed)
 Patient Product/process development scientist completed.    The patient is insured through [employer]. Patient has Medicare and is not eligible for a copay card, but may be able to apply for patient assistance or Medicare RX Payment Plan (Patient Must reach out to their plan, if eligible for payment plan), if available.    Ran test claim for Eliquis  5 mg and the current 30 day co-pay is $53.92.  Ran test claim for Xarelto 20mg  and the current 30 day co-pay is $53.92.  This test claim was processed through [facility]- copay amounts may vary at other pharmacies due to pharmacy/plan contracts, or as the patient moves through the different stages of their insurance plan.     Reyes Sharps, CPHT Pharmacy Technician III Certified Patient Advocate [facility] Patient Advocate Team Direct Number: [phone]  Fax: [phone]

## 2024-08-04 NOTE — Progress Notes (Signed)
 BLE venous duplex has been completed.   Results can be found under chart review under CV PROC. 08/04/2024 1:30 PM Aster Eckrich RVT, RDMS

## 2024-08-04 NOTE — Plan of Care (Signed)

## 2024-08-04 NOTE — TOC Initial Note (Addendum)
 Transition of Care [facility]) - Initial/Assessment Note    Patient Details  Name: Kara Newman MRN: [id number] Date of Birth: 28-Jan-1946  Transition of Care [facility]) CM/SW Contact:    Nola Devere Hands, RN Phone Number: 08/04/2024, 10:38 AM  Clinical Narrative:                 Patient is a 78 yr old female from home, independent, c/o shortness of breath and swelling. On 3L of oxygen at home.  No needs identified at this time, ICM will continue to follow.  1245: Case Manager spoke with patient's son, [name], regarding recommendation for HHPT, received permission to make referral. CM called referral to Darleene Gowda, [facility] Liaison. [name] states that he will be assisting his mom at discharge as well as having others come in.     Barriers to Discharge: Continued Medical Work up   Patient Goals and CMS Choice            Expected Discharge Plan and Services       Living arrangements for the past 2 months: Apartment                                      Prior Living Arrangements/Services Living arrangements for the past 2 months: Apartment Lives with:: Self              Current home services:  (has oxygen at 3L)    Activities of Daily Living   ADL Screening (condition at time of admission) Independently performs ADLs?: No Does the patient have a NEW difficulty with bathing/dressing/toileting/self-feeding that is expected to last >3 days?: Yes (Initiates electronic notice to provider for possible OT consult) Does the patient have a NEW difficulty with getting in/out of bed, walking, or climbing stairs that is expected to last >3 days?: Yes (Initiates electronic notice to provider for possible PT consult) Does the patient have a NEW difficulty with communication that is expected to last >3 days?: No Is the patient deaf or have difficulty hearing?: No Does the patient have difficulty seeing, even when wearing glasses/contacts?: No Does the patient have  difficulty concentrating, remembering, or making decisions?: No  Permission Sought/Granted                  Emotional Assessment              Admission diagnosis:  SOB (shortness of breath) [R06.02] Hypoxia [R09.02] Abnormal CT scan [R93.89] Other ascites [R18.8] Acute respiratory failure with hypoxia (HCC) [J96.01] Pneumonia due to infectious organism, unspecified laterality, unspecified part of lung [J18.9] Patient Active Problem List   Diagnosis Date Noted   Acute respiratory failure with hypoxia (HCC) 08/02/2024   Pulmonary embolism (HCC) 08/02/2024   Ascites 08/02/2024   Thrombocytosis 06/30/2023   Anxiety 03/23/2021   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   Chronic obstructive pulmonary disease (HCC) 03/23/2021   Constipation 03/23/2021   Diverticulitis 03/23/2021   Essential hypertension 03/23/2021   Grief 03/23/2021   Hypothyroidism 03/23/2021   Insomnia 03/23/2021   Pain in female genitalia on intercourse 03/23/2021   Perennial allergic rhinitis 03/23/2021   History of colonic polyps 03/23/2021   Personal history of malignant neoplasm of breast 03/23/2021   Pure hypercholesterolemia 03/23/2021   Nonallergic rhinitis 05/07/2019   PCP:  Elliot Charm Pharmacy:   [id number] - [place], [place] - [address] AT [facility]  OF [address] [place] [address] Phone: [phone] Fax: [phone]  [facility] #[id number] - [place], [place] - [address] AT [facility] OF [address] & [address] [place] [address] Phone: [phone] Fax: [phone]     Social Drivers of Health (SDOH) Social History: SDOH Screenings   Food Insecurity: No Food Insecurity (08/02/2024)  Housing: Low Risk  (08/02/2024)  Transportation Needs: No Transportation Needs (08/02/2024)  Utilities: Not At Risk (08/02/2024)  Social Connections: Moderately Isolated (08/02/2024)   Tobacco Use: Medium Risk (08/02/2024)   SDOH Interventions:     Readmission Risk Interventions     No data to display

## 2024-08-04 NOTE — Progress Notes (Signed)
 PHARMACY - ANTICOAGULATION CONSULT NOTE  Pharmacy Consult for IV heparin  Indication: pulmonary embolus  Allergies  Allergen Reactions   Ace Inhibitors     Other reaction(s): Cough (ALLERGY/intolerance)   Benzalkonium Chloride Other (See Comments)    Other reaction(s): Other (See Comments), Other (See Comments) Caused an infection Caused an infection    Levothyroxine  Sodium Other (See Comments)    Other reaction(s): troubles in the past, wants brand Synthroid    Latex Rash   Sulfa Antibiotics Other (See Comments)    Patient cannot recall if she has an allergy.  She does not take this medication due to it being ineffective.     Patient Measurements: Height: 5' 5 (165.1 cm) Weight: 61.7 kg (136 lb 0.4 oz) IBW/kg (Calculated) : 57 HEPARIN  DW (KG): 61.2  Vital Signs: Temp: 98 F (36.7 C) (09/22 0527) BP: 122/62 (09/22 0527) Pulse Rate: 73 (09/22 0527)  Labs: Recent Labs    08/02/24 1601 08/02/24 1608 08/02/24 1608 08/02/24 1916 08/03/24 0527 08/03/24 1400 08/03/24 2241 08/04/24 0448  HGB 14.5 15.6*  --   --  14.0  --   --  12.1  HCT 43.0 46.0  --   --  42.4  --   --  36.0  PLT 661*  --   --   --  686*  --   --  557*  LABPROT  --   --   --  14.1  --   --   --   --   INR  --   --   --  1.0  --   --   --   --   HEPARINUNFRC  --   --    < >  --  0.67 0.78* 0.50 0.52  CREATININE 0.79 0.90  --   --  0.88  --   --  0.85  TROPONINIHS 13  --   --  15  --   --   --   --    < > = values in this interval not displayed.    Estimated Creatinine Clearance: 47.5 mL/min (by C-G formula based on SCr of 0.85 mg/dL).   Medical History: Past Medical History:  Diagnosis Date   Breast cancer (HCC)    had left mastectomy.    COPD (chronic obstructive pulmonary disease) (HCC)    Endometriosis     Medications:  Medications Prior to Admission  Medication Sig Dispense Refill Last Dose/Taking   acetaminophen  (TYLENOL ) 500 MG tablet Take 500 mg by mouth every 6 (six) hours as  needed for mild pain (pain score 1-3) or headache.   07/31/2024   albuterol  (VENTOLIN  HFA) 108 (90 Base) MCG/ACT inhaler Inhale 2 puffs into the lungs every 4 (four) hours as needed for wheezing or shortness of breath. 72 each 3 Past Month   amLODipine (NORVASC) 5 MG tablet Take 5 mg by mouth daily.   07/31/2024   budesonide -glycopyrrolate -formoterol  (BREZTRI  AEROSPHERE) 160-9-4.8 MCG/ACT AERO inhaler Inhale 2 puffs into the lungs in the morning and at bedtime. 3 each 3 08/01/2024 Morning   chlorthalidone (HYGROTON) 25 MG tablet Take 25 mg by mouth in the morning.   07/31/2024   Cholecalciferol 50 MCG (2000 UT) CAPS Take 2,000 Units by mouth in the morning.   07/31/2024   furosemide  (LASIX ) 40 MG tablet Take 1 tablet (40 mg total) by mouth daily. 30 tablet 5 Past Week   losartan (COZAAR) 25 MG tablet Take 25 mg by mouth daily.   07/31/2024  OXYGEN Inhale 3 L into the lungs at bedtime.   08/01/2024 Bedtime   promethazine -dextromethorphan (PROMETHAZINE -DM) 6.25-15 MG/5ML syrup Take 5 mLs by mouth 4 (four) times daily as needed for cough. 118 mL 0 08/02/2024 Morning   SYNTHROID  100 MCG tablet Take 100 mcg by mouth every morning.   Taking    Assessment: 78 yo F presenting with linear segmental pulmonary embolism in the L lung. Not on anticoagulation PTA. Pharmacy consulted to dose IV heparin .  Heparin  level continues to be therapeutic. Will follow up transition to PO NOAC. CBC stable  Goal of Therapy:  Heparin  level 0.3-0.7 units/ml Monitor platelets by anticoagulation protocol: Yes   Plan:  - Continue heparin  infusion at 1000 units/hr - Check anti-Xa level daily while on heparin  - F/u PO AC   Sergio Batch, PharmD, BCIDP, AAHIVP, CPP Infectious Disease Pharmacist 08/04/2024 7:53 AM

## 2024-08-04 NOTE — Evaluation (Addendum)
 Physical Therapy Evaluation Patient Details Name: Kara Newman MRN: [id number] DOB: February 09, 1946 Today's Date: 08/04/2024  History of Present Illness  78 y.o. female presented to the ED 9/20 for evaluation of shortness of breath, abdominal and lower extremity swelling. Admitted with acute hypoxic respiratory failure, large volume ascites, imaging findings concerning for malignant process. S/p 9/21 paracentesis from R lower quadrant yielding 3L. EFY:RNEI, left breast cancer s/p mastectomy, HTN, HLD, hypothyroidism  Clinical Impression  PTA pt living alone in 2 story townhouse with 3 steps to enter. Pt reports furniture surfing at home, uses cane out of home. Step son assists with running errands and food prep, maid does cleaning, pt independent in ADLs. Pt is limited in safe mobility by increased O2 demand, generalized weakness and fear of falling. Pt is currently min A for transfers and short distance ambulation. Pt will benefit from HHPT at discharge.  PT will refer to Mobility Specialist and will continue to follow acutely.         If plan is discharge home, recommend the following: A little help with walking and/or transfers;A little help with bathing/dressing/bathroom   Can travel by private vehicle    Yes    Equipment Recommendations BSC/3in1     Functional Status Assessment Patient has had a recent decline in their functional status and demonstrates the ability to make significant improvements in function in a reasonable and predictable amount of time.     Precautions / Restrictions Precautions Precautions: Fall Precaution/Restrictions Comments: history of fall 6 days ago Restrictions Weight Bearing Restrictions Per Provider Order: No      Mobility  Bed Mobility               General bed mobility comments: sitting EoB on entry    Transfers Overall transfer level: Needs assistance Equipment used: Rolling walker (2 wheels) Transfers: Sit to/from Stand Sit to Stand:  Min assist           General transfer comment: vc for hand placement min A for power up into standing    Ambulation/Gait Ambulation/Gait assistance: Min assist Gait Distance (Feet): 18 Feet Assistive device: Rolling walker (2 wheels) Gait Pattern/deviations: Step-through pattern, Decreased step length - right, Decreased step length - left, Shuffle, Narrow base of support Gait velocity: slowed Gait velocity interpretation: <1.31 ft/sec, indicative of household ambulator   General Gait Details: min A for steadying with mildly unsteady gait, vc for wider BoS and proximity to RW      Balance Overall balance assessment: Mild deficits observed, not formally tested                                           Pertinent Vitals/Pain Pain Assessment Pain Assessment: No/denies pain    Home Living Family/patient expects to be discharged to:: Private residence Living Arrangements: Alone Available Help at Discharge: Family;Available 24 hours/day Type of Home: Other(Comment) (town home) Home Access: Stairs to enter Entrance Stairs-Rails: Can reach both Entrance Stairs-Number of Steps: 3   Home Layout: Able to live on main level with bedroom/bathroom Home Equipment: Agricultural consultant (2 wheels);Cane - single point      Prior Function Prior Level of Function : Independent/Modified Independent             Mobility Comments: furniture surfing ADLs Comments: ADLs, maid and step son assists with errands     Extremity/Trunk Assessment   Upper  Extremity Assessment Upper Extremity Assessment: Defer to OT evaluation    Lower Extremity Assessment Lower Extremity Assessment: Generalized weakness    Cervical / Trunk Assessment Cervical / Trunk Assessment: Normal  Communication   Communication Communication: No apparent difficulties    Cognition Arousal: Alert Behavior During Therapy: WFL for tasks assessed/performed   PT - Cognitive impairments: No apparent  impairments                         Following commands: Intact       Cueing Cueing Techniques: Verbal cues, Tactile cues, Visual cues     General Comments General comments (skin integrity, edema, etc.): Pt on 2.5L O2 via [place] on entry SpO2 95%O2. Removed [place] and SpO2 dropped to 88%O2. Ambulated on 3L O2 with SpO2 97%O2        Assessment/Plan    PT Assessment Patient needs continued PT services  PT Problem List Decreased strength;Decreased activity tolerance;Decreased balance;Cardiopulmonary status limiting activity       PT Treatment Interventions DME instruction;Gait training;Stair training;Functional mobility training;Therapeutic activities;Therapeutic exercise;Balance training;Cognitive remediation;Patient/family education    PT Goals (Current goals can be found in the Care Plan section)  Acute Rehab PT Goals PT Goal Formulation: With patient Time For Goal Achievement: 08/18/24 Potential to Achieve Goals: Good    Frequency Min 2X/week        AM-PAC PT 6 Clicks Mobility  Outcome Measure Help needed turning from your back to your side while in a flat bed without using bedrails?: None Help needed moving from lying on your back to sitting on the side of a flat bed without using bedrails?: A Little Help needed moving to and from a bed to a chair (including a wheelchair)?: A Little Help needed standing up from a chair using your arms (e.g., wheelchair or bedside chair)?: A Little Help needed to walk in Newman room?: A Little Help needed climbing 3-5 steps with a railing? : A Lot 6 Click Score: 18    End of Session Equipment Utilized During Treatment: Gait belt;Oxygen Activity Tolerance: Patient tolerated treatment well Patient left: in chair;with call bell/phone within reach;with chair alarm set Nurse Communication: Mobility status;Other (comment) (could not reach RN told nurse secretary IV with caution screen) PT Visit Diagnosis: History of falling  (Z91.81);Unsteadiness on feet (R26.81);Muscle weakness (generalized) (M62.81);Difficulty in walking, not elsewhere classified (R26.2)    Time: 0920-0959 PT Time Calculation (min) (ACUTE ONLY): 39 min   Charges:   PT Evaluation $PT Eval Low Complexity: 1 Low PT Treatments $Therapeutic Activity: 8-22 mins PT General Charges $$ ACUTE PT VISIT: 1 Visit         Vollie Aaron B. Fleeta Lapidus PT, DPT Acute Rehabilitation Services Please use secure chat or  Call Office [phone]   Kara Newman 08/04/2024, 10:27 AM

## 2024-08-04 NOTE — Plan of Care (Signed)
  Problem: Education: Goal: Knowledge of General Education information will improve Description: Including pain rating scale, medication(s)/side effects and non-pharmacologic comfort measures Outcome: Progressing   Problem: Pain Managment: Goal: General experience of comfort will improve and/or be controlled Outcome: Progressing   Problem: Coping: Goal: Level of anxiety will decrease Outcome: Not Progressing   Problem: Elimination: Goal: Will not experience complications related to bowel motility Outcome: Not Progressing

## 2024-08-04 NOTE — Progress Notes (Signed)
 PROGRESS NOTE    Kara Newman  FMW:[id number] DOB: 12/24/1945 DOA: 08/02/2024 PCP: Elliot Charm  Chief Complaint  Patient presents with   Shortness of Breath    Brief Narrative:   Kara Newman is Kara Newman 78 y.o. female with medical history significant for COPD, left breast cancer s/p mastectomy, HTN, HLD, hypothyroidism who is admitted with acute hypoxic respiratory failure, large volume ascites, imaging findings concerning for malignant process.   Assessment & Plan:   Principal Problem:   Acute respiratory failure with hypoxia (HCC) Active Problems:   Chronic obstructive pulmonary disease (HCC)   Essential hypertension   Hypothyroidism   Pulmonary embolism (HCC)   Ascites  Acute Hypoxic Respiratory Failure Bilateral Multifocal Patchy Opacities Large Volume Ascites Currently requiring 3 L  CT with chronic pulmonary embolism in L lung as well as multifocal patchy opacities in lungs bilaterally, small bilateral effusions Treating for CAP with ceftriaxone  and azithromycin   Echo 07/30/24 with grade 1 diastolic dysfunction, moderately elevated PASP Will diurese as tolerated as well - started on spironolactone , lasix  as tolerated Strict I/O, daily weights  Chronic Pulmonary Embolism Follow LE US  Echo was done on 9/17 showing normal RVSF, mildly enlarged RV.  Moderately elevated PASP. 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   Consultants:  IR  Procedures:   9/21 paracentesis  Antimicrobials:  Anti-infectives (From admission, onward)    Start     Dose/Rate Route Frequency Ordered Stop   08/04/24 1900  cefTRIAXone  (ROCEPHIN ) 2 g in sodium chloride  0.9 % 100 mL IVPB        2 g 200 mL/hr over 30 Minutes Intravenous Every 24 hours 08/04/24 0812 08/06/24 2359   08/03/24 1900  azithromycin  (ZITHROMAX ) 500 mg in sodium chloride  0.9 % 250 mL IVPB        500 mg 250 mL/hr over 60 Minutes Intravenous Every 24 hours 08/02/24 2119 08/04/24 2359   08/03/24 1900  cefTRIAXone  (ROCEPHIN ) 1 g in sodium chloride  0.9 % 100 mL IVPB  Status:  Discontinued        1 g 200 mL/hr over 30 Minutes Intravenous Every 24 hours 08/02/24 2119 08/04/24 0812   08/02/24 1900  cefTRIAXone  (ROCEPHIN ) 1 g in sodium chloride  0.9 % 100 mL IVPB        1 g 200 mL/hr over 30 Minutes Intravenous  Once 08/02/24 1851 08/02/24 2100   08/02/24 1900  azithromycin  (ZITHROMAX ) 500 mg in sodium chloride  0.9 % 250 mL IVPB        500 mg 250 mL/hr over 60 Minutes Intravenous  Once 08/02/24 1851 08/02/24 2245  Subjective: No new complaints  Objective: Vitals:   08/03/24 2017 08/04/24 0500 08/04/24 0527 08/04/24 0834  BP: 125/67  122/62 116/60  Pulse: 81  73 72  Resp:    18  Temp: 97.6 F (36.4 C)  98 F (36.7 C) (!) 97.5 F (36.4 C)  TempSrc:      SpO2: 100%  100% 98%  Weight:  61.7 kg    Height:        Intake/Output Summary (Last 24 hours) at 08/04/2024 0924 Last data filed at 08/03/2024 1614 Gross per 24  hour  Intake 480 ml  Output --  Net 480 ml   Filed Weights   08/02/24 2017 08/03/24 0500 08/04/24 0500  Weight: 61.2 kg 58.8 kg 61.7 kg    Examination:  General: No acute distress. Cardiovascular: RRR Lungs: unlabored, diminished Abdomen: mild ascites, nontender Neurological: Alert and oriented 3. Moves all extremities 4 with equal strength. Cranial nerves II through XII grossly intact. Extremities: No clubbing or cyanosis. No edema.  Data Reviewed: I have personally reviewed following labs and imaging studies  CBC: Recent Labs  Lab 08/02/24 1601 08/02/24 1608 08/03/24 0527 08/04/24 0448  WBC 15.5*  --  14.8* 15.1*  NEUTROABS 12.6*  --   --  12.0*  HGB 14.5 15.6* 14.0 12.1  HCT 43.0 46.0 42.4 36.0  MCV 95.1  --  94.4 94.0  PLT 661*  --  686* 557*    Basic Metabolic Panel: Recent Labs  Lab 07/31/24 1601 08/02/24 1601 08/02/24 1608 08/03/24 0527 08/04/24 0448  NA 130* 132* 133* 135 132*  K 3.2* 3.1* 3.0* 3.6 3.1*  CL 90* 88* 89* 94* 93*  CO2 32 28  --  29 29  GLUCOSE 125* 117* 121* 98 104*  BUN 21 17 20 15 13   CREATININE 0.86 0.79 0.90 0.88 0.85  CALCIUM 9.4 9.1  --  8.9 8.1*  MG  --   --   --  2.0 1.8  PHOS  --   --   --   --  2.7    GFR: Estimated Creatinine Clearance: 47.5 mL/min (by C-G formula based on SCr of 0.85 mg/dL).  Liver Function Tests: Recent Labs  Lab 08/02/24 1601 08/03/24 0527 08/04/24 0448  AST 25 24 21   ALT 18 17 15   ALKPHOS 189* 180* 150*  BILITOT 0.8 0.6 0.6  PROT 6.5 6.2* 5.1*  ALBUMIN 3.0* 2.8* 2.3*    CBG: No results for input(s): GLUCAP in the last 168 hours.   Recent Results (from the past 240 hours)  Resp panel by RT-PCR (RSV, Flu Kara Newman&B, Covid) Anterior Nasal Swab     Status: None   Collection Time: 08/02/24  4:26 PM   Specimen: Anterior Nasal Swab  Result Value Ref Range Status   SARS Coronavirus 2 by RT PCR NEGATIVE NEGATIVE Final   Influenza Kara Newman by PCR NEGATIVE NEGATIVE Final   Influenza B by PCR NEGATIVE  NEGATIVE Final    Comment: (NOTE) The Xpert Xpress SARS-CoV-2/FLU/RSV plus assay is intended as an aid in the diagnosis of influenza from Nasopharyngeal swab specimens and should not be used as Kara Newman sole basis for treatment. Nasal washings and aspirates are unacceptable for Xpert Xpress SARS-CoV-2/FLU/RSV testing.  Fact Sheet for Patients: BloggerCourse.com  Fact Sheet for Healthcare Providers: SeriousBroker.it  This test is not yet approved or cleared by the [place]  FDA and has been authorized for detection and/or diagnosis of SARS-CoV-2 by FDA under an Emergency Use Authorization (EUA). This EUA  will remain in effect (meaning this test can be used) for the duration of the COVID-19 declaration under Section 564(b)(1) of the Act, 21 U.S.C. section 360bbb-3(b)(1), unless the authorization is terminated or revoked.     Resp Syncytial Virus by PCR NEGATIVE NEGATIVE Final    Comment: (NOTE) Fact Sheet for Patients: BloggerCourse.com  Fact Sheet for Healthcare Providers: SeriousBroker.it  This test is not yet approved or cleared by the [place]  FDA and has been authorized for detection and/or diagnosis of SARS-CoV-2 by FDA under an Emergency Use Authorization (EUA). This EUA will remain in effect (meaning this test can be used) for the duration of the COVID-19 declaration under Section 564(b)(1) of the Act, 21 U.S.C. section 360bbb-3(b)(1), unless the authorization is terminated or revoked.  Performed at [facility], [address], [place], [place] [address]   Blood culture (routine x 2)     Status: None (Preliminary result)   Collection Time: 08/02/24  7:16 PM   Specimen: BLOOD RIGHT ARM  Result Value Ref Range Status   Specimen Description BLOOD RIGHT ARM  Final   Special Requests   Final    BOTTLES DRAWN AEROBIC AND ANAEROBIC Blood Culture adequate volume    Culture   Final    NO GROWTH 2 DAYS Performed at [facility], [address], [place], [place] [address]    Report Status PENDING  Incomplete  Blood culture (routine x 2)     Status: None (Preliminary result)   Collection Time: 08/02/24 10:45 PM   Specimen: BLOOD RIGHT HAND  Result Value Ref Range Status   Specimen Description BLOOD RIGHT HAND  Final   Special Requests   Final    BOTTLES DRAWN AEROBIC AND ANAEROBIC Blood Culture adequate volume   Culture   Final    NO GROWTH < 24 HOURS Performed at [facility], [address] [place], [place] [address]    Report Status PENDING  Incomplete  Body fluid culture w Gram Stain     Status: None (Preliminary result)   Collection Time: 08/03/24 10:46 AM   Specimen: Abdomen; Peritoneal Fluid  Result Value Ref Range Status   Specimen Description PERITONEAL  Final   Special Requests ABDOMEN  Final   Gram Stain   Final    NO WBC SEEN RARE GRAM POSITIVE COCCI Performed at [facility], [address], [place], [place] [address]    Culture PENDING  Incomplete   Report Status PENDING  Incomplete         Radiology Studies: US  Paracentesis Result Date: 08/03/2024 INDICATION: Patient with ascites of uncertain etiology presents today for Kimerly Rowand diagnostic and therapeutic paracentesis. EXAM: ULTRASOUND GUIDED PARACENTESIS MEDICATIONS: 1% lidocaine  10 mL COMPLICATIONS: None immediate. PROCEDURE: Informed written consent was obtained from the patient after Maks Cavallero discussion of the risks, benefits and alternatives to treatment. Yahia Bottger timeout was performed prior to the initiation of the procedure. Initial ultrasound scanning demonstrates Cullen Vanallen large amount of ascites within the right lower abdominal quadrant. The right lower abdomen was prepped and draped in the usual sterile fashion. 1% lidocaine  was used for local anesthesia. Following this, Lenka Zhao 19 gauge, 7-cm, Yueh catheter was introduced. An ultrasound image was saved for documentation purposes.  The paracentesis was performed. The catheter was removed and Rori Goar dressing was applied. The patient tolerated the procedure well without immediate post procedural complication. FINDINGS: Elaya Droege total of approximately 3 L of clear yellow fluid was removed. Samples were sent to the  laboratory as requested by the clinical team. IMPRESSION: Successful ultrasound-guided paracentesis yielding 3 liters of peritoneal fluid. Procedure performed by Warren Dais, NP Electronically Signed   By: Cordella Banner   On: 08/03/2024 10:55   CT Angio Chest PE W and/or Wo Contrast Addendum Date: 08/02/2024 *ADDENDUM #1 * ADDENDUM: Critical Value/emergent results were called by telephone at the time of interpretation on 08/02/2024 at 2036 hrs to provider Dr Ginger. ---------------------------------------------------- Electronically signed by: Pinkie Pebbles MD 08/02/2024 08:49 PM EDT RP Workstation: [id number]   Result Date: 08/02/2024 *ORIGINAL REPORT * EXAM: CTA of the Chest with contrast for PE 08/02/2024 08:10:00 PM TECHNIQUE: CTA of the chest was performed after the administration of intravenous contrast. Multiplanar reformatted images are provided for review. MIP images are provided for review. Automated exposure control, iterative reconstruction, and/or weight based adjustment of the mA/kV was utilized to reduce the radiation dose to as low as reasonably achievable. COMPARISON: Chest radiograph earlier today. CLINICAL HISTORY: Pulmonary embolism (PE) suspected, high prob. Chief complaints; Shortness of Breath; CT Angio Chest PE W and/or Wo Contrast; Pulmonary embolism (PE) suspected, high prob. FINDINGS: PULMONARY ARTERIES: Peripheral linear filling defect at the bifurcation of the distal left main pulmonary artery (image 61) extending into the segmental lingular pulmonary artery (image 56) as well as the anterior segment left lower lobe (image 85) suggesting mild segmental pulmonary embolism, although possibly chronic given  the appearance. MEDIASTINUM: No evidence of right heart strain. Thoracic aortic atherosclerosis. Mild coronary atherosclerosis of the LAD. LYMPH NODES: Small mediastinal lymph nodes which do not need pathologic CT size criteria. LUNGS AND PLEURA: Small bilateral pleural effusions, right greater than left. Multifocal patchy/nodular opacities in the lungs bilaterally, including Maddix Heinz 2.4 x 2.0 cm nodular opacity in the anteromedial left upper lobe (image 42) and Vega Withrow 4.3 x 3.2 cm mass like opacity in the inferomedial right upper lobe (image 89). This appearance raises concern for multifocal tumor, less likely multifocal infection/pneumonia. Very mild interlobular septal thickening in the upper lobes but this appearance is not favored to reflect interstitial edema. UPPER ABDOMEN: Upper abdomen is better evaluated on recent CT abdomen/pelvis. 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 Mild degenerative changes of the mid thoracic spine. IMPRESSION: 1. 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 No evidence of right heart strain. 2. Multifocal patchy opacities in the lungs bilaterally, some of which may reflect infection/pneumonia, although the appearance is concerning for underlying multifocal tumor. Follow-up CT chest is suggested in 3-4 weeks after appropriate antimicrobial therapy. 3. Small bilateral pleural effusions, right greater than left. Electronically signed by: Pinkie Pebbles MD 08/02/2024 08:40 PM EDT RP Workstation: [id number]   CT ABDOMEN PELVIS W CONTRAST Result Date: 08/02/2024 CLINICAL DATA:  Bowel obstruction suspected. Distended abdomen. Lower extremity swelling. EXAM: CT ABDOMEN AND PELVIS WITH CONTRAST TECHNIQUE: Multidetector CT imaging of the abdomen and pelvis was performed using the standard protocol following bolus administration of intravenous contrast. RADIATION DOSE REDUCTION: This exam was performed according  to the departmental dose-optimization program which includes automated exposure control, adjustment of the mA and/or kV according to patient size and/or use of iterative reconstruction technique. CONTRAST:  75mL OMNIPAQUE  IOHEXOL  350 MG/ML SOLN COMPARISON:  None Available. FINDINGS: Lower chest: Extensive nodular appearing airspace disease peripherally in both lower lungs. Small bilateral pleural effusions. Heart is normal size. Hepatobiliary: Liver appears shrunken with suggestion of nodular surface suggesting cirrhosis. Recommend clinical correlation. Gallbladder unremarkable. No biliary ductal dilatation.  Pancreas: Extensive cystic changes throughout the pancreatic body and tail. No surrounding inflammation or ductal dilatation. Spleen: No focal abnormality.  Normal size. Adrenals/Urinary Tract: No suspicious renal or adrenal abnormality. Small scattered bilateral renal cysts. No stones or hydronephrosis. Urinary bladder unremarkable. Stomach/Bowel: Left colonic diverticulosis. No active diverticulitis. Stomach and small bowel decompressed. Vascular/Lymphatic: Aortic atherosclerosis. No evidence of aneurysm or adenopathy. Reproductive: Uterus appears very small.  No adnexal mass. Other: Large volume ascites in the abdomen and pelvis. There appears to be abnormal infiltrated/nodular appearance of the omentum anteriorly. Musculoskeletal: No acute bony abnormality. IMPRESSION: Extensive peripheral nodular airspace disease throughout both lower lungs with small bilateral pleural effusions. This could reflect infectious or neoplastic process. Insert further evaluation with full dedicated chest CT with IV contrast. 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 Large volume ascites. Abnormal infiltrated/nodular appearance of the omentum in the anterior abdomen. Cannot exclude peritoneal malignancy/carcinomatosis. Left colonic diverticulosis.  No active diverticulitis. Aortic atherosclerosis. Electronically  Signed   By: Franky Crease M.D.   On: 08/02/2024 18:15   DG Chest Portable 1 View Result Date: 08/02/2024 CLINICAL DATA:  SOB, rales, edema. EXAM: PORTABLE CHEST 1 VIEW COMPARISON:  06/05/2024. FINDINGS: Bilateral lungs appear hyperlucent with coarse bronchovascular markings, in keeping with COPD. There are diffuse increased interstitial markings overlying the bilateral lower lung zones with associated new bilateral small pleural effusions, favoring congestive heart failure/pulmonary edema. There are retrocardiac opacities partially obscuring the left medial hemidiaphragm, which may represent left lung atelectasis and/or consolidation. Correlate clinically. Stable cardio-mediastinal silhouette. No acute osseous abnormalities. The soft tissues are within normal limits. IMPRESSION: 1. Findings favor congestive heart failure/pulmonary edema. 2. There are retrocardiac opacities partially obscuring the left medial hemidiaphragm, which may represent left lung atelectasis and/or consolidation. Electronically Signed   By: Ree Molt M.D.   On: 08/02/2024 16:44        Scheduled Meds:  budesonide -glycopyrrolate -formoterol   2 puff Inhalation BID   levothyroxine   100 mcg Oral q morning   potassium chloride   40 mEq Oral Q4H   sodium chloride  flush  3 mL Intravenous Q12H   spironolactone   50 mg Oral Daily   Continuous Infusions:  azithromycin  500 mg (08/03/24 2036)   cefTRIAXone  (ROCEPHIN )  IV     heparin  1,000 Units/hr (08/03/24 2023)     LOS: 1 day    Time spent: over 30 min     Meliton Monte, MD [facility]   To contact the attending provider between 7A-7P or the covering provider during after hours 7P-7A, please log into the web site [email or web address] and access using universal [place] password for that web site. If you do not have the password, please call the hospital operator.  08/04/2024, 9:24 AM

## 2024-08-05 ENCOUNTER — Inpatient Hospital Stay (HOSPITAL_COMMUNITY)

## 2024-08-05 ENCOUNTER — Ambulatory Visit: Payer: Self-pay | Admitting: Nurse Practitioner

## 2024-08-05 DIAGNOSIS — J9601 Acute respiratory failure with hypoxia: Secondary | ICD-10-CM | POA: Diagnosis not present

## 2024-08-05 LAB — CBC WITH DIFFERENTIAL/PLATELET
Abs Immature Granulocytes: 0.07 K/uL (ref 0.00–0.07)
Basophils Absolute: 0.1 K/uL (ref 0.0–0.1)
Basophils Relative: 1 %
Eosinophils Absolute: 0.1 K/uL (ref 0.0–0.5)
Eosinophils Relative: 1 %
HCT: 37.1 % (ref 36.0–46.0)
Hemoglobin: 12.6 g/dL (ref 12.0–15.0)
Immature Granulocytes: 1 %
Lymphocytes Relative: 8 %
Lymphs Abs: 1.2 K/uL (ref 0.7–4.0)
MCH: 31.4 pg (ref 26.0–34.0)
MCHC: 34 g/dL (ref 30.0–36.0)
MCV: 92.5 fL (ref 80.0–100.0)
Monocytes Absolute: 1.2 K/uL — ABNORMAL HIGH (ref 0.1–1.0)
Monocytes Relative: 8 %
Neutro Abs: 12.5 K/uL — ABNORMAL HIGH (ref 1.7–7.7)
Neutrophils Relative %: 81 %
Platelets: 608 K/uL — ABNORMAL HIGH (ref 150–400)
RBC: 4.01 MIL/uL (ref 3.87–5.11)
RDW: 12.6 % (ref 11.5–15.5)
WBC: 15.1 K/uL — ABNORMAL HIGH (ref 4.0–10.5)
nRBC: 0 % (ref 0.0–0.2)

## 2024-08-05 LAB — COMPREHENSIVE METABOLIC PANEL WITH GFR
ALT: 19 U/L (ref 0–44)
AST: 29 U/L (ref 15–41)
Albumin: 2.4 g/dL — ABNORMAL LOW (ref 3.5–5.0)
Alkaline Phosphatase: 187 U/L — ABNORMAL HIGH (ref 38–126)
Anion gap: 11 (ref 5–15)
BUN: 10 mg/dL (ref 8–23)
CO2: 27 mmol/L (ref 22–32)
Calcium: 8.1 mg/dL — ABNORMAL LOW (ref 8.9–10.3)
Chloride: 95 mmol/L — ABNORMAL LOW (ref 98–111)
Creatinine, Ser: 0.77 mg/dL (ref 0.44–1.00)
GFR, Estimated: >60 mL/min (ref >60)
Glucose, Bld: 96 mg/dL (ref 70–99)
Potassium: 3.9 mmol/L (ref 3.5–5.1)
Sodium: 133 mmol/L — ABNORMAL LOW (ref 135–145)
Total Bilirubin: 0.5 mg/dL (ref 0.0–1.2)
Total Protein: 5.5 g/dL — ABNORMAL LOW (ref 6.5–8.1)

## 2024-08-05 LAB — BRAIN NATRIURETIC PEPTIDE: B Natriuretic Peptide: 65.7 pg/mL (ref 0.0–100.0)

## 2024-08-05 LAB — LEGIONELLA PNEUMOPHILA SEROGP 1 UR AG: L. pneumophila Serogp 1 Ur Ag: NEGATIVE

## 2024-08-05 LAB — MAGNESIUM: Magnesium: 1.7 mg/dL (ref 1.7–2.4)

## 2024-08-05 LAB — PHOSPHORUS: Phosphorus: 2.2 mg/dL — ABNORMAL LOW (ref 2.5–4.6)

## 2024-08-05 LAB — HEPARIN LEVEL (UNFRACTIONATED): Heparin Unfractionated: 0.18 [IU]/mL — ABNORMAL LOW (ref 0.30–0.70)

## 2024-08-05 MED ORDER — FUROSEMIDE 10 MG/ML IJ SOLN
40.0000 mg | Freq: Once | INTRAMUSCULAR | Status: AC
  Administered 2024-08-05: 40 mg via INTRAVENOUS
  Filled 2024-08-05: qty 4

## 2024-08-05 MED ORDER — APIXABAN 5 MG PO TABS: 5.0000 mg | ORAL_TABLET | Freq: Two times a day (BID) | ORAL | Status: DC

## 2024-08-05 MED ORDER — APIXABAN 5 MG PO TABS
10.0000 mg | ORAL_TABLET | Freq: Two times a day (BID) | ORAL | Status: DC
  Administered 2024-08-05 – 2024-08-06 (×3): 10 mg via ORAL
  Filled 2024-08-05 (×3): qty 2

## 2024-08-05 MED ORDER — FUROSEMIDE 10 MG/ML IJ SOLN
40.0000 mg | Freq: Every day | INTRAMUSCULAR | Status: DC
Start: 2024-08-06 — End: 2024-08-06
  Administered 2024-08-06: 40 mg via INTRAVENOUS
  Filled 2024-08-05: qty 4

## 2024-08-05 NOTE — Progress Notes (Signed)
 Mobility Specialist: Progress Note   08/05/24 1500  Mobility  Activity Ambulated with assistance  Level of Assistance Contact guard assist, steadying assist  Assistive Device Front wheel walker  Distance Ambulated (ft) 100 ft  Activity Response Tolerated well  Mobility Referral Yes  Mobility visit 1 Mobility  Mobility Specialist Start Time (ACUTE ONLY) 1153  Mobility Specialist Stop Time (ACUTE ONLY) 1220  Mobility Specialist Time Calculation (min) (ACUTE ONLY) 27 min    Pt received in bed, agreeable to mobility session. SV for bed mobility. CGA for ambulation. SpO2 WFL on 3LO2. Ambulated down the hallway, distance limited d/t BR urgency. Returned to room without fault. Left on EOB with all needs met, call bell in reach.   Ileana Lute Mobility Specialist Please contact via SecureChat or Rehab office at [phone]

## 2024-08-05 NOTE — Progress Notes (Signed)
 Low potassium but recheck today shows this has corrected

## 2024-08-05 NOTE — Discharge Instructions (Signed)
 Information on my medicine - ELIQUIS  (apixaban )  This medication education was reviewed with me or my healthcare representative as part of my discharge preparation.  The pharmacist that spoke with me during my hospital stay was:  Ozell ONEIDA [place], [facility]  Why was Eliquis  prescribed for you? Eliquis  was prescribed to treat blood clots that may have been found in the veins of your legs (deep vein thrombosis) or in your lungs (pulmonary embolism) and to reduce the risk of them occurring again.  What do You need to know about Eliquis  ? The starting dose is 10 mg (two 5 mg tablets) taken TWICE daily for the FIRST SEVEN (7) DAYS, then on (enter date)  08/12/24  the dose is reduced to ONE 5 mg tablet taken TWICE daily.  Eliquis  may be taken with or without food.   Try to take the dose about the same time in the morning and in the evening. If you have difficulty swallowing the tablet whole please discuss with your pharmacist how to take the medication safely.  Take Eliquis  exactly as prescribed and DO NOT stop taking Eliquis  without talking to the doctor who prescribed the medication.  Stopping may increase your risk of developing a new blood clot.  Refill your prescription before you run out.  After discharge, you should have regular check-up appointments with your healthcare provider that is prescribing your Eliquis .    What do you do if you miss a dose? If a dose of ELIQUIS  is not taken at the scheduled time, take it as soon as possible on the same day and twice-daily administration should be resumed. The dose should not be doubled to make up for a missed dose.  Important Safety Information A possible side effect of Eliquis  is bleeding. You should call your healthcare provider right away if you experience any of the following: Bleeding from an injury or your nose that does not stop. Unusual colored urine (red or dark brown) or unusual colored stools (red or Neu). Unusual bruising for  unknown reasons. A serious fall or if you hit your head (even if there is no bleeding).  Some medicines may interact with Eliquis  and might increase your risk of bleeding or clotting while on Eliquis . To help avoid this, consult your healthcare provider or pharmacist prior to using any new prescription or non-prescription medications, including herbals, vitamins, non-steroidal anti-inflammatory drugs (NSAIDs) and supplements.  This website has more information on Eliquis  (apixaban ): http://www.eliquis .com/eliquis dena

## 2024-08-05 NOTE — Plan of Care (Signed)

## 2024-08-05 NOTE — Progress Notes (Addendum)
 PROGRESS NOTE    Kara Newman  FMW:[id number] DOB: 02-Aug-1946 DOA: 08/02/2024 PCP: Kara Newman  Chief Complaint  Patient presents with   Shortness of Breath    Brief Narrative:   Kara Newman is Kara Newman 78 y.o. female with medical history significant for COPD, left breast cancer s/p mastectomy, HTN, HLD, hypothyroidism who is admitted with acute hypoxic respiratory failure, large volume ascites, imaging findings concerning for malignant process.   Assessment & Plan:   Principal Problem:   Acute respiratory failure with hypoxia (HCC) Active Problems:   Chronic obstructive pulmonary disease (HCC)   Essential hypertension   Hypothyroidism   Pulmonary embolism (HCC)   Ascites  Acute Hypoxic Respiratory Failure Bilateral Multifocal Patchy Opacities Large Volume Ascites Currently requiring 3 L  CT with chronic pulmonary embolism in L lung as well as multifocal patchy opacities in lungs bilaterally, small bilateral effusions Repeat CXR 9/23 Treating for CAP with ceftriaxone  (9/20-present) and azithromycin  (9/20-23) Echo 07/30/24 with grade 1 diastolic dysfunction, moderately elevated PASP Will diurese as tolerated as well - started on spironolactone , continue with lasix   Strict I/O, daily weights  Chronic Pulmonary Embolism Follow LE US  Echo was done on 9/17 showing normal RVSF, mildly enlarged RV.  Moderately elevated PASP. 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   Consultants:  IR  Procedures:   9/21 paracentesis  Antimicrobials:  Anti-infectives (From admission, onward)    Start     Dose/Rate Route Frequency Ordered Stop   08/04/24 1900  cefTRIAXone  (ROCEPHIN ) 2 g in sodium chloride  0.9 % 100 mL IVPB        2 g 200 mL/hr over 30 Minutes Intravenous Every 24 hours 08/04/24 0812 08/06/24 2359   08/03/24 1900  azithromycin  (ZITHROMAX ) 500 mg in sodium chloride  0.9 % 250 mL IVPB        500 mg 250 mL/hr over 60 Minutes Intravenous Every 24 hours 08/02/24 2119 08/04/24 2359   08/03/24 1900  cefTRIAXone  (ROCEPHIN ) 1 g in sodium chloride  0.9 % 100 mL IVPB  Status:  Discontinued        1 g 200 mL/hr over 30 Minutes Intravenous Every 24 hours 08/02/24 2119 08/04/24 0812   08/02/24 1900  cefTRIAXone  (ROCEPHIN ) 1 g in sodium chloride  0.9 % 100 mL IVPB        1 g 200 mL/hr over 30 Minutes Intravenous  Once 08/02/24 1851 08/02/24 2100   08/02/24 1900  azithromycin  (ZITHROMAX ) 500 mg in sodium chloride  0.9 % 250 mL IVPB  500 mg 250 mL/hr over 60 Minutes Intravenous  Once 08/02/24 1851 08/02/24 2245       Subjective: Still feels SOB   Objective: Vitals:   08/05/24 0037 08/05/24 0406 08/05/24 0845 08/05/24 1302  BP: 116/60 (!) 107/53 120/63 129/73  Pulse: 88 80 80 80  Resp: 19 18 18 18   Temp: 97.8 F (36.6 C) 97.9 F (36.6 C) 98.2 F (36.8 C) 97.6 F (36.4 C)  TempSrc:   Oral Oral  SpO2: 100% 99% 99%  100%  Weight:      Height:        Intake/Output Summary (Last 24 hours) at 08/05/2024 1513 Last data filed at 08/05/2024 0606 Gross per 24 hour  Intake 716.27 ml  Output --  Net 716.27 ml   Filed Weights   08/02/24 2017 08/03/24 0500 08/04/24 0500  Weight: 61.2 kg 58.8 kg 61.7 kg    Examination:  General: No acute distress. Cardiovascular: RRR Lungs: unlabored Abdomen: distended Neurological: Alert and oriented 3. Moves all extremities 4 with equal strength. Cranial nerves II through XII grossly intact. Extremities: No clubbing or cyanosis. No edema.   Data Reviewed: I have personally reviewed following labs and imaging studies  CBC: Recent Labs  Lab 08/02/24 1601 08/02/24 1608 08/03/24 0527 08/04/24 0448 08/05/24 0505  WBC 15.5*  --  14.8* 15.1* 15.1*  NEUTROABS 12.6*  --   --  12.0* 12.5*  HGB 14.5 15.6* 14.0 12.1 12.6  HCT 43.0 46.0 42.4 36.0 37.1  MCV 95.1  --  94.4 94.0 92.5  PLT 661*  --  686* 557* 608*    Basic Metabolic Panel: Recent Labs  Lab 07/31/24 1601 08/02/24 1601 08/02/24 1608 08/03/24 0527 08/04/24 0448 08/05/24 0505  NA 130* 132* 133* 135 132* 133*  K 3.2* 3.1* 3.0* 3.6 3.1* 3.9  CL 90* 88* 89* 94* 93* 95*  CO2 32 28  --  29 29 27   GLUCOSE 125* 117* 121* 98 104* 96  BUN 21 17 20 15 13 10   CREATININE 0.86 0.79 0.90 0.88 0.85 0.77  CALCIUM 9.4 9.1  --  8.9 8.1* 8.1*  MG  --   --   --  2.0 1.8 1.7  PHOS  --   --   --   --  2.7 2.2*    GFR: Estimated Creatinine Clearance: 50.5 mL/min (by C-G formula based on SCr of 0.77 mg/dL).  Liver Function Tests: Recent Labs  Lab 08/02/24 1601 08/03/24 0527 08/04/24 0448 08/05/24 0505  AST 25 24 21 29   ALT 18 17 15 19   ALKPHOS 189* 180* 150* 187*  BILITOT 0.8 0.6 0.6 0.5  PROT 6.5 6.2* 5.1* 5.5*  ALBUMIN 3.0* 2.8* 2.3* 2.4*    CBG: No results for input(s): GLUCAP in the last 168 hours.   Recent Results (from the past 240 hours)  Resp panel by RT-PCR (RSV, Flu Kara Newman&B, Covid)  Anterior Nasal Swab     Status: None   Collection Time: 08/02/24  4:26 PM   Specimen: Anterior Nasal Swab  Result Value Ref Range Status   SARS Coronavirus 2 by RT PCR NEGATIVE NEGATIVE Final   Influenza Kara Newman by PCR NEGATIVE NEGATIVE Final   Influenza B by PCR NEGATIVE NEGATIVE Final    Comment: (NOTE) The Xpert Xpress SARS-CoV-2/FLU/RSV plus assay is intended as an aid in the diagnosis of influenza from Nasopharyngeal swab specimens and should not be used as Meily Glowacki sole basis for treatment. Nasal washings and aspirates are unacceptable for Xpert Xpress  SARS-CoV-2/FLU/RSV testing.  Fact Sheet for Patients: BloggerCourse.com  Fact Sheet for Healthcare Providers: SeriousBroker.it  This test is not yet approved or cleared by the [place]  FDA and has been authorized for detection and/or diagnosis of SARS-CoV-2 by FDA under an Emergency Use Authorization (EUA). This EUA will remain in effect (meaning this test can be used) for the duration of the COVID-19 declaration under Section 564(b)(1) of the Act, 21 U.S.C. section 360bbb-3(b)(1), unless the authorization is terminated or revoked.     Resp Syncytial Virus by PCR NEGATIVE NEGATIVE Final    Comment: (NOTE) Fact Sheet for Patients: BloggerCourse.com  Fact Sheet for Healthcare Providers: SeriousBroker.it  This test is not yet approved or cleared by the [place]  FDA and has been authorized for detection and/or diagnosis of SARS-CoV-2 by FDA under an Emergency Use Authorization (EUA). This EUA will remain in effect (meaning this test can be used) for the duration of the COVID-19 declaration under Section 564(b)(1) of the Act, 21 U.S.C. section 360bbb-3(b)(1), unless the authorization is terminated or revoked.  Performed at [facility], [address] [place], [place] [address]   Blood culture (routine x 2)     Status:  None (Preliminary result)   Collection Time: 08/02/24  7:16 PM   Specimen: BLOOD RIGHT ARM  Result Value Ref Range Status   Specimen Description BLOOD RIGHT ARM  Final   Special Requests   Final    BOTTLES DRAWN AEROBIC AND ANAEROBIC Blood Culture adequate volume   Culture   Final    NO GROWTH 3 DAYS Performed at [facility], [address] [place], [place] [address]    Report Status PENDING  Incomplete  Blood culture (routine x 2)     Status: None (Preliminary result)   Collection Time: 08/02/24 10:45 PM   Specimen: BLOOD RIGHT HAND  Result Value Ref Range Status   Specimen Description BLOOD RIGHT HAND  Final   Special Requests   Final    BOTTLES DRAWN AEROBIC AND ANAEROBIC Blood Culture adequate volume   Culture   Final    NO GROWTH 2 DAYS Performed at [facility], [address], [place], [place] [address]    Report Status PENDING  Incomplete  Body fluid culture w Gram Stain     Status: None (Preliminary result)   Collection Time: 08/03/24 10:46 AM   Specimen: Abdomen; Peritoneal Fluid  Result Value Ref Range Status   Specimen Description PERITONEAL  Final   Special Requests ABDOMEN  Final   Gram Stain NO WBC SEEN RARE GRAM POSITIVE COCCI   Final   Culture   Final    NO GROWTH 2 DAYS Performed at [facility], [address] [place], [place] [address]    Report Status PENDING  Incomplete         Radiology Studies: US  Paracentesis Result Date: 08/03/2024 INDICATION: Patient with ascites of uncertain etiology presents today for Julienne Vogler diagnostic and therapeutic paracentesis. EXAM: ULTRASOUND GUIDED PARACENTESIS MEDICATIONS: 1% lidocaine  10 mL COMPLICATIONS: None immediate. PROCEDURE: Informed written consent was obtained from the patient after Rushil Kimbrell discussion of the risks, benefits and alternatives to treatment. Carlton Sweaney timeout was performed prior to the initiation of the procedure. Initial ultrasound scanning demonstrates Avion Patella large amount of ascites within the  right lower abdominal quadrant. The right lower abdomen was prepped and draped in the usual sterile fashion. 1% lidocaine  was used for local anesthesia. Following this, Toria Monte 19 gauge, 7-cm, Yueh catheter  was introduced. An ultrasound image was saved for documentation purposes. The paracentesis was performed. The catheter was removed and Mark Hassey dressing was applied. The patient tolerated the procedure well without immediate post procedural complication. FINDINGS: Ric Rosenberg total of approximately 3 L of clear yellow fluid was removed. Samples were sent to the laboratory as requested by the clinical team. IMPRESSION: Successful ultrasound-guided paracentesis yielding 3 liters of peritoneal fluid. Procedure performed by Warren Dais, NP Electronically Signed   By: Cordella Banner   On: 08/03/2024 10:55   CT Angio Chest PE W and/or Wo Contrast Addendum Date: 08/02/2024 *ADDENDUM #1 * ADDENDUM: Critical Value/emergent results were called by telephone at the time of interpretation on 08/02/2024 at 2036 hrs to provider Dr Ginger. ---------------------------------------------------- Electronically signed by: Pinkie Pebbles MD 08/02/2024 08:49 PM EDT RP Workstation: [id number]   Result Date: 08/02/2024 *ORIGINAL REPORT * EXAM: CTA of the Chest with contrast for PE 08/02/2024 08:10:00 PM TECHNIQUE: CTA of the chest was performed after the administration of intravenous contrast. Multiplanar reformatted images are provided for review. MIP images are provided for review. Automated exposure control, iterative reconstruction, and/or weight based adjustment of the mA/kV was utilized to reduce the radiation dose to as low as reasonably achievable. COMPARISON: Chest radiograph earlier today. CLINICAL HISTORY: Pulmonary embolism (PE) suspected, high prob. Chief complaints; Shortness of Breath; CT Angio Chest PE W and/or Wo Contrast; Pulmonary embolism (PE) suspected, high prob. FINDINGS: PULMONARY ARTERIES: Peripheral linear filling defect  at the bifurcation of the distal left main pulmonary artery (image 61) extending into the segmental lingular pulmonary artery (image 56) as well as the anterior segment left lower lobe (image 85) suggesting mild segmental pulmonary embolism, although possibly chronic given the appearance. MEDIASTINUM: No evidence of right heart strain. Thoracic aortic atherosclerosis. Mild coronary atherosclerosis of the LAD. LYMPH NODES: Small mediastinal lymph nodes which do not need pathologic CT size criteria. LUNGS AND PLEURA: Small bilateral pleural effusions, right greater than left. Multifocal patchy/nodular opacities in the lungs bilaterally, including Shaneya Taketa 2.4 x 2.0 cm nodular opacity in the anteromedial left upper lobe (image 42) and Darrick Greenlaw 4.3 x 3.2 cm mass like opacity in the inferomedial right upper lobe (image 89). This appearance raises concern for multifocal tumor, less likely multifocal infection/pneumonia. Very mild interlobular septal thickening in the upper lobes but this appearance is not favored to reflect interstitial edema. UPPER ABDOMEN: Upper abdomen is better evaluated on recent CT abdomen/pelvis. 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 Mild degenerative changes of the mid thoracic spine. IMPRESSION: 1. 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 No evidence of right heart strain. 2. Multifocal patchy opacities in the lungs bilaterally, some of which may reflect infection/pneumonia, although the appearance is concerning for underlying multifocal tumor. Follow-up CT chest is suggested in 3-4 weeks after appropriate antimicrobial therapy. 3. Small bilateral pleural effusions, right greater than left. Electronically signed by: Pinkie Pebbles MD 08/02/2024 08:40 PM EDT RP Workstation: [id number]   CT ABDOMEN PELVIS W CONTRAST Result Date: 08/02/2024 CLINICAL DATA:  Bowel obstruction suspected. Distended abdomen. Lower  extremity swelling. EXAM: CT ABDOMEN AND PELVIS WITH CONTRAST TECHNIQUE: Multidetector CT imaging of the abdomen and pelvis was performed using the standard protocol following bolus administration of intravenous contrast. RADIATION DOSE REDUCTION: This exam was performed according to the departmental dose-optimization program which includes automated exposure control, adjustment of the mA and/or kV according to patient size and/or use of iterative reconstruction technique. CONTRAST:  75mL OMNIPAQUE  IOHEXOL  350 MG/ML SOLN COMPARISON:  None Available. FINDINGS: Lower chest: Extensive nodular appearing airspace disease peripherally in both lower lungs. Small bilateral pleural effusions. Heart is normal size. Hepatobiliary: Liver appears shrunken with suggestion of nodular surface suggesting cirrhosis. Recommend clinical correlation. Gallbladder unremarkable. No biliary ductal dilatation. Pancreas: Extensive cystic changes throughout the pancreatic body and tail. No surrounding inflammation or ductal dilatation. Spleen: No focal abnormality.  Normal size. Adrenals/Urinary Tract: No suspicious renal or adrenal abnormality. Small scattered bilateral renal cysts. No stones or hydronephrosis. Urinary bladder unremarkable. Stomach/Bowel: Left colonic diverticulosis. No active diverticulitis. Stomach and small bowel decompressed. Vascular/Lymphatic: Aortic atherosclerosis. No evidence of aneurysm or adenopathy. Reproductive: Uterus appears very small.  No adnexal mass. Other: Large volume ascites in the abdomen and pelvis. There appears to be abnormal infiltrated/nodular appearance of the omentum anteriorly. Musculoskeletal: No acute bony abnormality. IMPRESSION: Extensive peripheral nodular airspace disease throughout both lower lungs with small bilateral pleural effusions. This could reflect infectious or neoplastic process. Insert further evaluation with full dedicated chest CT with IV contrast. Liver appears nodular and  shrunken suggesting cirrhosis. Large volume ascites. Abnormal infiltrated/nodular appearance of the omentum in the anterior abdomen. Cannot exclude peritoneal malignancy/carcinomatosis. Left colonic diverticulosis.  No active diverticulitis. Aortic atherosclerosis. Electronically Signed   By: Franky Crease M.D.   On: 08/02/2024 18:15   DG Chest Portable 1 View Result Date: 08/02/2024 CLINICAL DATA:  SOB, rales, edema. EXAM: PORTABLE CHEST 1 VIEW COMPARISON:  06/05/2024. FINDINGS: Bilateral lungs appear hyperlucent with coarse bronchovascular markings, in keeping with COPD. There are diffuse increased interstitial markings overlying the bilateral lower lung zones with associated new bilateral small pleural effusions, favoring congestive heart failure/pulmonary edema. There are retrocardiac opacities partially obscuring the left medial hemidiaphragm, which may represent left lung atelectasis and/or consolidation. Correlate clinically. Stable cardio-mediastinal silhouette. No acute osseous abnormalities. The soft tissues are within normal limits. IMPRESSION: 1. Findings favor congestive heart failure/pulmonary edema. 2. There are retrocardiac opacities partially obscuring the left medial hemidiaphragm, which may represent left lung atelectasis and/or consolidation. Electronically Signed   By: Ree Molt M.D.   On: 08/02/2024 16:44        Scheduled Meds:  apixaban   10 mg Oral BID   Followed by   NOREEN ON 08/12/2024] apixaban   5 mg Oral BID   budesonide -glycopyrrolate -formoterol   2 puff Inhalation BID   furosemide   40 mg Intravenous Once   [START ON 08/06/2024] furosemide   40 mg Intravenous Daily   levothyroxine   100 mcg Oral q morning   sodium chloride  flush  3 mL Intravenous Q12H   spironolactone   50 mg Oral Daily   Continuous Infusions:  cefTRIAXone  (ROCEPHIN )  IV Stopped (08/04/24 2223)     LOS: 2 days    Time spent: over 30 min     Meliton Monte, MD [facility]   To  contact the attending provider between 7A-7P or the covering provider during after hours 7P-7A, please log into the web site [email or web address] and access using universal [place] password for that web site. If you do not have the password, please call the hospital operator.  08/05/2024, 3:13 PM

## 2024-08-06 ENCOUNTER — Inpatient Hospital Stay (HOSPITAL_COMMUNITY)

## 2024-08-06 ENCOUNTER — Encounter (HOSPITAL_COMMUNITY): Payer: Self-pay | Admitting: Internal Medicine

## 2024-08-06 DIAGNOSIS — R188 Other ascites: Secondary | ICD-10-CM | POA: Diagnosis not present

## 2024-08-06 DIAGNOSIS — J189 Pneumonia, unspecified organism: Secondary | ICD-10-CM

## 2024-08-06 DIAGNOSIS — K7460 Unspecified cirrhosis of liver: Secondary | ICD-10-CM

## 2024-08-06 DIAGNOSIS — J9621 Acute and chronic respiratory failure with hypoxia: Secondary | ICD-10-CM | POA: Diagnosis not present

## 2024-08-06 DIAGNOSIS — I2699 Other pulmonary embolism without acute cor pulmonale: Secondary | ICD-10-CM

## 2024-08-06 LAB — CBC
HCT: 37.7 % (ref 36.0–46.0)
Hemoglobin: 13 g/dL (ref 12.0–15.0)
MCH: 31.8 pg (ref 26.0–34.0)
MCHC: 34.5 g/dL (ref 30.0–36.0)
MCV: 92.2 fL (ref 80.0–100.0)
Platelets: 648 K/uL — ABNORMAL HIGH (ref 150–400)
RBC: 4.09 MIL/uL (ref 3.87–5.11)
RDW: 12.8 % (ref 11.5–15.5)
WBC: 15.3 K/uL — ABNORMAL HIGH (ref 4.0–10.5)
nRBC: 0 % (ref 0.0–0.2)

## 2024-08-06 LAB — BODY FLUID CULTURE W GRAM STAIN
Culture: NO GROWTH
Gram Stain: NONE SEEN

## 2024-08-06 MED ORDER — APIXABAN 5 MG PO TABS: 5.0000 mg | ORAL_TABLET | Freq: Two times a day (BID) | ORAL | Status: DC

## 2024-08-06 MED ORDER — CEFADROXIL 500 MG PO CAPS: 1000.0000 mg | ORAL_CAPSULE | Freq: Two times a day (BID) | ORAL | 0 refills | Status: AC

## 2024-08-06 MED ORDER — APIXABAN (ELIQUIS) VTE STARTER PACK (10MG AND 5MG): ORAL_TABLET | ORAL | 0 refills | Status: AC

## 2024-08-06 MED ORDER — LIDOCAINE-EPINEPHRINE 1 %-1:100000 IJ SOLN
INTRAMUSCULAR | Status: AC
  Filled 2024-08-06: qty 1

## 2024-08-06 MED ORDER — APIXABAN 5 MG PO TABS
10.0000 mg | ORAL_TABLET | Freq: Two times a day (BID) | ORAL | Status: DC
  Administered 2024-08-07: 10 mg via ORAL
  Filled 2024-08-06: qty 2

## 2024-08-06 MED ORDER — SPIRONOLACTONE 50 MG PO TABS: 50.0000 mg | ORAL_TABLET | Freq: Every morning | ORAL | 0 refills | Status: DC

## 2024-08-06 MED ORDER — IPRATROPIUM-ALBUTEROL 0.5-2.5 (3) MG/3ML IN SOLN: 3.0000 mL | Freq: Four times a day (QID) | RESPIRATORY_TRACT | 0 refills | Status: AC | PRN

## 2024-08-06 NOTE — Care Management Important Message (Signed)
 Important Message  Patient Details  Name: Kara Newman MRN: [id number] Date of Birth: [birth year]   Important Message Given:  Yes - Medicare IM     Claretta Deed 08/06/2024, 1:55 PM

## 2024-08-06 NOTE — Assessment & Plan Note (Addendum)
 Prior to 08/06/24 continue with synthroid   08/06/24 stable on synthroid .  08/07/24 continue synthroid .

## 2024-08-06 NOTE — Progress Notes (Signed)
 Physical Therapy Treatment Patient Details Name: Kara Newman MRN: [id number] DOB: 1945-12-02 Today's Date: 08/06/2024   History of Present Illness 78 y.o. female presented to the ED 9/20 for evaluation of shortness of breath, abdominal and lower extremity swelling. Admitted with acute hypoxic respiratory failure, large volume ascites, imaging findings concerning for malignant process. S/p 9/21 paracentesis from R lower quadrant yielding 3L. EFY:RNEI, left breast cancer s/p mastectomy, HTN, HLD, hypothyroidism    PT Comments  Pt eager to get home, willing to work with therapy. Pt is making good progress towards her goals.Currently, pt is transferring and ambulating at a contact guard level. Pt treatment session shorted by arrival of transportation to take her to IR. Pt able to return to bed at a supervision level. D/c plans remain appropriate at this time. PT will continue to follow acutely.    If plan is discharge home, recommend the following: A little help with walking and/or transfers;A little help with bathing/dressing/bathroom   Can travel by private vehicle      yes  Equipment Recommendations  BSC/3in1       Precautions / Restrictions Precautions Precautions: Fall Precaution/Restrictions Comments: history of fall 6 days ago Restrictions Weight Bearing Restrictions Per Provider Order: No     Mobility  Bed Mobility Overal bed mobility: Needs Assistance Bed Mobility: Sit to Supine       Sit to supine: Supervision   General bed mobility comments: supervision for getting back into bed to go to IR    Transfers Overall transfer level: Needs assistance Equipment used: Rolling walker (2 wheels) Transfers: Sit to/from Stand Sit to Stand: Contact guard assist           General transfer comment: contact guard for power up from lower recliner, supervision for power up from [facility] with arm rests    Ambulation/Gait Ambulation/Gait assistance: Contact guard assist Gait  Distance (Feet): 35 Feet Assistive device: Rolling walker (2 wheels) Gait Pattern/deviations: Step-through pattern, Decreased step length - right, Decreased step length - left, Shuffle, Narrow base of support Gait velocity: slowed Gait velocity interpretation: <1.31 ft/sec, indicative of household ambulator   General Gait Details: contact guard for safety with slowed, mildly unsteady gait, no overt LoB         Balance Overall balance assessment: Mild deficits observed, not formally tested                                          Communication Communication Communication: No apparent difficulties  Cognition Arousal: Alert Behavior During Therapy: WFL for tasks assessed/performed   PT - Cognitive impairments: No apparent impairments                         Following commands: Intact      Cueing Cueing Techniques: Verbal cues     General Comments General comments (skin integrity, edema, etc.): SpO2 on 2L O2 >90%O2      Pertinent Vitals/Pain Pain Assessment Pain Assessment: 0-10 Pain Score: 4  Pain Location: stomach pressure Pain Descriptors / Indicators: Tightness, Pressure Pain Intervention(s): Limited activity within patient's tolerance, Monitored during session, Repositioned     PT Goals (current goals can now be found in the care plan section) Acute Rehab PT Goals PT Goal Formulation: With patient Time For Goal Achievement: 08/18/24 Potential to Achieve Goals: Good Progress towards PT goals: Progressing toward goals  Frequency    Min 2X/week       AM-PAC PT 6 Clicks Mobility   Outcome Measure  Help needed turning from your back to your side while in a flat bed without using bedrails?: None Help needed moving from lying on your back to sitting on the side of a flat bed without using bedrails?: None Help needed moving to and from a bed to a chair (including a wheelchair)?: None Help needed standing up from a chair using  your arms (e.g., wheelchair or bedside chair)?: A Little Help needed to walk in hospital room?: A Little Help needed climbing 3-5 steps with a railing? : A Lot 6 Click Score: 20    End of Session Equipment Utilized During Treatment: Gait belt;Oxygen Activity Tolerance: Patient tolerated treatment well Patient left: in bed;Other (comment) (picked up by transport to take to IR) Nurse Communication: Mobility status PT Visit Diagnosis: History of falling (Z91.81);Unsteadiness on feet (R26.81);Muscle weakness (generalized) (M62.81);Difficulty in walking, not elsewhere classified (R26.2)     Time: 8986-8970 PT Time Calculation (min) (ACUTE ONLY): 16 min  Charges:    $Gait Training: 8-22 mins PT General Charges $$ ACUTE PT VISIT: 1 Visit                     Rickia Freeburg B. Fleeta Lapidus PT, DPT Acute Rehabilitation Services Please use secure chat or  Call Office [phone]    Almarie KATHEE Fleeta [facility] 08/06/2024, 10:46 AM

## 2024-08-06 NOTE — Assessment & Plan Note (Addendum)
 Prior to 08/06/24 pt on 2 L/min at home. Was on 3 L/min prior to diuresis and treatment for pneumonia. Caused by pneumonia.  08/06/24 O2 weaned back down to 2 L/min.  08/07/24 resolved acute hypoxic respiratory failure. Pt back down to her baseline 2-3 L/min O2. I reminded her that she needs to tell her son to bring portable O2 tank to hospital for her discharge today.

## 2024-08-06 NOTE — Assessment & Plan Note (Addendum)
 Prior to 08/06/24 pt follow with Dr. Meade with [facility] pulmonology. She did not have a COPD exacerbation. Home nebulizer machine ordered. Pt will f/u with outpatient pulmonology for f/u of potential lung mass and how best to proceed with diagnostic workup.  08/06/24 f/u with outpatient pulmonology.  08/07/24 referral made to outpatient pulmonology.

## 2024-08-06 NOTE — Progress Notes (Signed)
 Patient presents for diagnostic paracentesis. US  limited abdomen shows Small  amount of peritoneal fluid. Patient declined the procedure at this time.

## 2024-08-06 NOTE — Plan of Care (Signed)

## 2024-08-06 NOTE — Assessment & Plan Note (Addendum)
 Prior to 08/06/24 on IV rocephin .   08/06/24 Change to po duricef 1000 mg bid. Due concern for lung mass on CTPA, pt will need to f/u outpatient pulmonology(Dr. Meade) after treatment of pneumonia.  08/07/24 pt to complete course of duricef 1000 mg bid x 5 days. She received 5 days of IV Rocephin . She will need to f/u with outpatient pulmonology for further workup of lung mass seen on CT chest 08-02-2024

## 2024-08-06 NOTE — Plan of Care (Signed)
   Problem: Education: Goal: Knowledge of General Education information will improve Description: Including pain rating scale, medication(s)/side effects and non-pharmacologic comfort measures Outcome: Progressing   Problem: Clinical Measurements: Goal: Ability to maintain clinical measurements within normal limits will improve Outcome: Progressing Goal: Diagnostic test results will improve Outcome: Progressing Goal: Respiratory complications will improve Outcome: Progressing   Problem: Activity: Goal: Risk for activity intolerance will decrease Outcome: Progressing

## 2024-08-06 NOTE — Progress Notes (Signed)
 PROGRESS NOTE    Kara Newman  FMW:[id number] DOB: 09/11/1946 DOA: 08/02/2024 PCP: Elliot Charm  Subjective: Pt seen and examined. Met with pt and her son [name] at home.  Pt lives at home by herself. Son has moved a bed onto the first floor. Pt does not want a hospital bed at her home. Pt able to walk 100 feet yesterday.  Pt does not have enough ascites on abd U/S to safely perform paracentesis today.  Pt wants to go home tomorrow instead of tonight.   Hospital Course: CC: edema, abd distension HPI: Kara Newman is a 78 y.o. female with medical history significant for COPD, left breast cancer s/p mastectomy, HTN, HLD, hypothyroidism who presented to the ED for evaluation of shortness of breath, abdominal and lower extremity swelling.   Patient states that about 3 weeks ago she developed progressive shortness of breath.  Worse with exertion and when lying flat.  Since then she has had new supplemental O2 requirement of 3 L via [place].  She also has noticed increased swelling of her abdomen during this time.  About 4 days ago she noticed swelling in both of her legs.  She denies significant cough.   She was seen in the pulmonology clinic on 07/24/2024.  There was concern for volume overload due to possible congestive heart failure.  She was started on Lasix  20 mg for 5-day course.  Echocardiogram was ordered and performed on 07/30/2024.  It showed EF 60-65%, no LV RWMA, G1DD, normal RV systolic function, moderately elevated PA systolic pressure.   Patient states that she took the diuretic as prescribed but did not notice any significant change in her dyspnea.  She has not had any chest pain, nausea, vomiting, abdominal pain.   ED Course  Labs/Imaging on admission: I have personally reviewed following labs and imaging studies.   Initial vitals showed BP 151/86, pulse 94, RR 23, temp 97.8 F, SpO2 93% on 3 L O2 via [place].   Labs showed sodium 132, potassium 3.1, bicarb 28, BUN 17,  creatinine 0.79, serum glucose 117, AST 25, ALT 18, alk phos 189, total bilirubin 0.8, lactic acid 1.5, BNP 48.8, WBC 15.5, hemoglobin 14.5, platelets 661, troponin 13.   SARS-CoV-2, influenza, RSV PCR negative.  Blood cultures ordered and pending.   Portable chest x-ray showed increased interstitial markings overlying bilateral lower lung zones with new bilateral small pleural effusions.  Retrocardiac opacities partially obscuring the left medial hemidiaphragm.   CT abdomen/pelvis with contrast showed extensive peripheral nodular airspace disease throughout both lower lungs with small bilateral pleural effusions which could reflect infectious or neoplastic process.  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  Large volume ascites.  Abnormal infiltrative/nodular appearance of the omentum and the anterior abdomen, cannot exclude peritoneal malignancy/carcinomatosis.  Left colonic diverticulosis without diverticulitis.   CTA chest showed linear segmental PE in the left lung, favored to be chronic.  No evidence of right heart strain.  Multifocal patchy opacities in the lungs bilaterally, some of which may reflect infection/pneumonia although the appearance is concerning for underlying multifocal tumor.  Small bilateral pleural effusions, right greater than left also noted.   Patient was given IV ceftriaxone  and azithromycin .  The hospitalist service was consulted for admission.  Significant Events: Admitted 08/02/2024 for acute respiratory failure with hypoxia, ascites   Admission Labs: WBC 15.5, HgB 14.5, plt 661 Na 132, K 3.1, CO2 of 28, BUN 17, Scr 0.79, glu 117 T. Prot 6.5, alb 3.0, AST 26, ALT  18, alk phos 189, t. Bili 0.8 Lipase 485 BNP 48 Covid/rsv/flu Negative UA spg >1.046, negative nitrite, negative LE Troponin I 15, 13 NH3 of 29  Admission Imaging Studies: CXR Findings favor congestive heart failure/pulmonary edema. 2. There are retrocardiac opacities partially  obscuring the left medial hemidiaphragm, which may represent left lung atelectasis and/or consolidation. CT abd/pelvis Extensive peripheral nodular airspace disease throughout both lower lungs with small bilateral pleural effusions. This could reflect infectious or neoplastic process. Insert further evaluation with full dedicated chest CT with IV contrast. 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 Large volume ascites. Abnormal infiltrated/nodular appearance of the omentum in the anterior abdomen. Cannot exclude peritoneal malignancy/carcinomatosis. Left colonic diverticulosis.  No active diverticulitis. Aortic atherosclerosis CTPA Linear segmental pulmonary embolism in the left lung, favored to be chronic. No evidence of right heart strain. 2. Multifocal patchy opacities in the lungs bilaterally, some of which may reflect infection/pneumonia, although the appearance is concerning for underlying multifocal tumor. Follow-up CT chest is suggested in 3-4 weeks after appropriate antimicrobial therapy. 3. Small bilateral pleural effusions, right greater than left.  Significant Labs: Strep pneumo antigen Negative Legionella antigen Negative 08-03-2024 paracentesis cell count, total nucleated cells 477, 47% neutrophils, absolute neutrophil count 224 Ascites gram stain rare GPC  Significant Imaging Studies: 08-05-2024 CXR Continued bilateral lower lobe airspace opacities, similar to prior study.  Antibiotic Therapy: Anti-infectives (From admission, onward)    Start     Dose/Rate Route Frequency Ordered Stop   08/04/24 1900  cefTRIAXone  (ROCEPHIN ) 2 g in sodium chloride  0.9 % 100 mL IVPB        2 g 200 mL/hr over 30 Minutes Intravenous Every 24 hours 08/04/24 0812 08/06/24 2359   08/03/24 1900  azithromycin  (ZITHROMAX ) 500 mg in sodium chloride  0.9 % 250 mL IVPB        500 mg 250 mL/hr over 60 Minutes Intravenous Every 24 hours 08/02/24 2119 08/04/24 2359   08/03/24 1900  cefTRIAXone   (ROCEPHIN ) 1 g in sodium chloride  0.9 % 100 mL IVPB  Status:  Discontinued        1 g 200 mL/hr over 30 Minutes Intravenous Every 24 hours 08/02/24 2119 08/04/24 0812   08/02/24 1900  cefTRIAXone  (ROCEPHIN ) 1 g in sodium chloride  0.9 % 100 mL IVPB        1 g 200 mL/hr over 30 Minutes Intravenous  Once 08/02/24 1851 08/02/24 2100   08/02/24 1900  azithromycin  (ZITHROMAX ) 500 mg in sodium chloride  0.9 % 250 mL IVPB        500 mg 250 mL/hr over 60 Minutes Intravenous  Once 08/02/24 1851 08/02/24 2245       Procedures: 08-03-2024 paracentesis for 3 liters  Consultants:     Assessment and Plan: * Acute on chronic respiratory failure with hypoxia (HCC) - home O2 @ 2 L/min. Prior to 08/06/24 pt on 2 L/min at home. Was on 3 L/min prior to diuresis and treatment for pneumonia. Caused by pneumonia.  08/06/24 O2 weaned back down to 2 L/min.   Right upper lobe pneumonia Prior to 08/06/24 on IV rocephin .   08/06/24 Change to po duricef 1000 mg bid. Due concern for lung mass on CTPA, pt will need to f/u outpatient pulmonology(Dr. Meade) after treatment of pneumonia.   Cirrhosis of liver (HCC) 08/06/24 refer pt to out patient GI for further workup. Continue with lasix  and aldcatone   Ascites Prior to 08/06/24 had paracentesis on 08-03-2024. Didn't meet strict criteria for SBP. Absolute PMN 224.  But pt was partially treated for SBP. She did not receive IV albumin. Pt does not carry a prior diagnosis of ascites. Pt did drink wine on a daily basis for several decades but never more than 2-3 drinks per day according to pt's son.  08/06/24 cytology is still pending. Ascites cultures are negative. Will treat with po duricef. Refer her to outpatient GI for f/u. Continue with lasix  and aldcatone   Acute pulmonary embolism without acute cor pulmonale (HCC) Prior to 08/06/24 seen on CTPA 08-02-2024. Started on eliquis   08/06/24 pt will need 6 months of treatment with Eliquis .  She can f/u with  outpatient pulmonology if longer duration is needed.   Acquired hypothyroidism Prior to 08/06/24 continue with synthroid   08/06/24 stable on synthroid .   Essential hypertension Prior to 08/06/24 BP meds held due to diuresis.   08/06/24 will stop ARB, chlorthalidone at discharge. Continue with lasix  and aldcatone   Chronic obstructive pulmonary disease (HCC) Prior to 08/06/24 pt follow with Dr. Meade with [facility] pulmonology. She did not have a COPD exacerbation. Home nebulizer machine ordered. Pt will f/u with outpatient pulmonology for f/u of potential lung mass and how best to proceed with diagnostic workup.  08/06/24 f/u with outpatient pulmonology.   DVT prophylaxis:  apixaban  (ELIQUIS ) tablet 10 mg  apixaban  (ELIQUIS ) tablet 5 mg     Code Status: Full Code Family Communication: discussed with pt's son [name] at bedside Disposition Plan: return home Reason for continuing need for hospitalization: remains on IV lasix .  Objective: Vitals:   08/06/24 0700 08/06/24 0738 08/06/24 0738 08/06/24 1559  BP: 132/79  136/79 (!) 140/83  Pulse: 84 83 80 85  Resp: 18 18 18 18   Temp: 98 F (36.7 C)  97.9 F (36.6 C) 97.7 F (36.5 C)  TempSrc: Oral     SpO2: 96% 98% 100% 98%  Weight:      Height:        Intake/Output Summary (Last 24 hours) at 08/06/2024 1627 Last data filed at 08/06/2024 1200 Gross per 24 hour  Intake 490 ml  Output 200 ml  Net 290 ml   Filed Weights   08/03/24 0500 08/04/24 0500 08/06/24 0500  Weight: 58.8 kg 61.7 kg 54.7 kg    Examination:  Physical Exam Vitals and nursing note reviewed.  Constitutional:      General: She is not in acute distress.    Appearance: She is not toxic-appearing.  HENT:     Head: Normocephalic and atraumatic.     Nose: Nose normal.  Cardiovascular:     Rate and Rhythm: Normal rate.  Pulmonary:     Effort: Pulmonary effort is normal.     Breath sounds: Normal breath sounds.  Abdominal:     General: Bowel sounds are  normal. There is no distension.     Palpations: Abdomen is soft.  Musculoskeletal:     Right ankle: Swelling present.     Left ankle: Swelling present.     Comments: +1 pitting bilateral ankles edema  Skin:    General: Skin is warm and dry.     Capillary Refill: Capillary refill takes less than 2 seconds.  Neurological:     Mental Status: She is alert and oriented to person, place, and time.    Data Reviewed: I have personally reviewed following labs and imaging studies  CBC: Recent Labs  Lab 08/02/24 1601 08/02/24 1608 08/03/24 0527 08/04/24 0448 08/05/24 0505 08/06/24 0509  WBC 15.5*  --  14.8* 15.1*  15.1* 15.3*  NEUTROABS 12.6*  --   --  12.0* 12.5*  --   HGB 14.5 15.6* 14.0 12.1 12.6 13.0  HCT 43.0 46.0 42.4 36.0 37.1 37.7  MCV 95.1  --  94.4 94.0 92.5 92.2  PLT 661*  --  686* 557* 608* 648*   Basic Metabolic Panel: Recent Labs  Lab 07/31/24 1601 08/02/24 1601 08/02/24 1608 08/03/24 0527 08/04/24 0448 08/05/24 0505  NA 130* 132* 133* 135 132* 133*  K 3.2* 3.1* 3.0* 3.6 3.1* 3.9  CL 90* 88* 89* 94* 93* 95*  CO2 32 28  --  29 29 27   GLUCOSE 125* 117* 121* 98 104* 96  BUN 21 17 20 15 13 10   CREATININE 0.86 0.79 0.90 0.88 0.85 0.77  CALCIUM 9.4 9.1  --  8.9 8.1* 8.1*  MG  --   --   --  2.0 1.8 1.7  PHOS  --   --   --   --  2.7 2.2*   GFR: Estimated Creatinine Clearance: 48.4 mL/min (by C-G formula based on SCr of 0.77 mg/dL). Liver Function Tests: Recent Labs  Lab 08/02/24 1601 08/03/24 0527 08/04/24 0448 08/05/24 0505  AST 25 24 21 29   ALT 18 17 15 19   ALKPHOS 189* 180* 150* 187*  BILITOT 0.8 0.6 0.6 0.5  PROT 6.5 6.2* 5.1* 5.5*  ALBUMIN 3.0* 2.8* 2.3* 2.4*   Recent Labs  Lab 08/02/24 1601  LIPASE 485*   Recent Labs  Lab 08/02/24 1925  AMMONIA 29   Coagulation Profile: Recent Labs  Lab 08/02/24 1916  INR 1.0   BNP (last 3 results) Recent Labs    08/02/24 1626 08/03/24 0527 08/05/24 0505  BNP 48.8 94.6 65.7   Sepsis  Labs: Recent Labs  Lab 08/02/24 1609 08/02/24 1926  LATICACIDVEN 1.5 1.3    Recent Results (from the past 240 hours)  Resp panel by RT-PCR (RSV, Flu A&B, Covid) Anterior Nasal Swab     Status: None   Collection Time: 08/02/24  4:26 PM   Specimen: Anterior Nasal Swab  Result Value Ref Range Status   SARS Coronavirus 2 by RT PCR NEGATIVE NEGATIVE Final   Influenza A by PCR NEGATIVE NEGATIVE Final   Influenza B by PCR NEGATIVE NEGATIVE Final    Comment: (NOTE) The Xpert Xpress SARS-CoV-2/FLU/RSV plus assay is intended as an aid in the diagnosis of influenza from Nasopharyngeal swab specimens and should not be used as a sole basis for treatment. Nasal washings and aspirates are unacceptable for Xpert Xpress SARS-CoV-2/FLU/RSV testing.  Fact Sheet for Patients: BloggerCourse.com  Fact Sheet for Healthcare Providers: SeriousBroker.it  This test is not yet approved or cleared by the [place]  FDA and has been authorized for detection and/or diagnosis of SARS-CoV-2 by FDA under an Emergency Use Authorization (EUA). This EUA will remain in effect (meaning this test can be used) for the duration of the COVID-19 declaration under Section 564(b)(1) of the Act, 21 U.S.C. section 360bbb-3(b)(1), unless the authorization is terminated or revoked.     Resp Syncytial Virus by PCR NEGATIVE NEGATIVE Final    Comment: (NOTE) Fact Sheet for Patients: BloggerCourse.com  Fact Sheet for Healthcare Providers: SeriousBroker.it  This test is not yet approved or cleared by the [place]  FDA and has been authorized for detection and/or diagnosis of SARS-CoV-2 by FDA under an Emergency Use Authorization (EUA). This EUA will remain in effect (meaning this test can be used) for the duration of the COVID-19 declaration under Section  564(b)(1) of the Act, 21 U.S.C. section 360bbb-3(b)(1), unless  the authorization is terminated or revoked.  Performed at [facility], [address] [place], [place] [address]   Blood culture (routine x 2)     Status: None (Preliminary result)   Collection Time: 08/02/24  7:16 PM   Specimen: BLOOD RIGHT ARM  Result Value Ref Range Status   Specimen Description BLOOD RIGHT ARM  Final   Special Requests   Final    BOTTLES DRAWN AEROBIC AND ANAEROBIC Blood Culture adequate volume   Culture   Final    NO GROWTH 4 DAYS Performed at [facility], [address], [place], [place] [address]    Report Status PENDING  Incomplete  Blood culture (routine x 2)     Status: None (Preliminary result)   Collection Time: 08/02/24 10:45 PM   Specimen: BLOOD RIGHT HAND  Result Value Ref Range Status   Specimen Description BLOOD RIGHT HAND  Final   Special Requests   Final    BOTTLES DRAWN AEROBIC AND ANAEROBIC Blood Culture adequate volume   Culture   Final    NO GROWTH 3 DAYS Performed at [facility], [address] [place], [place] [address]    Report Status PENDING  Incomplete  Body fluid culture w Gram Stain     Status: None   Collection Time: 08/03/24 10:46 AM   Specimen: Abdomen; Peritoneal Fluid  Result Value Ref Range Status   Specimen Description PERITONEAL  Final   Special Requests ABDOMEN  Final   Gram Stain NO WBC SEEN RARE GRAM POSITIVE COCCI   Final   Culture   Final    NO GROWTH 3 DAYS Performed at [facility], [address], [place], [place] [address]    Report Status 08/06/2024 FINAL  Final     Radiology Studies: IR ABDOMEN US  LIMITED Result Date: 08/06/2024 INDICATION: 78 year old female. History of breast cancer. Found to have ascites. Request is for a diagnostic paracentesis. EXAM: ULTRASOUND ABDOMEN LIMITED FINDINGS: Imaging of all 4 quadrants of the abdomen reveal small amount ascites in the right upper quadrant. Trace amounts in the other 3 quadrants IMPRESSION: Small amount of ascites seen on  ultrasound. After discussion of the risks versus benefits of the procedure the patient decided to defer paracentesis at this time. Read by: Delon Beagle, NP Electronically Signed   By: Ester Sides M.D.   On: 08/06/2024 14:31   DG CHEST PORT 1 VIEW Result Date: 08/05/2024 CLINICAL DATA:  Shortness of breath EXAM: PORTABLE CHEST 1 VIEW COMPARISON:  08/02/2024 FINDINGS: Heart and mediastinal contours are within normal limits. Bilateral lower lobe airspace disease again noted, similar to prior study. No visible effusions or pneumothorax. No acute bony abnormality. IMPRESSION: Continued bilateral lower lobe airspace opacities, similar to prior study. Electronically Signed   By: Franky Crease M.D.   On: 08/05/2024 20:40    Scheduled Meds:  [START ON 08/07/2024] apixaban   10 mg Oral BID   Followed by   NOREEN ON 08/12/2024] apixaban   5 mg Oral BID   budesonide -glycopyrrolate -formoterol   2 puff Inhalation BID   furosemide   40 mg Intravenous Daily   levothyroxine   100 mcg Oral q morning   sodium chloride  flush  3 mL Intravenous Q12H   spironolactone   50 mg Oral Daily   Continuous Infusions:  cefTRIAXone  (ROCEPHIN )  IV Stopped (08/05/24 2334)     LOS: 3 days   Time spent: 60 minutes  Camellia Door, DO  [facility]  08/06/2024, 4:27 PM

## 2024-08-06 NOTE — Assessment & Plan Note (Addendum)
 Prior to 08/06/24 had paracentesis on 08-03-2024. Didn't meet strict criteria for SBP. Absolute PMN 224. But pt was partially treated for SBP. She did not receive IV albumin. Pt does not carry a prior diagnosis of ascites. Pt did drink wine on a daily basis for several decades but never more than 2-3 drinks per day according to pt's son.  08/06/24 cytology is still pending. Ascites cultures are negative. Will treat with po duricef. Refer her to outpatient GI for f/u. Continue with lasix  and aldcatone  08/07/24 cytology results still pending at time of discharge. Outpatient referral made to [facility] for further workup of her ascites.

## 2024-08-06 NOTE — Assessment & Plan Note (Addendum)
 Prior to 08/06/24 BP meds held due to diuresis.   08/06/24 will stop ARB, chlorthalidone at discharge. Continue with lasix  and aldcatone

## 2024-08-06 NOTE — Assessment & Plan Note (Addendum)
 08/06/24 refer pt to out patient GI for further workup. Continue with lasix  and aldcatone  08/07/24 cytology results still pending at time of discharge. Outpatient referral made to [facility] for further workup of her ascites.

## 2024-08-06 NOTE — Assessment & Plan Note (Addendum)
 Prior to 08/06/24 seen on CTPA 08-02-2024. Started on eliquis   08/06/24 pt will need 6 months of treatment with Eliquis .  She can f/u with outpatient pulmonology if longer duration is needed.  08/07/24 continue with po eliquis  for 6 months.

## 2024-08-06 NOTE — Subjective & Objective (Signed)
 Pt seen and examined. She is excited to go home today. I reminded her that she needs to tell her son to bring portable O2 tank to hospital for her discharge today.

## 2024-08-07 DIAGNOSIS — E039 Hypothyroidism, unspecified: Secondary | ICD-10-CM

## 2024-08-07 DIAGNOSIS — J9621 Acute and chronic respiratory failure with hypoxia: Secondary | ICD-10-CM | POA: Diagnosis not present

## 2024-08-07 DIAGNOSIS — R600 Localized edema: Secondary | ICD-10-CM

## 2024-08-07 DIAGNOSIS — J189 Pneumonia, unspecified organism: Secondary | ICD-10-CM | POA: Diagnosis not present

## 2024-08-07 DIAGNOSIS — R188 Other ascites: Secondary | ICD-10-CM | POA: Diagnosis not present

## 2024-08-07 DIAGNOSIS — I10 Essential (primary) hypertension: Secondary | ICD-10-CM

## 2024-08-07 DIAGNOSIS — J449 Chronic obstructive pulmonary disease, unspecified: Secondary | ICD-10-CM

## 2024-08-07 DIAGNOSIS — I2699 Other pulmonary embolism without acute cor pulmonale: Secondary | ICD-10-CM | POA: Diagnosis not present

## 2024-08-07 LAB — CULTURE, BLOOD (ROUTINE X 2)
Culture: NO GROWTH
Special Requests: ADEQUATE

## 2024-08-07 NOTE — Progress Notes (Signed)
 Reviewed AVS, patient expressed understanding of medications, MD follow up reviewed.   Removed IV, Site clean, dry and intact.  Patient states all belongings brought to the hospital at time of admission are accounted for and packed to take home.  Patient informed and expressed understanding where to pick up discharge medications.  Patient currently waiting for son to bring portable/travel oxygen from home to transport home with patient.

## 2024-08-07 NOTE — Progress Notes (Signed)
 PROGRESS NOTE    Antonina Deziel [facility]  FMW:[id number] DOB: [birth year] DOA: 08/02/2024 PCP: Elliot Charm  Subjective: Pt seen and examined. She is excited to go home today. I reminded her that she needs to tell her son to bring portable O2 tank to hospital for her discharge today.   Hospital Course: CC: edema, abd distension HPI: DARTHY Newman is a 78 y.o. female with medical history significant for COPD, left breast cancer s/p mastectomy, HTN, HLD, hypothyroidism who presented to the ED for evaluation of shortness of breath, abdominal and lower extremity swelling.   Patient states that about 3 weeks ago she developed progressive shortness of breath.  Worse with exertion and when lying flat.  Since then she has had new supplemental O2 requirement of 3 L via [place].  She also has noticed increased swelling of her abdomen during this time.  About 4 days ago she noticed swelling in both of her legs.  She denies significant cough.   She was seen in the pulmonology clinic on 07/24/2024.  There was concern for volume overload due to possible congestive heart failure.  She was started on Lasix  20 mg for 5-day course.  Echocardiogram was ordered and performed on 07/30/2024.  It showed EF 60-65%, no LV RWMA, G1DD, normal RV systolic function, moderately elevated PA systolic pressure.   Patient states that she took the diuretic as prescribed but did not notice any significant change in her dyspnea.  She has not had any chest pain, nausea, vomiting, abdominal pain.   ED Course  Labs/Imaging on admission: I have personally reviewed following labs and imaging studies.   Initial vitals showed BP 151/86, pulse 94, RR 23, temp 97.8 F, SpO2 93% on 3 L O2 via [place].   Labs showed sodium 132, potassium 3.1, bicarb 28, BUN 17, creatinine 0.79, serum glucose 117, AST 25, ALT 18, alk phos 189, total bilirubin 0.8, lactic acid 1.5, BNP 48.8, WBC 15.5, hemoglobin 14.5, platelets 661, troponin 13.   SARS-CoV-2,  influenza, RSV PCR negative.  Blood cultures ordered and pending.   Portable chest x-ray showed increased interstitial markings overlying bilateral lower lung zones with new bilateral small pleural effusions.  Retrocardiac opacities partially obscuring the left medial hemidiaphragm.   CT abdomen/pelvis with contrast showed extensive peripheral nodular airspace disease throughout both lower lungs with small bilateral pleural effusions which could reflect infectious or neoplastic process.  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  Large volume ascites.  Abnormal infiltrative/nodular appearance of the omentum and the anterior abdomen, cannot exclude peritoneal malignancy/carcinomatosis.  Left colonic diverticulosis without diverticulitis.   CTA chest showed linear segmental PE in the left lung, favored to be chronic.  No evidence of right heart strain.  Multifocal patchy opacities in the lungs bilaterally, some of which may reflect infection/pneumonia although the appearance is concerning for underlying multifocal tumor.  Small bilateral pleural effusions, right greater than left also noted.   Patient was given IV ceftriaxone  and azithromycin .  The hospitalist service was consulted for admission.  Significant Events: Admitted 08/02/2024 for acute respiratory failure with hypoxia, ascites   Admission Labs: WBC 15.5, HgB 14.5, plt 661 Na 132, K 3.1, CO2 of 28, BUN 17, Scr 0.79, glu 117 T. Prot 6.5, alb 3.0, AST 26, ALT 18, alk phos 189, t. Bili 0.8 Lipase 485 BNP 48 Covid/rsv/flu Negative UA spg >1.046, negative nitrite, negative LE Troponin I 15, 13 NH3 of 29  Admission Imaging Studies: CXR Findings favor congestive heart failure/pulmonary edema. 2.  There are retrocardiac opacities partially obscuring the left medial hemidiaphragm, which may represent left lung atelectasis and/or consolidation. CT abd/pelvis Extensive peripheral nodular airspace disease throughout both lower  lungs with small bilateral pleural effusions. This could reflect infectious or neoplastic process. Insert further evaluation with full dedicated chest CT with IV contrast. 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 Large volume ascites. Abnormal infiltrated/nodular appearance of the omentum in the anterior abdomen. Cannot exclude peritoneal malignancy/carcinomatosis. Left colonic diverticulosis.  No active diverticulitis. Aortic atherosclerosis CTPA Linear segmental pulmonary embolism in the left lung, favored to be chronic. No evidence of right heart strain. 2. Multifocal patchy opacities in the lungs bilaterally, some of which may reflect infection/pneumonia, although the appearance is concerning for underlying multifocal tumor. Follow-up CT chest is suggested in 3-4 weeks after appropriate antimicrobial therapy. 3. Small bilateral pleural effusions, right greater than left.  Significant Labs: Strep pneumo antigen Negative Legionella antigen Negative 08-03-2024 paracentesis cell count, total nucleated cells 477, 47% neutrophils, absolute neutrophil count 224 Ascites gram stain rare GPC  Significant Imaging Studies: 08-05-2024 CXR Continued bilateral lower lobe airspace opacities, similar to prior study.  Antibiotic Therapy: Anti-infectives (From admission, onward)    Start     Dose/Rate Route Frequency Ordered Stop   08/04/24 1900  cefTRIAXone  (ROCEPHIN ) 2 g in sodium chloride  0.9 % 100 mL IVPB        2 g 200 mL/hr over 30 Minutes Intravenous Every 24 hours 08/04/24 0812 08/06/24 2359   08/03/24 1900  azithromycin  (ZITHROMAX ) 500 mg in sodium chloride  0.9 % 250 mL IVPB        500 mg 250 mL/hr over 60 Minutes Intravenous Every 24 hours 08/02/24 2119 08/04/24 2359   08/03/24 1900  cefTRIAXone  (ROCEPHIN ) 1 g in sodium chloride  0.9 % 100 mL IVPB  Status:  Discontinued        1 g 200 mL/hr over 30 Minutes Intravenous Every 24 hours 08/02/24 2119 08/04/24 0812   08/02/24 1900   cefTRIAXone  (ROCEPHIN ) 1 g in sodium chloride  0.9 % 100 mL IVPB        1 g 200 mL/hr over 30 Minutes Intravenous  Once 08/02/24 1851 08/02/24 2100   08/02/24 1900  azithromycin  (ZITHROMAX ) 500 mg in sodium chloride  0.9 % 250 mL IVPB        500 mg 250 mL/hr over 60 Minutes Intravenous  Once 08/02/24 1851 08/02/24 2245       Procedures: 08-03-2024 paracentesis for 3 liters  Consultants:     Assessment and Plan: * Acute on chronic respiratory failure with hypoxia (HCC) - home O2 @ 2 L/min. Prior to 08/06/24 pt on 2 L/min at home. Was on 3 L/min prior to diuresis and treatment for pneumonia. Caused by pneumonia.  08/06/24 O2 weaned back down to 2 L/min.  08/07/24 resolved acute hypoxic respiratory failure. Pt back down to her baseline 2-3 L/min O2. I reminded her that she needs to tell her son to bring portable O2 tank to hospital for her discharge today.    Bilateral leg edema 08/07/24 pt will need BMP in PCP office on Monday, August 11, 2024 to make sure she is not over-diuresed.   Right upper lobe pneumonia Prior to 08/06/24 on IV rocephin .   08/06/24 Change to po duricef 1000 mg bid. Due concern for lung mass on CTPA, pt will need to f/u outpatient pulmonology(Dr. Meade) after treatment of pneumonia.  08/07/24 pt to complete course of duricef 1000 mg bid x 5 days. She received  5 days of IV Rocephin . She will need to f/u with outpatient pulmonology for further workup of lung mass seen on CT chest 08-02-2024    Cirrhosis of liver (HCC) 08/06/24 refer pt to out patient GI for further workup. Continue with lasix  and aldcatone  08/07/24 cytology results still pending at time of discharge. Outpatient referral made to [facility] for further workup of her ascites.     Ascites Prior to 08/06/24 had paracentesis on 08-03-2024. Didn't meet strict criteria for SBP. Absolute PMN 224. But pt was partially treated for SBP. She did not receive IV albumin. Pt does not carry a prior  diagnosis of ascites. Pt did drink wine on a daily basis for several decades but never more than 2-3 drinks per day according to pt's son.  08/06/24 cytology is still pending. Ascites cultures are negative. Will treat with po duricef. Refer her to outpatient GI for f/u. Continue with lasix  and aldcatone  08/07/24 cytology results still pending at time of discharge. Outpatient referral made to [facility] for further workup of her ascites.    Acute pulmonary embolism without acute cor pulmonale (HCC) Prior to 08/06/24 seen on CTPA 08-02-2024. Started on eliquis   08/06/24 pt will need 6 months of treatment with Eliquis .  She can f/u with outpatient pulmonology if longer duration is needed.  08/07/24 continue with po eliquis  for 6 months.    Acquired hypothyroidism Prior to 08/06/24 continue with synthroid   08/06/24 stable on synthroid .  08/07/24 continue synthroid .    Essential hypertension Prior to 08/06/24 BP meds held due to diuresis.   08/06/24 will stop ARB, chlorthalidone at discharge. Continue with lasix  and aldactone .  08/07/24 stop ARB and chlorthalidone. Continue with lasix  and aldactone .    Chronic obstructive pulmonary disease (HCC) Prior to 08/06/24 pt follow with Dr. Meade with [facility] pulmonology. She did not have a COPD exacerbation. Home nebulizer machine ordered. Pt will f/u with outpatient pulmonology for f/u of potential lung mass and how best to proceed with diagnostic workup.  08/06/24 f/u with outpatient pulmonology.  08/07/24 referral made to outpatient pulmonology.    DVT prophylaxis:  apixaban  (ELIQUIS ) tablet 10 mg  apixaban  (ELIQUIS ) tablet 5 mg     Code Status: Full Code Family Communication: pt is decisional. Talked with pt's son chris at bedside yesterday Disposition Plan: home Reason for continuing need for hospitalization: stable for DC today.  Objective: Vitals:   08/07/24 0300 08/07/24 0500 08/07/24 0747 08/07/24 0802  BP: (!) 144/86   139/77   Pulse: 78  77 77  Resp: 18  18 18   Temp: 98.4 F (36.9 C)  (!) 97.4 F (36.3 C)   TempSrc: Oral     SpO2: 98%  100% 97%  Weight:  55.2 kg    Height:        Intake/Output Summary (Last 24 hours) at 08/07/2024 0843 Last data filed at 08/07/2024 0334 Gross per 24 hour  Intake 510 ml  Output 400 ml  Net 110 ml   Filed Weights   08/04/24 0500 08/06/24 0500 08/07/24 0500  Weight: 61.7 kg 54.7 kg 55.2 kg    Examination:  Physical Exam Vitals and nursing note reviewed.  Constitutional:      General: She is not in acute distress.    Appearance: She is normal weight. She is not toxic-appearing.  HENT:     Head: Normocephalic and atraumatic.  Cardiovascular:     Rate and Rhythm: Normal rate and regular rhythm.  Pulmonary:  Effort: Pulmonary effort is normal.  Abdominal:     General: Abdomen is flat. There is no distension.     Palpations: Abdomen is soft.     Comments: +splenomegaly. 4 cm below left costal margin  Musculoskeletal:     Comments: Trace to +1 bilateral ankle and pedal edema. Improved from yesterday  Skin:    General: Skin is warm and dry.  Neurological:     Mental Status: She is alert.    Data Reviewed: I have personally reviewed following labs and imaging studies  CBC: Recent Labs  Lab 08/02/24 1601 08/02/24 1608 08/03/24 0527 08/04/24 0448 08/05/24 0505 08/06/24 0509  WBC 15.5*  --  14.8* 15.1* 15.1* 15.3*  NEUTROABS 12.6*  --   --  12.0* 12.5*  --   HGB 14.5 15.6* 14.0 12.1 12.6 13.0  HCT 43.0 46.0 42.4 36.0 37.1 37.7  MCV 95.1  --  94.4 94.0 92.5 92.2  PLT 661*  --  686* 557* 608* 648*   Basic Metabolic Panel: Recent Labs  Lab 07/31/24 1601 08/02/24 1601 08/02/24 1608 08/03/24 0527 08/04/24 0448 08/05/24 0505  NA 130* 132* 133* 135 132* 133*  K 3.2* 3.1* 3.0* 3.6 3.1* 3.9  CL 90* 88* 89* 94* 93* 95*  CO2 32 28  --  29 29 27   GLUCOSE 125* 117* 121* 98 104* 96  BUN 21 17 20 15 13 10   CREATININE 0.86 0.79 0.90 0.88 0.85  0.77  CALCIUM 9.4 9.1  --  8.9 8.1* 8.1*  MG  --   --   --  2.0 1.8 1.7  PHOS  --   --   --   --  2.7 2.2*   GFR: Estimated Creatinine Clearance: 48.9 mL/min (by C-G formula based on SCr of 0.77 mg/dL). Liver Function Tests: Recent Labs  Lab 08/02/24 1601 08/03/24 0527 08/04/24 0448 08/05/24 0505  AST 25 24 21 29   ALT 18 17 15 19   ALKPHOS 189* 180* 150* 187*  BILITOT 0.8 0.6 0.6 0.5  PROT 6.5 6.2* 5.1* 5.5*  ALBUMIN 3.0* 2.8* 2.3* 2.4*   Recent Labs  Lab 08/02/24 1601  LIPASE 485*   Recent Labs  Lab 08/02/24 1925  AMMONIA 29   Coagulation Profile: Recent Labs  Lab 08/02/24 1916  INR 1.0   BNP (last 3 results) Recent Labs    08/02/24 1626 08/03/24 0527 08/05/24 0505  BNP 48.8 94.6 65.7   Sepsis Labs: Recent Labs  Lab 08/02/24 1609 08/02/24 1926  LATICACIDVEN 1.5 1.3    Recent Results (from the past 240 hours)  Resp panel by RT-PCR (RSV, Flu A&B, Covid) Anterior Nasal Swab     Status: None   Collection Time: 08/02/24  4:26 PM   Specimen: Anterior Nasal Swab  Result Value Ref Range Status   SARS Coronavirus 2 by RT PCR NEGATIVE NEGATIVE Final   Influenza A by PCR NEGATIVE NEGATIVE Final   Influenza B by PCR NEGATIVE NEGATIVE Final    Comment: (NOTE) The Xpert Xpress SARS-CoV-2/FLU/RSV plus assay is intended as an aid in the diagnosis of influenza from Nasopharyngeal swab specimens and should not be used as a sole basis for treatment. Nasal washings and aspirates are unacceptable for Xpert Xpress SARS-CoV-2/FLU/RSV testing.  Fact Sheet for Patients: BloggerCourse.com  Fact Sheet for Healthcare Providers: SeriousBroker.it  This test is not yet approved or cleared by the [place]  FDA and has been authorized for detection and/or diagnosis of SARS-CoV-2 by FDA under an Emergency Use Authorization (  EUA). This EUA will remain in effect (meaning this test can be used) for the duration of  the COVID-19 declaration under Section 564(b)(1) of the Act, 21 U.S.C. section 360bbb-3(b)(1), unless the authorization is terminated or revoked.     Resp Syncytial Virus by PCR NEGATIVE NEGATIVE Final    Comment: (NOTE) Fact Sheet for Patients: BloggerCourse.com  Fact Sheet for Healthcare Providers: SeriousBroker.it  This test is not yet approved or cleared by the [place]  FDA and has been authorized for detection and/or diagnosis of SARS-CoV-2 by FDA under an Emergency Use Authorization (EUA). This EUA will remain in effect (meaning this test can be used) for the duration of the COVID-19 declaration under Section 564(b)(1) of the Act, 21 U.S.C. section 360bbb-3(b)(1), unless the authorization is terminated or revoked.  Performed at [facility], [address] [place], [place] [address]   Blood culture (routine x 2)     Status: None   Collection Time: 08/02/24  7:16 PM   Specimen: BLOOD RIGHT ARM  Result Value Ref Range Status   Specimen Description BLOOD RIGHT ARM  Final   Special Requests   Final    BOTTLES DRAWN AEROBIC AND ANAEROBIC Blood Culture adequate volume   Culture   Final    NO GROWTH 5 DAYS Performed at [facility], [address], [place], [place] [address]    Report Status 08/07/2024 FINAL  Final  Blood culture (routine x 2)     Status: None (Preliminary result)   Collection Time: 08/02/24 10:45 PM   Specimen: BLOOD RIGHT HAND  Result Value Ref Range Status   Specimen Description BLOOD RIGHT HAND  Final   Special Requests   Final    BOTTLES DRAWN AEROBIC AND ANAEROBIC Blood Culture adequate volume   Culture   Final    NO GROWTH 4 DAYS Performed at [facility], [address] [place], [place] [address]    Report Status PENDING  Incomplete  Body fluid culture w Gram Stain     Status: None   Collection Time: 08/03/24 10:46 AM   Specimen: Abdomen; Peritoneal Fluid  Result Value  Ref Range Status   Specimen Description PERITONEAL  Final   Special Requests ABDOMEN  Final   Gram Stain NO WBC SEEN RARE GRAM POSITIVE COCCI   Final   Culture   Final    NO GROWTH 3 DAYS Performed at [facility], [address], [place], [place] [address]    Report Status 08/06/2024 FINAL  Final     Radiology Studies: IR ABDOMEN US  LIMITED Result Date: 08/06/2024 INDICATION: 78 year old female. History of breast cancer. Found to have ascites. Request is for a diagnostic paracentesis. EXAM: ULTRASOUND ABDOMEN LIMITED FINDINGS: Imaging of all 4 quadrants of the abdomen reveal small amount ascites in the right upper quadrant. Trace amounts in the other 3 quadrants IMPRESSION: Small amount of ascites seen on ultrasound. After discussion of the risks versus benefits of the procedure the patient decided to defer paracentesis at this time. Read by: Delon Beagle, NP Electronically Signed   By: Ester Sides M.D.   On: 08/06/2024 14:31   DG CHEST PORT 1 VIEW Result Date: 08/05/2024 CLINICAL DATA:  Shortness of breath EXAM: PORTABLE CHEST 1 VIEW COMPARISON:  08/02/2024 FINDINGS: Heart and mediastinal contours are within normal limits. Bilateral lower lobe airspace disease again noted, similar to prior study. No visible effusions or pneumothorax. No acute bony abnormality. IMPRESSION: Continued bilateral lower lobe airspace  opacities, similar to prior study. Electronically Signed   By: Franky Crease M.D.   On: 08/05/2024 20:40    Scheduled Meds:  apixaban   10 mg Oral BID   Followed by   NOREEN ON 08/12/2024] apixaban   5 mg Oral BID   budesonide -glycopyrrolate -formoterol   2 puff Inhalation BID   levothyroxine   100 mcg Oral q morning   sodium chloride  flush  3 mL Intravenous Q12H   spironolactone   50 mg Oral Daily   Continuous Infusions:   LOS: 4 days   Time spent: 55 minutes  Camellia Door, DO  [facility]  08/07/2024, 8:43 AM

## 2024-08-07 NOTE — Progress Notes (Signed)
 Occupational Therapy Treatment Patient Details Name: Kara Newman MRN: [id number] DOB: Mar 19, 1946 Today's Date: 08/07/2024   History of present illness 78 y.o. female presented to the ED 9/20 for evaluation of shortness of breath, abdominal and lower extremity swelling. Admitted with acute hypoxic respiratory failure, large volume ascites, imaging findings concerning for malignant process. S/p 9/21 paracentesis from R lower quadrant yielding 3L. EFY:RNEI, left breast cancer s/p mastectomy, HTN, HLD, hypothyroidism   OT comments  Pt is making steady progress towards their acute OT goals with plans to discharge home today. Pt reported some concern about shower safety with tub shower on the 1st level of her home. Educated on use of tub bench with a visual demonstration. Pt declined acute practice. Pt states that she [place] and that she will have 24/7 assist from her family if needed. OT to continue to follow acutely to facilitate progress towards established goals to discharge without need for follow up OT.       If plan is discharge home, recommend the following:  Assistance with cooking/housework;Assist for transportation;Help with stairs or ramp for entrance   Equipment Recommendations  None recommended by OT       Precautions / Restrictions Precautions Precautions: Fall Precaution/Restrictions Comments: history of fall 6 days ago Restrictions Weight Bearing Restrictions Per Provider Order: No       Mobility Bed Mobility Overal bed mobility: Needs Assistance Bed Mobility: Sit to Supine       Sit to supine: Supervision        Transfers Overall transfer level: Needs assistance Equipment used: Rolling walker (2 wheels) Transfers: Sit to/from Stand Sit to Stand: Supervision                 Balance Overall balance assessment: Mild deficits observed, not formally tested               ADL either performed or  assessed with clinical judgement   ADL Overall ADL's : Needs assistance/impaired                         Toilet Transfer: Set up;Ambulation;Rolling walker (2 wheels)       Tub/ Shower Transfer: Tub transfer;Tub Building surveyor Details (indicate cue type and reason): pt declined practice but said she is worried about shower safety. Pt educated on use of a Tub transfer bench Functional mobility during ADLs: Supervision/safety;Rolling walker (2 wheels) General ADL Comments: briefly introduced a rollator, pt declined due to not wanting to bang up her walls at home. Pt woul benefit rfom rollator for activity tolerance and safety    Extremity/Trunk Assessment Upper Extremity Assessment Upper Extremity Assessment: Generalized weakness   Lower Extremity Assessment Lower Extremity Assessment: Defer to PT evaluation           Perception Perception Perception: Within Functional Limits   Praxis Praxis Praxis: [facility]   Communication Communication Communication: No apparent difficulties   Cognition Arousal: Alert Behavior During Therapy: WFL for tasks assessed/performed Cognition: No apparent impairments             OT - Cognition Comments: overall WFL, however with discussion of home safety, fall prevention and energy conservation pt demonstrated limited insight and application. Pt initally stated she will not have 24/7 assist at home, and then later stated that someone will always be with her  Following commands: Intact        Cueing   Cueing Techniques: Verbal cues        General Comments VSS    Pertinent Vitals/ Pain       Pain Assessment Pain Assessment: Faces Faces Pain Scale: Hurts a little bit Pain Location: generalized discomfort Pain Descriptors / Indicators: Tightness, Pressure Pain Intervention(s): Limited activity within patient's tolerance, Monitored during session   Frequency  Min 2X/week        Progress  Toward Goals  OT Goals(current goals can now be found in the care plan section)  Progress towards OT goals: Progressing toward goals  Acute Rehab OT Goals Patient Stated Goal: home today OT Goal Formulation: With patient Time For Goal Achievement: 08/18/24 Potential to Achieve Goals: Good ADL Goals Pt Will Perform Grooming: with modified independence;standing Pt Will Perform Lower Body Dressing: with modified independence;sit to/from stand Pt Will Transfer to Toilet: with modified independence Additional ADL Goal #1: pt will complete at least 8 minutes OOB activity to demonstrate improved tolerance for ADLs  Plan      AM-PAC OT 6 Clicks Daily Activity     Outcome Measure   Help from another person eating meals?: None Help from another person taking care of personal grooming?: A Little Help from another person toileting, which includes using toliet, bedpan, or urinal?: A Little Help from another person bathing (including washing, rinsing, drying)?: A Little Help from another person to put on and taking off regular upper body clothing?: A Little Help from another person to put on and taking off regular lower body clothing?: A Little 6 Click Score: 19    End of Session Equipment Utilized During Treatment: Oxygen  OT Visit Diagnosis: Unsteadiness on feet (R26.81);Other abnormalities of gait and mobility (R26.89);Muscle weakness (generalized) (M62.81)   Activity Tolerance Patient limited by fatigue   Patient Left in bed;with call bell/phone within reach;with bed alarm set   Nurse Communication Mobility status        Time: 9150-9094 OT Time Calculation (min): 16 min  Charges: OT General Charges $OT Visit: 1 Visit OT Treatments $Therapeutic Activity: 8-22 mins  Lucie Kendall, OTR/L Acute Rehabilitation Services Office [phone] Secure Chat Communication Preferred   Lucie JONETTA Kendall 08/07/2024, 9:26 AM

## 2024-08-07 NOTE — TOC Transition Note (Signed)
 Transition of Care [facility]) - Discharge Note   Patient Details  Name: Kara Newman MRN: [id number] Date of Birth: Jun 24, 1946  Transition of Care [facility]) CM/SW Contact:  Waddell Barnie Rama, RN Phone Number: 08/07/2024, 9:00 AM   Clinical Narrative:    For dc today, will add HHRN, NCM notified [place] with [place]. She has the DME at the home already , which was taken by son.   Final next level of care: Home w Home Health Services Barriers to Discharge: Continued Medical Work up   Patient Goals and CMS Choice Patient states their goals for this hospitalization and ongoing recovery are:: get home   Choice offered to / list presented to : Adult Children (Son- [name])      Discharge Placement                       Discharge Plan and Services Additional resources added to the After Visit Summary for   In-house Referral: NA Discharge Planning Services: CM Consult Post Acute Care Choice: Durable Medical Equipment          DME Arranged: 3-N-1 DME Agency: [employer] Date DME Agency Contacted: 08/04/24 Time DME Agency Contacted: [phone] Representative spoke with at DME Agency: London Budge HH Arranged: PT HH Agency: [facility] Date [facility] Agency Contacted: 08/04/24 Time HH Agency Contacted: [phone] Representative spoke with at [facility] Agency: Darleene Gowda  Social Drivers of Health (SDOH) Interventions SDOH Screenings   Food Insecurity: No Food Insecurity (08/02/2024)  Housing: Low Risk  (08/02/2024)  Transportation Needs: No Transportation Needs (08/02/2024)  Utilities: Not At Risk (08/02/2024)  Social Connections: Moderately Isolated (08/02/2024)  Tobacco Use: Medium Risk (08/02/2024)     Readmission Risk Interventions     No data to display

## 2024-08-07 NOTE — Assessment & Plan Note (Signed)
 08/07/24 pt will need BMP in PCP office on Monday, August 11, 2024 to make sure she is not over-diuresed.

## 2024-08-07 NOTE — Discharge Summary (Signed)
 [facility] Phys[facility] Discharge Summary   Patient name: Kara Newman  Admit date:     08/02/2024  Discharge date: 08/07/2024  Attending Physician: PERRI MICKEY DELENA MELITON [[id number]]  Discharge Physician: Camellia Door   PCP: Elliot Charm  Admitted From: Home  Disposition:  Home  Recommendations for Outpatient Follow-up:  Follow up with PCP in 1-2 weeks Outpatient referral made to GI for further workup up ascites Outpatient referral made to pulmonology for further workup of lung mass Pt will need repeat BMP on 08-11-2024 to evaluate renal function and decide if continued daily lasix  is indicated Please follow up on the following pending results: paracentesis cytology, paracentesis culture  Home Health:Yes. Home PT Equipment/Devices: potty chair shower chair Home nebulizer machine  Pt already has home O2  Discharge Condition:Stable CODE STATUS:FULL Diet recommendation: Heart Healthy Fluid Restriction: None  Hospital Summary: CC: edema, abd distension HPI: Kara Newman is a 78 y.o. female with medical history significant for COPD, left breast cancer s/p mastectomy, HTN, HLD, hypothyroidism who presented to the ED for evaluation of shortness of breath, abdominal and lower extremity swelling.   Patient states that about 3 weeks ago she developed progressive shortness of breath.  Worse with exertion and when lying flat.  Since then she has had new supplemental O2 requirement of 3 L via [place].  She also has noticed increased swelling of her abdomen during this time.  About 4 days ago she noticed swelling in both of her legs.  She denies significant cough.   She was seen in the pulmonology clinic on 07/24/2024.  There was concern for volume overload due to possible congestive heart failure.  She was started on Lasix  20 mg for 5-day course.  Echocardiogram was ordered and performed on 07/30/2024.  It showed EF 60-65%, no LV RWMA, G1DD, normal RV systolic function, moderately  elevated PA systolic pressure.   Patient states that she took the diuretic as prescribed but did not notice any significant change in her dyspnea.  She has not had any chest pain, nausea, vomiting, abdominal pain.   ED Course  Labs/Imaging on admission: I have personally reviewed following labs and imaging studies.   Initial vitals showed BP 151/86, pulse 94, RR 23, temp 97.8 F, SpO2 93% on 3 L O2 via [place].   Labs showed sodium 132, potassium 3.1, bicarb 28, BUN 17, creatinine 0.79, serum glucose 117, AST 25, ALT 18, alk phos 189, total bilirubin 0.8, lactic acid 1.5, BNP 48.8, WBC 15.5, hemoglobin 14.5, platelets 661, troponin 13.   SARS-CoV-2, influenza, RSV PCR negative.  Blood cultures ordered and pending.   Portable chest x-ray showed increased interstitial markings overlying bilateral lower lung zones with new bilateral small pleural effusions.  Retrocardiac opacities partially obscuring the left medial hemidiaphragm.   CT abdomen/pelvis with contrast showed extensive peripheral nodular airspace disease throughout both lower lungs with small bilateral pleural effusions which could reflect infectious or neoplastic process.  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  Large volume ascites.  Abnormal infiltrative/nodular appearance of the omentum and the anterior abdomen, cannot exclude peritoneal malignancy/carcinomatosis.  Left colonic diverticulosis without diverticulitis.   CTA chest showed linear segmental PE in the left lung, favored to be chronic.  No evidence of right heart strain.  Multifocal patchy opacities in the lungs bilaterally, some of which may reflect infection/pneumonia although the appearance is concerning for underlying multifocal tumor.  Small bilateral pleural effusions, right greater than left also noted.   Patient was given  IV ceftriaxone  and azithromycin .  The hospitalist service was consulted for admission.  Significant Events: Admitted 08/02/2024 for  acute respiratory failure with hypoxia, ascites   Admission Labs: WBC 15.5, HgB 14.5, plt 661 Na 132, K 3.1, CO2 of 28, BUN 17, Scr 0.79, glu 117 T. Prot 6.5, alb 3.0, AST 26, ALT 18, alk phos 189, t. Bili 0.8 Lipase 485 BNP 48 Covid/rsv/flu Negative UA spg >1.046, negative nitrite, negative LE Troponin I 15, 13 NH3 of 29  Admission Imaging Studies: CXR Findings favor congestive heart failure/pulmonary edema. 2. There are retrocardiac opacities partially obscuring the left medial hemidiaphragm, which may represent left lung atelectasis and/or consolidation. CT abd/pelvis Extensive peripheral nodular airspace disease throughout both lower lungs with small bilateral pleural effusions. This could reflect infectious or neoplastic process. Insert further evaluation with full dedicated chest CT with IV contrast. 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 Large volume ascites. Abnormal infiltrated/nodular appearance of the omentum in the anterior abdomen. Cannot exclude peritoneal malignancy/carcinomatosis. Left colonic diverticulosis.  No active diverticulitis. Aortic atherosclerosis CTPA Linear segmental pulmonary embolism in the left lung, favored to be chronic. No evidence of right heart strain. 2. Multifocal patchy opacities in the lungs bilaterally, some of which may reflect infection/pneumonia, although the appearance is concerning for underlying multifocal tumor. Follow-up CT chest is suggested in 3-4 weeks after appropriate antimicrobial therapy. 3. Small bilateral pleural effusions, right greater than left.  Significant Labs: Strep pneumo antigen Negative Legionella antigen Negative 08-03-2024 paracentesis cell count, total nucleated cells 477, 47% neutrophils, absolute neutrophil count 224 Ascites gram stain rare GPC  Significant Imaging Studies: 08-05-2024 CXR Continued bilateral lower lobe airspace opacities, similar to prior study.  Antibiotic Therapy: Anti-infectives  (From admission, onward)    Start     Dose/Rate Route Frequency Ordered Stop   08/04/24 1900  cefTRIAXone  (ROCEPHIN ) 2 g in sodium chloride  0.9 % 100 mL IVPB        2 g 200 mL/hr over 30 Minutes Intravenous Every 24 hours 08/04/24 0812 08/06/24 2359   08/03/24 1900  azithromycin  (ZITHROMAX ) 500 mg in sodium chloride  0.9 % 250 mL IVPB        500 mg 250 mL/hr over 60 Minutes Intravenous Every 24 hours 08/02/24 2119 08/04/24 2359   08/03/24 1900  cefTRIAXone  (ROCEPHIN ) 1 g in sodium chloride  0.9 % 100 mL IVPB  Status:  Discontinued        1 g 200 mL/hr over 30 Minutes Intravenous Every 24 hours 08/02/24 2119 08/04/24 0812   08/02/24 1900  cefTRIAXone  (ROCEPHIN ) 1 g in sodium chloride  0.9 % 100 mL IVPB        1 g 200 mL/hr over 30 Minutes Intravenous  Once 08/02/24 1851 08/02/24 2100   08/02/24 1900  azithromycin  (ZITHROMAX ) 500 mg in sodium chloride  0.9 % 250 mL IVPB        500 mg 250 mL/hr over 60 Minutes Intravenous  Once 08/02/24 1851 08/02/24 2245       Procedures: 08-03-2024 paracentesis for 3 liters  Consultants:    Hospital Course by Problem: * Acute on chronic respiratory failure with hypoxia (HCC) - home O2 @ 2 L/min. Prior to 08/06/24 pt on 2 L/min at home. Was on 3 L/min prior to diuresis and treatment for pneumonia. Caused by pneumonia.  08/06/24 O2 weaned back down to 2 L/min.  08/07/24 resolved acute hypoxic respiratory failure. Pt back down to her baseline 2-3 L/min O2. I reminded her that she needs to tell her son  to bring portable O2 tank to hospital for her discharge today.    Bilateral leg edema 08/07/24 pt will need BMP in PCP office on Monday, August 11, 2024 to make sure she is not over-diuresed.   Right upper lobe pneumonia Prior to 08/06/24 on IV rocephin .   08/06/24 Change to po duricef 1000 mg bid. Due concern for lung mass on CTPA, pt will need to f/u outpatient pulmonology(Dr. Meade) after treatment of pneumonia.  08/07/24 pt to complete  course of duricef 1000 mg bid x 5 days. She received 5 days of IV Rocephin . She will need to f/u with outpatient pulmonology for further workup of lung mass seen on CT chest 08-02-2024    Cirrhosis of liver (HCC) 08/06/24 refer pt to out patient GI for further workup. Continue with lasix  and aldcatone  08/07/24 cytology results still pending at time of discharge. Outpatient referral made to [facility] for further workup of her ascites.     Ascites Prior to 08/06/24 had paracentesis on 08-03-2024. Didn't meet strict criteria for SBP. Absolute PMN 224. But pt was partially treated for SBP. She did not receive IV albumin. Pt does not carry a prior diagnosis of ascites. Pt did drink wine on a daily basis for several decades but never more than 2-3 drinks per day according to pt's son.  08/06/24 cytology is still pending. Ascites cultures are negative. Will treat with po duricef. Refer her to outpatient GI for f/u. Continue with lasix  and aldcatone  08/07/24 cytology results still pending at time of discharge. Outpatient referral made to [facility] for further workup of her ascites.    Acute pulmonary embolism without acute cor pulmonale (HCC) Prior to 08/06/24 seen on CTPA 08-02-2024. Started on eliquis   08/06/24 pt will need 6 months of treatment with Eliquis .  She can f/u with outpatient pulmonology if longer duration is needed.  08/07/24 continue with po eliquis  for 6 months.    Acquired hypothyroidism Prior to 08/06/24 continue with synthroid   08/06/24 stable on synthroid .  08/07/24 continue synthroid .    Essential hypertension Prior to 08/06/24 BP meds held due to diuresis.   08/06/24 will stop ARB, chlorthalidone at discharge. Continue with lasix  and aldactone .  08/07/24 stop ARB and chlorthalidone. Continue with lasix  and aldactone .    Chronic obstructive pulmonary disease (HCC) Prior to 08/06/24 pt follow with Dr. Meade with [facility] pulmonology. She did not have a COPD  exacerbation. Home nebulizer machine ordered. Pt will f/u with outpatient pulmonology for f/u of potential lung mass and how best to proceed with diagnostic workup.  08/06/24 f/u with outpatient pulmonology.  08/07/24 referral made to outpatient pulmonology.      Discharge Diagnoses:  Principal Problem:   Acute on chronic respiratory failure with hypoxia (HCC) - home O2 @ 2 L/min. Active Problems:   Right upper lobe pneumonia   Bilateral leg edema   Acute pulmonary embolism without acute cor pulmonale (HCC)   Ascites   Cirrhosis of liver (HCC)   Chronic obstructive pulmonary disease (HCC)   Essential hypertension   Acquired hypothyroidism   Discharge Instructions  Discharge Instructions     Ambulatory referral to Gastroenterology   Complete by: As directed    Ascites. Paracentesis cytology pending. Possible cirrhosis.   What is the reason for referral?: Other   Call MD for:  difficulty breathing, headache or visual disturbances   Complete by: As directed    Call MD for:  extreme fatigue   Complete by: As directed  Call MD for:  hives   Complete by: As directed    Call MD for:  persistant dizziness or light-headedness   Complete by: As directed    Call MD for:  persistant nausea and vomiting   Complete by: As directed    Call MD for:  redness, tenderness, or signs of infection (pain, swelling, redness, odor or green/yellow discharge around incision site)   Complete by: As directed    Call MD for:  severe uncontrolled pain   Complete by: As directed    Call MD for:  temperature >100.4   Complete by: As directed    Diet - low sodium heart healthy   Complete by: As directed    Discharge instructions   Complete by: As directed    1. Follow up with your primary care provider in 1-2 weeks following discharge from hospital. 2. Follow up with your PCP office on Monday, August 11, 2024 for blood work to check your kidney function 3. Outpatient referral sent to liver  specialist to evaluate the fluid in her abdomen 4. Outpatient referral sent to your lung doctor to further evaluate your pneumonia vs mass on your CT scan   Increase activity slowly   Complete by: As directed    Pulmonary Visit   Complete by: As directed    Reason for referral: Lung Mass/Lung Nodule   Does the patient's CT scan have any urgent finds? ("lung mass" (3+cm), suspected "metastatic disease," nodules >40mm with high-risk characteristics such as "spiculation, pleural tenting, adenopathy," or non-calcified nodule >38mm): Yes      Allergies as of 08/07/2024       Reactions   Ace Inhibitors    Other reaction(s): Cough (ALLERGY/intolerance)   Benzalkonium Chloride Other (See Comments)   Other reaction(s): Other (See Comments), Other (See Comments) Caused an infection Caused an infection   Levothyroxine  Sodium Other (See Comments)   Other reaction(s): troubles in the past, wants brand Synthroid    Latex Rash   Sulfa Antibiotics Other (See Comments)   Patient cannot recall if she has an allergy.  She does not take this medication due to it being ineffective.         Medication List     STOP taking these medications    amLODipine 5 MG tablet Commonly known as: NORVASC   chlorthalidone 25 MG tablet Commonly known as: HYGROTON   losartan 25 MG tablet Commonly known as: COZAAR       TAKE these medications    acetaminophen  500 MG tablet Commonly known as: TYLENOL  Take 500 mg by mouth every 6 (six) hours as needed for mild pain (pain score 1-3) or headache.   albuterol  108 (90 Base) MCG/ACT inhaler Commonly known as: Ventolin  HFA Inhale 2 puffs into the lungs every 4 (four) hours as needed for wheezing or shortness of breath.   Apixaban  Starter Pack (10mg  and 5mg ) Commonly known as: ELIQUIS  STARTER PACK Take as directed on package: start with two-5mg  tablets twice daily for 7 days. On day 8, switch to one-5mg  tablet twice daily.   Breztri  Aerosphere 160-9-4.8  MCG/ACT Aero inhaler Generic drug: budesonide -glycopyrrolate -formoterol  Inhale 2 puffs into the lungs in the morning and at bedtime.   cefadroxil  500 MG capsule Commonly known as: DURICEF Take 2 capsules (1,000 mg total) by mouth 2 (two) times daily for 5 days.   Cholecalciferol 50 MCG (2000 UT) Caps Take 2,000 Units by mouth in the morning.   furosemide  40 MG tablet Commonly known as: Lasix  Take  1 tablet (40 mg total) by mouth daily.   ipratropium-albuterol  0.5-2.5 (3) MG/3ML Soln Commonly known as: DUONEB Take 3 mLs by nebulization every 6 (six) hours as needed.   OXYGEN Inhale 3 L into the lungs at bedtime.   promethazine -dextromethorphan 6.25-15 MG/5ML syrup Commonly known as: PROMETHAZINE -DM Take 5 mLs by mouth 4 (four) times daily as needed for cough.   spironolactone  50 MG tablet Commonly known as: ALDACTONE  Take 1 tablet (50 mg total) by mouth in the morning.   Synthroid  100 MCG tablet Generic drug: levothyroxine  Take 100 mcg by mouth every morning.               Durable Medical Equipment  (From admission, onward)           Start     Ordered   08/06/24 1554  For home use only DME Nebulizer machine  Once       Question Answer Comment  Patient needs a nebulizer to treat with the following condition COPD (chronic obstructive pulmonary disease) (HCC)   Length of Need Lifetime   Additional equipment included Administration kit   Additional equipment included Filter      08/06/24 1554   08/04/24 1251  For home use only DME 3 n 1  Once        08/04/24 1251            Follow-up Information     [facility] Follow up.   Specialty: Home Health Services Why: Agency will call you to set up apt times Contact information: [address] [place] [address] [phone]         [facility] Follow up.   Why: 3 n  1 Contact information: [phone]               Allergies  Allergen Reactions   Ace Inhibitors      Other reaction(s): Cough (ALLERGY/intolerance)   Benzalkonium Chloride Other (See Comments)    Other reaction(s): Other (See Comments), Other (See Comments) Caused an infection Caused an infection    Levothyroxine  Sodium Other (See Comments)    Other reaction(s): troubles in the past, wants brand Synthroid    Latex Rash   Sulfa Antibiotics Other (See Comments)    Patient cannot recall if she has an allergy.  She does not take this medication due to it being ineffective.     Discharge Exam: Vitals:   08/07/24 0747 08/07/24 0802  BP: 139/77   Pulse: 77 77  Resp: 18 18  Temp: (!) 97.4 F (36.3 C)   SpO2: 100% 97%    Physical Exam Vitals and nursing note reviewed.  Constitutional:      General: She is not in acute distress.    Appearance: She is normal weight. She is not toxic-appearing.  HENT:     Head: Normocephalic and atraumatic.  Cardiovascular:     Rate and Rhythm: Normal rate and regular rhythm.  Pulmonary:     Effort: Pulmonary effort is normal.  Abdominal:     General: Abdomen is flat. There is no distension.     Palpations: Abdomen is soft.     Comments: +splenomegaly. 4 cm below left costal margin  Musculoskeletal:     Comments: Trace to +1 bilateral ankle and pedal edema. Improved from yesterday  Skin:    General: Skin is warm and dry.  Neurological:     Mental Status: She is alert.     The results of significant  diagnostics from this hospitalization (including imaging, microbiology, ancillary and laboratory) are listed below for reference.    Microbiology: Recent Results (from the past 240 hours)  Resp panel by RT-PCR (RSV, Flu A&B, Covid) Anterior Nasal Swab     Status: None   Collection Time: 08/02/24  4:26 PM   Specimen: Anterior Nasal Swab  Result Value Ref Range Status   SARS Coronavirus 2 by RT PCR NEGATIVE NEGATIVE Final   Influenza A by PCR NEGATIVE NEGATIVE Final   Influenza B by PCR NEGATIVE NEGATIVE Final    Comment: (NOTE) The Xpert  Xpress SARS-CoV-2/FLU/RSV plus assay is intended as an aid in the diagnosis of influenza from Nasopharyngeal swab specimens and should not be used as a sole basis for treatment. Nasal washings and aspirates are unacceptable for Xpert Xpress SARS-CoV-2/FLU/RSV testing.  Fact Sheet for Patients: BloggerCourse.com  Fact Sheet for Healthcare Providers: SeriousBroker.it  This test is not yet approved or cleared by the [place]  FDA and has been authorized for detection and/or diagnosis of SARS-CoV-2 by FDA under an Emergency Use Authorization (EUA). This EUA will remain in effect (meaning this test can be used) for the duration of the COVID-19 declaration under Section 564(b)(1) of the Act, 21 U.S.C. section 360bbb-3(b)(1), unless the authorization is terminated or revoked.     Resp Syncytial Virus by PCR NEGATIVE NEGATIVE Final    Comment: (NOTE) Fact Sheet for Patients: BloggerCourse.com  Fact Sheet for Healthcare Providers: SeriousBroker.it  This test is not yet approved or cleared by the [place]  FDA and has been authorized for detection and/or diagnosis of SARS-CoV-2 by FDA under an Emergency Use Authorization (EUA). This EUA will remain in effect (meaning this test can be used) for the duration of the COVID-19 declaration under Section 564(b)(1) of the Act, 21 U.S.C. section 360bbb-3(b)(1), unless the authorization is terminated or revoked.  Performed at [facility], [address] [place], [place] [address]   Blood culture (routine x 2)     Status: None   Collection Time: 08/02/24  7:16 PM   Specimen: BLOOD RIGHT ARM  Result Value Ref Range Status   Specimen Description BLOOD RIGHT ARM  Final   Special Requests   Final    BOTTLES DRAWN AEROBIC AND ANAEROBIC Blood Culture adequate volume   Culture   Final    NO GROWTH 5 DAYS Performed at [facility], [address] [place], [place] [address]    Report Status 08/07/2024 FINAL  Final  Blood culture (routine x 2)     Status: None (Preliminary result)   Collection Time: 08/02/24 10:45 PM   Specimen: BLOOD RIGHT HAND  Result Value Ref Range Status   Specimen Description BLOOD RIGHT HAND  Final   Special Requests   Final    BOTTLES DRAWN AEROBIC AND ANAEROBIC Blood Culture adequate volume   Culture   Final    NO GROWTH 4 DAYS Performed at [facility], [address], [place], [place] [address]    Report Status PENDING  Incomplete  Body fluid culture w Gram Stain     Status: None   Collection Time: 08/03/24 10:46 AM   Specimen: Abdomen; Peritoneal Fluid  Result Value Ref Range Status   Specimen Description PERITONEAL  Final   Special Requests ABDOMEN  Final   Gram Stain NO WBC SEEN RARE GRAM POSITIVE COCCI   Final   Culture   Final    NO GROWTH 3 DAYS Performed at  [facility], [address] [place], [place] [address]    Report Status 08/06/2024 FINAL  Final     Labs: BNP (last 3 results) Recent Labs    08/02/24 1626 08/03/24 0527 08/05/24 0505  BNP 48.8 94.6 65.7   Basic Metabolic Panel: Recent Labs  Lab 07/31/24 1601 08/02/24 1601 08/02/24 1608 08/03/24 0527 08/04/24 0448 08/05/24 0505  NA 130* 132* 133* 135 132* 133*  K 3.2* 3.1* 3.0* 3.6 3.1* 3.9  CL 90* 88* 89* 94* 93* 95*  CO2 32 28  --  29 29 27   GLUCOSE 125* 117* 121* 98 104* 96  BUN 21 17 20 15 13 10   CREATININE 0.86 0.79 0.90 0.88 0.85 0.77  CALCIUM 9.4 9.1  --  8.9 8.1* 8.1*  MG  --   --   --  2.0 1.8 1.7  PHOS  --   --   --   --  2.7 2.2*   Liver Function Tests: Recent Labs  Lab 08/02/24 1601 08/03/24 0527 08/04/24 0448 08/05/24 0505  AST 25 24 21 29   ALT 18 17 15 19   ALKPHOS 189* 180* 150* 187*  BILITOT 0.8 0.6 0.6 0.5  PROT 6.5 6.2* 5.1* 5.5*  ALBUMIN 3.0* 2.8* 2.3* 2.4*   Recent Labs  Lab 08/02/24 1601  LIPASE 485*   Recent Labs  Lab 08/02/24 1925   AMMONIA 29   CBC: Recent Labs  Lab 08/02/24 1601 08/02/24 1608 08/03/24 0527 08/04/24 0448 08/05/24 0505 08/06/24 0509  WBC 15.5*  --  14.8* 15.1* 15.1* 15.3*  NEUTROABS 12.6*  --   --  12.0* 12.5*  --   HGB 14.5 15.6* 14.0 12.1 12.6 13.0  HCT 43.0 46.0 42.4 36.0 37.1 37.7  MCV 95.1  --  94.4 94.0 92.5 92.2  PLT 661*  --  686* 557* 608* 648*   BNP: Recent Labs  Lab 08/02/24 1626 08/03/24 0527 08/05/24 0505  BNP 48.8 94.6 65.7   Urinalysis    Component Value Date/Time   COLORURINE YELLOW 08/02/2024 1829   APPEARANCEUR CLEAR 08/02/2024 1829   LABSPEC >1.046 (H) 08/02/2024 1829   PHURINE 5.0 08/02/2024 1829   GLUCOSEU NEGATIVE 08/02/2024 1829   HGBUR NEGATIVE 08/02/2024 1829   BILIRUBINUR NEGATIVE 08/02/2024 1829   KETONESUR NEGATIVE 08/02/2024 1829   PROTEINUR NEGATIVE 08/02/2024 1829   NITRITE NEGATIVE 08/02/2024 1829   LEUKOCYTESUR NEGATIVE 08/02/2024 1829   Sepsis Labs Recent Labs  Lab 08/03/24 0527 08/04/24 0448 08/05/24 0505 08/06/24 0509  WBC 14.8* 15.1* 15.1* 15.3*    Procedures/Studies: IR ABDOMEN US  LIMITED Result Date: 08/06/2024 INDICATION: 78 year old female. History of breast cancer. Found to have ascites. Request is for a diagnostic paracentesis. EXAM: ULTRASOUND ABDOMEN LIMITED FINDINGS: Imaging of all 4 quadrants of the abdomen reveal small amount ascites in the right upper quadrant. Trace amounts in the other 3 quadrants IMPRESSION: Small amount of ascites seen on ultrasound. After discussion of the risks versus benefits of the procedure the patient decided to defer paracentesis at this time. Read by: Delon Beagle, NP Electronically Signed   By: Ester Sides M.D.   On: 08/06/2024 14:31   DG CHEST PORT 1 VIEW Result Date: 08/05/2024 CLINICAL DATA:  Shortness of breath EXAM: PORTABLE CHEST 1 VIEW COMPARISON:  08/02/2024 FINDINGS: Heart and mediastinal contours are within normal limits. Bilateral lower lobe airspace disease again noted,  similar to prior study. No visible effusions or pneumothorax. No acute bony abnormality. IMPRESSION: Continued bilateral lower lobe airspace opacities, similar  to prior study. Electronically Signed   By: Franky Crease M.D.   On: 08/05/2024 20:40   VAS US  LOWER EXTREMITY VENOUS (DVT) Result Date: 08/04/2024  Lower Venous DVT Study Patient Name:  Kara Newman  Date of Exam:   08/04/2024 Medical Rec #: [id number]        Accession #:    [id number] Date of Birth: 1946/05/13        Patient Gender: F Patient Age:   89 years Exam Location:  [facility] Procedure:      VAS US  LOWER EXTREMITY VENOUS (DVT) Referring Phys: A POWELL JR --------------------------------------------------------------------------------  Indications: Pulmonary embolism.  Limitations: Poor ultrasound/tissue interface. Comparison Study: No previous exams Performing Technologist: Jody Hill RVT, RDMS  Examination Guidelines: A complete evaluation includes B-mode imaging, spectral Doppler, color Doppler, and power Doppler as needed of all accessible portions of each vessel. Bilateral testing is considered an integral part of a complete examination. Limited examinations for reoccurring indications may be performed as noted. The reflux portion of the exam is performed with the patient in reverse Trendelenburg.  +---------+---------------+---------+-----------+----------+--------------+ RIGHT    CompressibilityPhasicitySpontaneityPropertiesThrombus Aging +---------+---------------+---------+-----------+----------+--------------+ CFV      Full           Yes      Yes                                 +---------+---------------+---------+-----------+----------+--------------+ SFJ      Full                                                        +---------+---------------+---------+-----------+----------+--------------+ FV Prox  Full           Yes      Yes                                  +---------+---------------+---------+-----------+----------+--------------+ FV Mid   Full           Yes      Yes                                 +---------+---------------+---------+-----------+----------+--------------+ FV DistalFull           Yes      Yes                                 +---------+---------------+---------+-----------+----------+--------------+ PFV      Full                                                        +---------+---------------+---------+-----------+----------+--------------+ POP      Full           Yes      Yes                                 +---------+---------------+---------+-----------+----------+--------------+ PTV  Full                                                        +---------+---------------+---------+-----------+----------+--------------+ PERO     Full                                                        +---------+---------------+---------+-----------+----------+--------------+ Gastroc  None           No       No                   Acute          +---------+---------------+---------+-----------+----------+--------------+ IM calf  None           No       No                   Acute          +---------+---------------+---------+-----------+----------+--------------+   +---------+---------------+---------+-----------+----------+--------------+ LEFT     CompressibilityPhasicitySpontaneityPropertiesThrombus Aging +---------+---------------+---------+-----------+----------+--------------+ CFV      Full           Yes      Yes                                 +---------+---------------+---------+-----------+----------+--------------+ SFJ      Full                                                        +---------+---------------+---------+-----------+----------+--------------+ FV Prox  Full           Yes      Yes                                  +---------+---------------+---------+-----------+----------+--------------+ FV Mid   Full           Yes      Yes                                 +---------+---------------+---------+-----------+----------+--------------+ FV DistalFull           Yes      Yes                                 +---------+---------------+---------+-----------+----------+--------------+ PFV      Full                                                        +---------+---------------+---------+-----------+----------+--------------+ POP      Full           Yes      Yes                                 +---------+---------------+---------+-----------+----------+--------------+  Gastroc  None           No       No                   Acute          +---------+---------------+---------+-----------+----------+--------------+     Summary: BILATERAL: - No evidence of deep vein thrombosis seen in the lower extremities, bilaterally. -No evidence of popliteal cyst, bilaterally. RIGHT: Findings consistent with acute intramuscular thrombosis involving the right gastrocnemius veins, and right intramuscular calf vein.  LEFT: Findings consistent with acute intramuscular thrombosis involving the left gastrocnemius veins.  *See table(s) above for measurements and observations. Electronically signed by Debby Robertson on 08/04/2024 at 4:48:28 PM.    Final    US  Paracentesis Result Date: 08/03/2024 INDICATION: Patient with ascites of uncertain etiology presents today for a diagnostic and therapeutic paracentesis. EXAM: ULTRASOUND GUIDED PARACENTESIS MEDICATIONS: 1% lidocaine  10 mL COMPLICATIONS: None immediate. PROCEDURE: Informed written consent was obtained from the patient after a discussion of the risks, benefits and alternatives to treatment. A timeout was performed prior to the initiation of the procedure. Initial ultrasound scanning demonstrates a large amount of ascites within the right lower abdominal quadrant. The right  lower abdomen was prepped and draped in the usual sterile fashion. 1% lidocaine  was used for local anesthesia. Following this, a 19 gauge, 7-cm, Yueh catheter was introduced. An ultrasound image was saved for documentation purposes. The paracentesis was performed. The catheter was removed and a dressing was applied. The patient tolerated the procedure well without immediate post procedural complication. FINDINGS: A total of approximately 3 L of clear yellow fluid was removed. Samples were sent to the laboratory as requested by the clinical team. IMPRESSION: Successful ultrasound-guided paracentesis yielding 3 liters of peritoneal fluid. Procedure performed by Warren Dais, NP Electronically Signed   By: Cordella Banner   On: 08/03/2024 10:55   CT Angio Chest PE W and/or Wo Contrast Addendum Date: 08/02/2024  ADDENDUM #1  ADDENDUM: Critical Value/emergent results were called by telephone at the time of interpretation on 08/02/2024 at 2036 hrs to provider Dr Ginger. ---------------------------------------------------- Electronically signed by: Pinkie Pebbles MD 08/02/2024 08:49 PM EDT RP Workstation: [id number]   Result Date: 08/02/2024  ORIGINAL REPORT  EXAM: CTA of the Chest with contrast for PE 08/02/2024 08:10:00 PM TECHNIQUE: CTA of the chest was performed after the administration of intravenous contrast. Multiplanar reformatted images are provided for review. MIP images are provided for review. Automated exposure control, iterative reconstruction, and/or weight based adjustment of the mA/kV was utilized to reduce the radiation dose to as low as reasonably achievable. COMPARISON: Chest radiograph earlier today. CLINICAL HISTORY: Pulmonary embolism (PE) suspected, high prob. Chief complaints; Shortness of Breath; CT Angio Chest PE W and/or Wo Contrast; Pulmonary embolism (PE) suspected, high prob. FINDINGS: PULMONARY ARTERIES: Peripheral linear filling defect at the bifurcation of the distal left main  pulmonary artery (image 61) extending into the segmental lingular pulmonary artery (image 56) as well as the anterior segment left lower lobe (image 85) suggesting mild segmental pulmonary embolism, although possibly chronic given the appearance. MEDIASTINUM: No evidence of right heart strain. Thoracic aortic atherosclerosis. Mild coronary atherosclerosis of the LAD. LYMPH NODES: Small mediastinal lymph nodes which do not need pathologic CT size criteria. LUNGS AND PLEURA: Small bilateral pleural effusions, right greater than left. Multifocal patchy/nodular opacities in the lungs bilaterally, including a 2.4 x 2.0 cm nodular opacity in the anteromedial left upper lobe (image 42) and a 4.3  x 3.2 cm mass like opacity in the inferomedial right upper lobe (image 89). This appearance raises concern for multifocal tumor, less likely multifocal infection/pneumonia. Very mild interlobular septal thickening in the upper lobes but this appearance is not favored to reflect interstitial edema. UPPER ABDOMEN: Upper abdomen is better evaluated on recent CT abdomen/pelvis. 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 Mild degenerative changes of the mid thoracic spine. IMPRESSION: 1. 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 No evidence of right heart strain. 2. Multifocal patchy opacities in the lungs bilaterally, some of which may reflect infection/pneumonia, although the appearance is concerning for underlying multifocal tumor. Follow-up CT chest is suggested in 3-4 weeks after appropriate antimicrobial therapy. 3. Small bilateral pleural effusions, right greater than left. Electronically signed by: Pinkie Pebbles MD 08/02/2024 08:40 PM EDT RP Workstation: [id number]   CT ABDOMEN PELVIS W CONTRAST Result Date: 08/02/2024 CLINICAL DATA:  Bowel obstruction suspected. Distended abdomen. Lower extremity swelling. EXAM: CT ABDOMEN AND PELVIS  WITH CONTRAST TECHNIQUE: Multidetector CT imaging of the abdomen and pelvis was performed using the standard protocol following bolus administration of intravenous contrast. RADIATION DOSE REDUCTION: This exam was performed according to the departmental dose-optimization program which includes automated exposure control, adjustment of the mA and/or kV according to patient size and/or use of iterative reconstruction technique. CONTRAST:  75mL OMNIPAQUE  IOHEXOL  350 MG/ML SOLN COMPARISON:  None Available. FINDINGS: Lower chest: Extensive nodular appearing airspace disease peripherally in both lower lungs. Small bilateral pleural effusions. Heart is normal size. Hepatobiliary: Liver appears shrunken with suggestion of nodular surface suggesting cirrhosis. Recommend clinical correlation. Gallbladder unremarkable. No biliary ductal dilatation. Pancreas: Extensive cystic changes throughout the pancreatic body and tail. No surrounding inflammation or ductal dilatation. Spleen: No focal abnormality.  Normal size. Adrenals/Urinary Tract: No suspicious renal or adrenal abnormality. Small scattered bilateral renal cysts. No stones or hydronephrosis. Urinary bladder unremarkable. Stomach/Bowel: Left colonic diverticulosis. No active diverticulitis. Stomach and small bowel decompressed. Vascular/Lymphatic: Aortic atherosclerosis. No evidence of aneurysm or adenopathy. Reproductive: Uterus appears very small.  No adnexal mass. Other: Large volume ascites in the abdomen and pelvis. There appears to be abnormal infiltrated/nodular appearance of the omentum anteriorly. Musculoskeletal: No acute bony abnormality. IMPRESSION: Extensive peripheral nodular airspace disease throughout both lower lungs with small bilateral pleural effusions. This could reflect infectious or neoplastic process. Insert further evaluation with full dedicated chest CT with IV contrast. 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 Large volume  ascites. Abnormal infiltrated/nodular appearance of the omentum in the anterior abdomen. Cannot exclude peritoneal malignancy/carcinomatosis. Left colonic diverticulosis.  No active diverticulitis. Aortic atherosclerosis. Electronically Signed   By: Franky Crease M.D.   On: 08/02/2024 18:15   DG Chest Portable 1 View Result Date: 08/02/2024 CLINICAL DATA:  SOB, rales, edema. EXAM: PORTABLE CHEST 1 VIEW COMPARISON:  06/05/2024. FINDINGS: Bilateral lungs appear hyperlucent with coarse bronchovascular markings, in keeping with COPD. There are diffuse increased interstitial markings overlying the bilateral lower lung zones with associated new bilateral small pleural effusions, favoring congestive heart failure/pulmonary edema. There are retrocardiac opacities partially obscuring the left medial hemidiaphragm, which may represent left lung atelectasis and/or consolidation. Correlate clinically. Stable cardio-mediastinal silhouette. No acute osseous abnormalities. The soft tissues are within normal limits. IMPRESSION: 1. Findings favor congestive heart failure/pulmonary edema. 2. There are retrocardiac opacities partially obscuring the left medial hemidiaphragm, which may represent left lung atelectasis and/or consolidation. Electronically Signed   By: Ree Molt  M.D.   On: 08/02/2024 16:44   ECHOCARDIOGRAM COMPLETE Result Date: 07/30/2024    ECHOCARDIOGRAM REPORT   Patient Name:   Kara Newman Date of Exam: 07/30/2024 Medical Rec #:  [id number]       Height:       65.0 in Accession #:    [id number]      Weight:       130.6 lb Date of Birth:  [birth year]       BSA:          1.650 m Patient Age:    78 years        BP:           115/69 mmHg Patient Gender: F               HR:           85 bpm. Exam Location:  Outpatient Procedure: 2D Echo, Color Doppler and Cardiac Doppler (Both Spectral and Color            Flow Doppler were utilized during procedure). Indications:    Dyspnea R06.00  History:        Patient has no  prior history of Echocardiogram examinations.                 COPD, Signs/Symptoms:Shortness of Breath and Dyspnea; Risk                 Factors:Hypertension and Former Smoker.  Sonographer:    Koleen Popper RDCS Referring Phys: [id number] COMER LULLA ROULEAU  Sonographer Comments: Image acquisition challenging due to COPD, Image acquisition challenging due to respiratory motion and Image acquisition challenging due to breast implants. IMPRESSIONS  1. Left ventricular ejection fraction, by estimation, is 60 to 65%. The left ventricle has normal function. The left ventricle has no regional wall motion abnormalities. Left ventricular diastolic parameters are consistent with Grade I diastolic dysfunction (impaired relaxation).  2. Right ventricular systolic function is normal. The right ventricular size is mildly enlarged. There is moderately elevated pulmonary artery systolic pressure. The estimated right ventricular systolic pressure is 56.3 mmHg.  3. The mitral valve is normal in structure. No evidence of mitral valve regurgitation. No evidence of mitral stenosis.  4. Tricuspid valve regurgitation is mild to moderate.  5. The aortic valve was not well visualized. There is moderate calcification of the aortic valve. Aortic valve regurgitation is trivial.  6. The inferior vena cava is normal in size with greater than 50% respiratory variability, suggesting right atrial pressure of 3 mmHg.  7. There appears to be ascites present around liver. FINDINGS  Left Ventricle: Left ventricular ejection fraction, by estimation, is 60 to 65%. The left ventricle has normal function. The left ventricle has no regional wall motion abnormalities. The left ventricular internal cavity size was normal in size. There is  no left ventricular hypertrophy. Left ventricular diastolic parameters are consistent with Grade I diastolic dysfunction (impaired relaxation). Right Ventricle: The right ventricular size is mildly enlarged. No increase in  right ventricular wall thickness. Right ventricular systolic function is normal. There is moderately elevated pulmonary artery systolic pressure. The tricuspid regurgitant velocity is 3.65 m/s, and with an assumed right atrial pressure of 3 mmHg, the estimated right ventricular systolic pressure is 56.3 mmHg. Left Atrium: Left atrial size was normal in size. Right Atrium: Right atrial size was normal in size. Pericardium: Trivial pericardial effusion is present. Mitral Valve: The mitral valve is normal in structure. There is mild calcification of  the mitral valve leaflet(s). Mild mitral annular calcification. No evidence of mitral valve regurgitation. No evidence of mitral valve stenosis. Tricuspid Valve: The tricuspid valve is normal in structure. Tricuspid valve regurgitation is mild to moderate. Aortic Valve: The aortic valve was not well visualized. There is moderate calcification of the aortic valve. Aortic valve regurgitation is trivial. Pulmonic Valve: The pulmonic valve was normal in structure. Pulmonic valve regurgitation is not visualized. Aorta: The aortic root is normal in size and structure. Venous: The inferior vena cava is normal in size with greater than 50% respiratory variability, suggesting right atrial pressure of 3 mmHg. IAS/Shunts: No atrial level shunt detected by color flow Doppler.  LEFT VENTRICLE PLAX 2D LVIDd:         3.82 cm     Diastology LVIDs:         2.55 cm     LV e' medial:    6.64 cm/s LV PW:         0.72 cm     LV E/e' medial:  7.1 LV IVS:        0.83 cm     LV e' lateral:   8.70 cm/s LVOT diam:     1.92 cm     LV E/e' lateral: 5.4 LV SV:         48 LV SV Index:   29 LVOT Area:     2.90 cm  LV Volumes (MOD) LV vol d, MOD A4C: 55.4 ml LV vol s, MOD A4C: 27.5 ml LV SV MOD A4C:     55.4 ml RIGHT VENTRICLE             IVC RV S prime:     15.20 cm/s  IVC diam: 1.25 cm TAPSE (M-mode): 1.8 cm LEFT ATRIUM             Index LA diam:        3.03 cm 1.84 cm/m LA Vol (A2C):   21.1 ml 12.78  ml/m LA Vol (A4C):   33.5 ml 20.30 ml/m LA Biplane Vol: 29.1 ml 17.63 ml/m  AORTIC VALVE LVOT Vmax:   96.90 cm/s LVOT Vmean:  62.900 cm/s LVOT VTI:    0.165 m  AORTA Ao Root diam: 3.01 cm MITRAL VALVE               TRICUSPID VALVE MV Area (PHT): 3.48 cm    TR Peak grad:   53.3 mmHg MV Decel Time: 218 msec    TR Vmax:        365.00 cm/s MV E velocity: 47.20 cm/s MV A velocity: 79.60 cm/s  SHUNTS MV E/A ratio:  0.59        Systemic VTI:  0.16 m                            Systemic Diam: 1.92 cm Dalton McleanMD Electronically signed by Ezra Kanner Signature Date/Time: 07/30/2024/5:06:14 PM    Final     Time coordinating discharge: 60 mins  SIGNED:  Camellia Door, DO [facility] 08/07/24, 8:49 AM

## 2024-08-08 LAB — CULTURE, BLOOD (ROUTINE X 2)
Culture: NO GROWTH
Special Requests: ADEQUATE

## 2024-08-11 ENCOUNTER — Encounter: Payer: Self-pay | Admitting: Gastroenterology

## 2024-08-12 ENCOUNTER — Ambulatory Visit (INDEPENDENT_AMBULATORY_CARE_PROVIDER_SITE_OTHER)

## 2024-08-12 ENCOUNTER — Encounter: Payer: Self-pay | Admitting: Nurse Practitioner

## 2024-08-12 ENCOUNTER — Ambulatory Visit: Admitting: Nurse Practitioner

## 2024-08-12 VITALS — BP 120/60 | HR 47 | Temp 97.5°F | Ht 65.0 in | Wt 121.8 lb

## 2024-08-12 DIAGNOSIS — R0609 Other forms of dyspnea: Secondary | ICD-10-CM | POA: Diagnosis not present

## 2024-08-12 DIAGNOSIS — I272 Pulmonary hypertension, unspecified: Secondary | ICD-10-CM

## 2024-08-12 DIAGNOSIS — R918 Other nonspecific abnormal finding of lung field: Secondary | ICD-10-CM

## 2024-08-12 DIAGNOSIS — E8770 Fluid overload, unspecified: Secondary | ICD-10-CM

## 2024-08-12 LAB — BASIC METABOLIC PANEL WITH GFR
BUN: 19 mg/dL (ref 6–23)
CO2: 33 meq/L — ABNORMAL HIGH (ref 19–32)
Calcium: 9.3 mg/dL (ref 8.4–10.5)
Chloride: 85 meq/L — ABNORMAL LOW (ref 96–112)
Creatinine, Ser: 0.9 mg/dL (ref 0.40–1.20)
GFR: 60.31 mL/min (ref >60.00)
Glucose, Bld: 131 mg/dL — ABNORMAL HIGH (ref 70–99)
Potassium: 3.5 meq/L (ref 3.5–5.1)
Sodium: 126 meq/L — ABNORMAL LOW (ref 135–145)

## 2024-08-12 LAB — CYTOLOGY - NON PAP

## 2024-08-12 LAB — BRAIN NATRIURETIC PEPTIDE: Pro B Natriuretic peptide (BNP): 62 pg/mL (ref 0.0–100.0)

## 2024-08-12 NOTE — Patient Instructions (Addendum)
 Continue Albuterol  inhaler 2 puffs or 3 mL neb every 6 hours as needed for shortness of breath or wheezing. Notify if symptoms persist despite rescue inhaler/neb use.  Continue Breztri  2 puffs Twice daily. Brush tongue and rinse mouth afterwards  Monitor your oxygen levels at home. If you cannot stay 90% or above, you need to go back to the hospital   Chest x ray and labs today  I want you to take an extra dose of your lasix  around 3 pm for the next 3-5 days. This should make you have to use the bathroom more  Get a pair of compression stockings to help with the leg swelling Monitor your weights at home. Notify of increased weight of 2-3 lb overnight or 5-7 lb in a week  Follow up with GI as soon as possible. Talk to your PCP about the swelling in your abdomen tomorrow. You may need a repeat tap to pull the fluid off   We will need to repeat your CT chest in 4-6 weeks to make sure the masses are resolving. If not, we will need to have you undergo a biopsy   Repeat echocardiogram in 3 months    Follow up in 2-3 weeks with Dr. Meade (1st) or Katie Man Bonneau,NP. If symptoms do not improve or worsen, please contact office for sooner follow up or seek emergency care.

## 2024-08-12 NOTE — Progress Notes (Signed)
 @Patient  ID: Kara Newman, female    DOB: 19-Mar-1946, 78 y.o.   MRN: [id number]  Chief Complaint  Patient presents with   COPD    SOB with exertion and dry cough at time prod with clear to yellow sputum.     Referring provider: Elliot Charm  HPI: 78 year old female, former smoker followed for COPD. She is a patient of Dr. Correne and last seen in office 07/24/2024 by Kara Newman. Past medical history significant for HTN, allergies, hypothyroid, HLD, anxiety.   TEST/EVENTS:  05/13/2020 PFT: FVC 65, FEV1 47, ratio 58, TLC 114, DLCO 69. Positive bronchodilator response. Severe obstruction with moderate diffusion defect and air trapping 07/30/2024 echo: EF 60-65%. RV size mildly enlarged. GIDD. Moderately elevated PASP. Mild to moderate TR. Trivial AR.  08/02/2024 CTA chest: chronic appearing PE LLL PE. Mild atherosclerosis. Small mediastinal nodes, not pathological in size. Small b/l pleural effusions R>L. Multifocal patchy/nodular opacities in lungs b/l, 2.4x2 cm LUL and 4.3x3.2 cm in RUL. Concern for multifocal tumor vs multifocal pna. Interstitial edema.  08/02/2024 CT abd/pelvis: large volume ascites, cirrhotic appearing liver, nodular appearance of omentum in anterior abd. Colonic diverticulosis   04/24/2024: OV with Dr. Meade. Annual COPD f/u. Lives in [place] year round and comes back to [facility]. Feels breathing is getting worse. Has had more falls. Using breztri  more than prescribed - encouraged to only use twice daily. Refilled albuterol . Never got pulmonary rehab- referral placed.  06/05/2024: SHERLEAN with Tylerjames Hoglund Newman Discussed the use of AI scribe software for clinical note transcription with the patient, who gave verbal consent to proceed. [name] is an 78 year old female with chronic respiratory issues who presents with a persistent cough and breathing difficulties.  She has experienced a persistent cough for four weeks, which began after her last visit with Dr. Meade. The cough is  productive, especially in the mornings, with yellow sputum. No recent exposure to sick contacts as she lives alone. No recent fevers or weight loss. She experiences wheezing but no hemoptysis. Her voice hoarseness is not new. She reports that her breathing is about the same as at her last visit, although she occasionally needs to use her Breztri  inhaler more than the prescribed two puffs twice a day. She does not have a nebulizer machine at home. She uses Zyrtec for allergies but does not use nasal sprays. She recently started using Mucinex DM for her cough, which has not been effective. She reports a lot of sinus drainage, which started around the same time. No facial pain,sore throat.   07/24/2024: Ov with Kara Newman is an 78 year old female with COPD who presents with significant swelling and breathing difficulties. She has been experiencing significant swelling over the past four weeks, initially in her ankles and now progressing to her abdomen. Her history of COPD has been managed with treatments for flare-ups in the past. She experiences coughing fits, especially when lying down. She has been treated with medications for coughing, which initially helped, but the coughing persists. No increase in oxygen use, hemoptysis, or fevers.  She has not been on any diuretics yet. Her sodium levels were slightly low during her last lab work; BNP is normal. She has not experienced any significant weight gain, but her appetite is poor, and she is eating very little. She is experiencing severe constipation, having not had a normal bowel movement for weeks despite trying Miralax and stool softeners. Her abdomen feels very bloated. No  nausea, vomiting, abdominal pain, bloody stools.  Sinuses are better. No issues with congestion or drainage. No increased oxygen requirements.   08/12/2024: Today - follow up  Discussed the use of AI scribe software for clinical note transcription with the patient, who  gave verbal consent to proceed.  History of Present Illness  Kara Newman is an 78 year old female who presents with her son for follow up.   After our last visit, she was doing okay but then symptoms progressed. She went to the hospital on 9/20 with increased dyspnea and acute on chronic respiratory failure requiring 3 lpm supplemental O2. She was diagnosed with pneumonia and started on IV abx. Her CTA chest showed a PE in LLL as well as two lung masses, infectious vs neoplastic in nature. She was discharged on additional 5 days of duricef, which she will complete tomorrow. She also had a paracentesis, with cytology negative for malignancy. Referral was made to GI outpatient for cirrhosis/ascites workup, whom she sees later in October. She also was diagnosed with a PE, acute vs chronic, and started on Eliquis  with duration of 6 months of treatment planned.   She is feeling better since her admission but still having shortness of breath with exertion. Feels similar to how she felt at discharge. Not having to use her oxygen routinely and able to maintain oxygen levels >88-90% on room air. She is still using it some with exertion and when sleeping.  She has noticed that her abdomen and legs are swelling more over the last 1-2 days. Initially, the swelling decreased but has since increased again, causing her abdomen to feel tight and contributing to respiratory discomfort. She's not wearing any compression stockings. Hasn't been monitoring her weights at home.   Still has a congested cough without any mucus production. No wheezing, fevers, chills, hemoptysis, CP.   She is currently taking Lasix  40 mg once daily and spironolactone . During her hospital stay, fluid was noted on both sides of her lungs. Last chest x ray without significant effusions.      Allergies  Allergen Reactions   Ace Inhibitors     Other reaction(s): Cough (ALLERGY/intolerance)   Benzalkonium Chloride Other (See Comments)     Other reaction(s): Other (See Comments), Other (See Comments) Caused an infection Caused an infection    Levothyroxine  Sodium Other (See Comments)    Other reaction(s): troubles in the past, wants brand Synthroid    Latex Rash   Sulfa Antibiotics Other (See Comments)    Patient cannot recall if she has an allergy.  She does not take this medication due to it being ineffective.     Immunization History  Administered Date(s) Administered   Fluad Quad(high Dose 65+) 09/19/2020, 09/02/2021   Fluad Trivalent(High Dose 65+) 09/25/2016   Fluzone Influenza virus vaccine,trivalent (IIV3), split virus 09/14/2011   INFLUENZA, HIGH DOSE SEASONAL PF 07/26/2018   Influenza Split 08/13/2012   Influenza, Seasonal, Injecte, Preservative Fre 09/10/2014, 08/31/2015   Influenza,inj,Quad PF,6+ Mos 09/03/2013   Moderna Sars-Covid-2 Vaccination 12/01/2019, 12/29/2019   Pneumococcal Conjugate-13 10/01/2014, 07/05/2018   Pneumococcal Polysaccharide-23 07/22/2009   Pneumococcal-Unspecified 06/13/2010   Td 08/18/2018    Past Medical History:  Diagnosis Date   Breast cancer (HCC)    had left mastectomy.    COPD (chronic obstructive pulmonary disease) (HCC)    Endometriosis    History of colonic polyps 03/23/2021   IMO SNOMED Dx Update Oct 2024      Tobacco History: Social History   Tobacco  Use  Smoking Status Former   Current packs/day: 0.00   Average packs/day: 1 pack/day for 38.0 years (38.0 ttl pk-yrs)   Types: Cigarettes   Start date: 11/06/1962   Quit date: 11/06/2000   Years since quitting: 23.7  Smokeless Tobacco Never   Counseling given: Not Answered   Outpatient Medications Prior to Visit  Medication Sig Dispense Refill   acetaminophen  (TYLENOL ) 500 MG tablet Take 500 mg by mouth every 6 (six) hours as needed for mild pain (pain score 1-3) or headache.     albuterol  (VENTOLIN  HFA) 108 (90 Base) MCG/ACT inhaler Inhale 2 puffs into the lungs every 4 (four) hours as needed for  wheezing or shortness of breath. 72 each 3   APIXABAN  (ELIQUIS ) VTE STARTER PACK (10MG  AND 5MG ) Take as directed on package: start with two-5mg  tablets twice daily for 7 days. On day 8, switch to one-5mg  tablet twice daily. 74 each 0   budesonide -glycopyrrolate -formoterol  (BREZTRI  AEROSPHERE) 160-9-4.8 MCG/ACT AERO inhaler Inhale 2 puffs into the lungs in the morning and at bedtime. 3 each 3   Cholecalciferol 50 MCG (2000 UT) CAPS Take 2,000 Units by mouth in the morning.     furosemide  (LASIX ) 40 MG tablet Take 1 tablet (40 mg total) by mouth daily. 30 tablet 5   ipratropium-albuterol  (DUONEB) 0.5-2.5 (3) MG/3ML SOLN Take 3 mLs by nebulization every 6 (six) hours as needed. 360 mL 0   OXYGEN Inhale 3 L into the lungs at bedtime.     promethazine -dextromethorphan (PROMETHAZINE -DM) 6.25-15 MG/5ML syrup Take 5 mLs by mouth 4 (four) times daily as needed for cough. 118 mL 0   spironolactone  (ALDACTONE ) 50 MG tablet Take 1 tablet (50 mg total) by mouth in the morning. 30 tablet 0   SYNTHROID  100 MCG tablet Take 100 mcg by mouth every morning.     No facility-administered medications prior to visit.     Review of Systems: As above; otherwise, negative    Physical Exam:  BP 120/60   Pulse (!) 47   Temp (!) 97.5 F (36.4 C) (Oral)   Ht 5' 5 (1.651 m)   Wt 128 lb (58.1 kg)   SpO2 95%   BMI 21.30 kg/m   GEN: Pleasant, interactive, well-appearing; in no acute distress HEENT:  Normocephalic and atraumatic. PERRLA. Sclera white. Nasal turbinates pink, moist and patent bilaterally. No rhinorrhea present. Oropharynx pink and moist, without exudate or edema. No lesions, ulcerations, or postnasal drip.  NECK:  Supple w/ fair ROM. No JVD present. No lymphadenopathy.   CV: RRR, no m/r/g, bilateral LE +2-3 peripheral edema. Pulses intact, +2 bilaterally. No cyanosis, pallor or clubbing. PULMONARY:  Unlabored, regular breathing. Diminished bilaterally A&P with crackles present in lung bases. W/o  wheezes/rales/rhonchi. No accessory muscle use.  GI: BS present and normoactive. Distended, non-tender to palpation. No organomegaly or masses detected. MSK: No erythema, warmth or tenderness. Cap refil <2 sec all extrem.  Neuro: A/Ox3. No focal deficits noted.   Skin: Warm, no lesions or rashe Psych: Normal affect and behavior. Judgement and thought content appropriate.     Lab Results:  CBC    Component Value Date/Time   WBC 15.3 (H) 08/06/2024 0509   RBC 4.09 08/06/2024 0509   HGB 13.0 08/06/2024 0509   HGB 12.4 08/02/2023 1331   HCT 37.7 08/06/2024 0509   PLT 648 (H) 08/06/2024 0509   PLT 583 (H) 08/02/2023 1331   MCV 92.2 08/06/2024 0509   MCH 31.8 08/06/2024 0509   MCHC  34.5 08/06/2024 0509   RDW 12.8 08/06/2024 0509   LYMPHSABS 1.2 08/05/2024 0505   MONOABS 1.2 (H) 08/05/2024 0505   EOSABS 0.1 08/05/2024 0505   BASOSABS 0.1 08/05/2024 0505    BMET    Component Value Date/Time   NA 133 (L) 08/05/2024 0505   K 3.9 08/05/2024 0505   CL 95 (L) 08/05/2024 0505   CO2 27 08/05/2024 0505   GLUCOSE 96 08/05/2024 0505   BUN 10 08/05/2024 0505   CREATININE 0.77 08/05/2024 0505   CREATININE 0.96 06/30/2023 1031   CALCIUM 8.1 (L) 08/05/2024 0505   GFRNONAA >60 08/05/2024 0505   GFRNONAA >60 06/30/2023 1031    BNP    Component Value Date/Time   BNP 65.7 08/05/2024 0505     Imaging:  IR ABDOMEN US  LIMITED Result Date: 08/06/2024 INDICATION: 78 year old female. History of breast cancer. Found to have ascites. Request is for a diagnostic paracentesis. EXAM: ULTRASOUND ABDOMEN LIMITED FINDINGS: Imaging of all 4 quadrants of the abdomen reveal small amount ascites in the right upper quadrant. Trace amounts in the other 3 quadrants IMPRESSION: Small amount of ascites seen on ultrasound. After discussion of the risks versus benefits of the procedure the patient decided to defer paracentesis at this time. Read by: Delon Beagle, Newman Electronically Signed   By: Ester Sides M.D.   On: 08/06/2024 14:31   DG CHEST PORT 1 VIEW Result Date: 08/05/2024 CLINICAL DATA:  Shortness of breath EXAM: PORTABLE CHEST 1 VIEW COMPARISON:  08/02/2024 FINDINGS: Heart and mediastinal contours are within normal limits. Bilateral lower lobe airspace disease again noted, similar to prior study. No visible effusions or pneumothorax. No acute bony abnormality. IMPRESSION: Continued bilateral lower lobe airspace opacities, similar to prior study. Electronically Signed   By: Franky Crease M.D.   On: 08/05/2024 20:40   VAS US  LOWER EXTREMITY VENOUS (DVT) Result Date: 08/04/2024  Lower Venous DVT Study Patient Name:  KAITLAN BIN  Date of Exam:   08/04/2024 Medical Rec #: [id number]        Accession #:    [id number] Date of Birth: 01-28-1946        Patient Gender: F Patient Age:   1 years Exam Location:  [facility] Procedure:      VAS US  LOWER EXTREMITY VENOUS (DVT) Referring Phys: A POWELL JR --------------------------------------------------------------------------------  Indications: Pulmonary embolism.  Limitations: Poor ultrasound/tissue interface. Comparison Study: No previous exams Performing Technologist: Jody Hill RVT, RDMS  Examination Guidelines: A complete evaluation includes B-mode imaging, spectral Doppler, color Doppler, and power Doppler as needed of all accessible portions of each vessel. Bilateral testing is considered an integral part of a complete examination. Limited examinations for reoccurring indications may be performed as noted. The reflux portion of the exam is performed with the patient in reverse Trendelenburg.  +---------+---------------+---------+-----------+----------+--------------+ RIGHT    CompressibilityPhasicitySpontaneityPropertiesThrombus Aging +---------+---------------+---------+-----------+----------+--------------+ CFV      Full           Yes      Yes                                  +---------+---------------+---------+-----------+----------+--------------+ SFJ      Full                                                        +---------+---------------+---------+-----------+----------+--------------+  FV Prox  Full           Yes      Yes                                 +---------+---------------+---------+-----------+----------+--------------+ FV Mid   Full           Yes      Yes                                 +---------+---------------+---------+-----------+----------+--------------+ FV DistalFull           Yes      Yes                                 +---------+---------------+---------+-----------+----------+--------------+ PFV      Full                                                        +---------+---------------+---------+-----------+----------+--------------+ POP      Full           Yes      Yes                                 +---------+---------------+---------+-----------+----------+--------------+ PTV      Full                                                        +---------+---------------+---------+-----------+----------+--------------+ PERO     Full                                                        +---------+---------------+---------+-----------+----------+--------------+ Gastroc  None           No       No                   Acute          +---------+---------------+---------+-----------+----------+--------------+ IM calf  None           No       No                   Acute          +---------+---------------+---------+-----------+----------+--------------+   +---------+---------------+---------+-----------+----------+--------------+ LEFT     CompressibilityPhasicitySpontaneityPropertiesThrombus Aging +---------+---------------+---------+-----------+----------+--------------+ CFV      Full           Yes      Yes                                  +---------+---------------+---------+-----------+----------+--------------+ SFJ      Full                                                        +---------+---------------+---------+-----------+----------+--------------+  FV Prox  Full           Yes      Yes                                 +---------+---------------+---------+-----------+----------+--------------+ FV Mid   Full           Yes      Yes                                 +---------+---------------+---------+-----------+----------+--------------+ FV DistalFull           Yes      Yes                                 +---------+---------------+---------+-----------+----------+--------------+ PFV      Full                                                        +---------+---------------+---------+-----------+----------+--------------+ POP      Full           Yes      Yes                                 +---------+---------------+---------+-----------+----------+--------------+ Gastroc  None           No       No                   Acute          +---------+---------------+---------+-----------+----------+--------------+     Summary: BILATERAL: - No evidence of deep vein thrombosis seen in the lower extremities, bilaterally. -No evidence of popliteal cyst, bilaterally. RIGHT: Findings consistent with acute intramuscular thrombosis involving the right gastrocnemius veins, and right intramuscular calf vein.  LEFT: Findings consistent with acute intramuscular thrombosis involving the left gastrocnemius veins.  *See table(s) above for measurements and observations. Electronically signed by Debby Robertson on 08/04/2024 at 4:48:28 PM.    Final    US  Paracentesis Result Date: 08/03/2024 INDICATION: Patient with ascites of uncertain etiology presents today for a diagnostic and therapeutic paracentesis. EXAM: ULTRASOUND GUIDED PARACENTESIS MEDICATIONS: 1% lidocaine  10 mL COMPLICATIONS: None immediate. PROCEDURE: Informed  written consent was obtained from the patient after a discussion of the risks, benefits and alternatives to treatment. A timeout was performed prior to the initiation of the procedure. Initial ultrasound scanning demonstrates a large amount of ascites within the right lower abdominal quadrant. The right lower abdomen was prepped and draped in the usual sterile fashion. 1% lidocaine  was used for local anesthesia. Following this, a 19 gauge, 7-cm, Yueh catheter was introduced. An ultrasound image was saved for documentation purposes. The paracentesis was performed. The catheter was removed and a dressing was applied. The patient tolerated the procedure well without immediate post procedural complication. FINDINGS: A total of approximately 3 L of clear yellow fluid was removed. Samples were sent to the laboratory as requested by the clinical team. IMPRESSION: Successful ultrasound-guided paracentesis yielding 3 liters of peritoneal fluid. Procedure performed by Warren Dais, Newman Electronically Signed   By: Cordella  Jenna   On: 08/03/2024 10:55   CT Angio Chest PE W and/or Wo Contrast Addendum Date: 08/02/2024 ADDENDUM #1 ADDENDUM: Critical Value/emergent results were called by telephone at the time of interpretation on 08/02/2024 at 2036 hrs to provider Dr Ginger. ---------------------------------------------------- Electronically signed by: Pinkie Pebbles MD 08/02/2024 08:49 PM EDT RP Workstation: [id number]   Result Date: 08/02/2024 ORIGINAL REPORT EXAM: CTA of the Chest with contrast for PE 08/02/2024 08:10:00 PM TECHNIQUE: CTA of the chest was performed after the administration of intravenous contrast. Multiplanar reformatted images are provided for review. MIP images are provided for review. Automated exposure control, iterative reconstruction, and/or weight based adjustment of the mA/kV was utilized to reduce the radiation dose to as low as reasonably achievable. COMPARISON: Chest radiograph earlier  today. CLINICAL HISTORY: Pulmonary embolism (PE) suspected, high prob. Chief complaints; Shortness of Breath; CT Angio Chest PE W and/or Wo Contrast; Pulmonary embolism (PE) suspected, high prob. FINDINGS: PULMONARY ARTERIES: Peripheral linear filling defect at the bifurcation of the distal left main pulmonary artery (image 61) extending into the segmental lingular pulmonary artery (image 56) as well as the anterior segment left lower lobe (image 85) suggesting mild segmental pulmonary embolism, although possibly chronic given the appearance. MEDIASTINUM: No evidence of right heart strain. Thoracic aortic atherosclerosis. Mild coronary atherosclerosis of the LAD. LYMPH NODES: Small mediastinal lymph nodes which do not need pathologic CT size criteria. LUNGS AND PLEURA: Small bilateral pleural effusions, right greater than left. Multifocal patchy/nodular opacities in the lungs bilaterally, including a 2.4 x 2.0 cm nodular opacity in the anteromedial left upper lobe (image 42) and a 4.3 x 3.2 cm mass like opacity in the inferomedial right upper lobe (image 89). This appearance raises concern for multifocal tumor, less likely multifocal infection/pneumonia. Very mild interlobular septal thickening in the upper lobes but this appearance is not favored to reflect interstitial edema. UPPER ABDOMEN: Upper abdomen is better evaluated on recent CT abdomen/pelvis. 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 Mild degenerative changes of the mid thoracic spine. IMPRESSION: 1. 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 No evidence of right heart strain. 2. Multifocal patchy opacities in the lungs bilaterally, some of which may reflect infection/pneumonia, although the appearance is concerning for underlying multifocal tumor. Follow-up CT chest is suggested in 3-4 weeks after appropriate antimicrobial therapy. 3. Small bilateral pleural effusions,  right greater than left. Electronically signed by: Pinkie Pebbles MD 08/02/2024 08:40 PM EDT RP Workstation: [id number]   CT ABDOMEN PELVIS W CONTRAST Result Date: 08/02/2024 CLINICAL DATA:  Bowel obstruction suspected. Distended abdomen. Lower extremity swelling. EXAM: CT ABDOMEN AND PELVIS WITH CONTRAST TECHNIQUE: Multidetector CT imaging of the abdomen and pelvis was performed using the standard protocol following bolus administration of intravenous contrast. RADIATION DOSE REDUCTION: This exam was performed according to the departmental dose-optimization program which includes automated exposure control, adjustment of the mA and/or kV according to patient size and/or use of iterative reconstruction technique. CONTRAST:  75mL OMNIPAQUE  IOHEXOL  350 MG/ML SOLN COMPARISON:  None Available. FINDINGS: Lower chest: Extensive nodular appearing airspace disease peripherally in both lower lungs. Small bilateral pleural effusions. Heart is normal size. Hepatobiliary: Liver appears shrunken with suggestion of nodular surface suggesting cirrhosis. Recommend clinical correlation. Gallbladder unremarkable. No biliary ductal dilatation. Pancreas: Extensive cystic changes throughout the pancreatic body and tail. No surrounding inflammation or ductal dilatation. Spleen: No focal abnormality.  Normal size. Adrenals/Urinary Tract: No suspicious renal or adrenal abnormality. Small  scattered bilateral renal cysts. No stones or hydronephrosis. Urinary bladder unremarkable. Stomach/Bowel: Left colonic diverticulosis. No active diverticulitis. Stomach and small bowel decompressed. Vascular/Lymphatic: Aortic atherosclerosis. No evidence of aneurysm or adenopathy. Reproductive: Uterus appears very small.  No adnexal mass. Other: Large volume ascites in the abdomen and pelvis. There appears to be abnormal infiltrated/nodular appearance of the omentum anteriorly. Musculoskeletal: No acute bony abnormality. IMPRESSION: Extensive  peripheral nodular airspace disease throughout both lower lungs with small bilateral pleural effusions. This could reflect infectious or neoplastic process. Insert further evaluation with full dedicated chest CT with IV contrast. 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 Large volume ascites. Abnormal infiltrated/nodular appearance of the omentum in the anterior abdomen. Cannot exclude peritoneal malignancy/carcinomatosis. Left colonic diverticulosis.  No active diverticulitis. Aortic atherosclerosis. Electronically Signed   By: Franky Crease M.D.   On: 08/02/2024 18:15   DG Chest Portable 1 View Result Date: 08/02/2024 CLINICAL DATA:  SOB, rales, edema. EXAM: PORTABLE CHEST 1 VIEW COMPARISON:  06/05/2024. FINDINGS: Bilateral lungs appear hyperlucent with coarse bronchovascular markings, in keeping with COPD. There are diffuse increased interstitial markings overlying the bilateral lower lung zones with associated new bilateral small pleural effusions, favoring congestive heart failure/pulmonary edema. There are retrocardiac opacities partially obscuring the left medial hemidiaphragm, which may represent left lung atelectasis and/or consolidation. Correlate clinically. Stable cardio-mediastinal silhouette. No acute osseous abnormalities. The soft tissues are within normal limits. IMPRESSION: 1. Findings favor congestive heart failure/pulmonary edema. 2. There are retrocardiac opacities partially obscuring the left medial hemidiaphragm, which may represent left lung atelectasis and/or consolidation. Electronically Signed   By: Ree Molt M.D.   On: 08/02/2024 16:44   ECHOCARDIOGRAM COMPLETE Result Date: 07/30/2024    ECHOCARDIOGRAM REPORT   Patient Name:   CATHARINE KETTLEWELL Date of Exam: 07/30/2024 Medical Rec #:  [id number]       Height:       65.0 in Accession #:    [id number]      Weight:       130.6 lb Date of Birth:  January 17, 1946       BSA:          1.650 m Patient Age:    78 years        BP:            115/69 mmHg Patient Gender: F               HR:           85 bpm. Exam Location:  Outpatient Procedure: 2D Echo, Color Doppler and Cardiac Doppler (Both Spectral and Color            Flow Doppler were utilized during procedure). Indications:    Dyspnea R06.00  History:        Patient has no prior history of Echocardiogram examinations.                 COPD, Signs/Symptoms:Shortness of Breath and Dyspnea; Risk                 Factors:Hypertension and Former Smoker.  Sonographer:    Koleen Popper RDCS Referring Phys: [id number] COMER LULLA ROULEAU  Sonographer Comments: Image acquisition challenging due to COPD, Image acquisition challenging due to respiratory motion and Image acquisition challenging due to breast implants. IMPRESSIONS  1. Left ventricular ejection fraction, by estimation, is 60 to 65%. The left ventricle has normal function. The left ventricle has no regional wall motion abnormalities. Left ventricular diastolic parameters are consistent  with Grade I diastolic dysfunction (impaired relaxation).  2. Right ventricular systolic function is normal. The right ventricular size is mildly enlarged. There is moderately elevated pulmonary artery systolic pressure. The estimated right ventricular systolic pressure is 56.3 mmHg.  3. The mitral valve is normal in structure. No evidence of mitral valve regurgitation. No evidence of mitral stenosis.  4. Tricuspid valve regurgitation is mild to moderate.  5. The aortic valve was not well visualized. There is moderate calcification of the aortic valve. Aortic valve regurgitation is trivial.  6. The inferior vena cava is normal in size with greater than 50% respiratory variability, suggesting right atrial pressure of 3 mmHg.  7. There appears to be ascites present around liver. FINDINGS  Left Ventricle: Left ventricular ejection fraction, by estimation, is 60 to 65%. The left ventricle has normal function. The left ventricle has no regional wall motion  abnormalities. The left ventricular internal cavity size was normal in size. There is  no left ventricular hypertrophy. Left ventricular diastolic parameters are consistent with Grade I diastolic dysfunction (impaired relaxation). Right Ventricle: The right ventricular size is mildly enlarged. No increase in right ventricular wall thickness. Right ventricular systolic function is normal. There is moderately elevated pulmonary artery systolic pressure. The tricuspid regurgitant velocity is 3.65 m/s, and with an assumed right atrial pressure of 3 mmHg, the estimated right ventricular systolic pressure is 56.3 mmHg. Left Atrium: Left atrial size was normal in size. Right Atrium: Right atrial size was normal in size. Pericardium: Trivial pericardial effusion is present. Mitral Valve: The mitral valve is normal in structure. There is mild calcification of the mitral valve leaflet(s). Mild mitral annular calcification. No evidence of mitral valve regurgitation. No evidence of mitral valve stenosis. Tricuspid Valve: The tricuspid valve is normal in structure. Tricuspid valve regurgitation is mild to moderate. Aortic Valve: The aortic valve was not well visualized. There is moderate calcification of the aortic valve. Aortic valve regurgitation is trivial. Pulmonic Valve: The pulmonic valve was normal in structure. Pulmonic valve regurgitation is not visualized. Aorta: The aortic root is normal in size and structure. Venous: The inferior vena cava is normal in size with greater than 50% respiratory variability, suggesting right atrial pressure of 3 mmHg. IAS/Shunts: No atrial level shunt detected by color flow Doppler.  LEFT VENTRICLE PLAX 2D LVIDd:         3.82 cm     Diastology LVIDs:         2.55 cm     LV e' medial:    6.64 cm/s LV PW:         0.72 cm     LV E/e' medial:  7.1 LV IVS:        0.83 cm     LV e' lateral:   8.70 cm/s LVOT diam:     1.92 cm     LV E/e' lateral: 5.4 LV SV:         48 LV SV Index:   29 LVOT  Area:     2.90 cm  LV Volumes (MOD) LV vol d, MOD A4C: 55.4 ml LV vol s, MOD A4C: 27.5 ml LV SV MOD A4C:     55.4 ml RIGHT VENTRICLE             IVC RV S prime:     15.20 cm/s  IVC diam: 1.25 cm TAPSE (M-mode): 1.8 cm LEFT ATRIUM             Index LA diam:  3.03 cm 1.84 cm/m LA Vol (A2C):   21.1 ml 12.78 ml/m LA Vol (A4C):   33.5 ml 20.30 ml/m LA Biplane Vol: 29.1 ml 17.63 ml/m  AORTIC VALVE LVOT Vmax:   96.90 cm/s LVOT Vmean:  62.900 cm/s LVOT VTI:    0.165 m  AORTA Ao Root diam: 3.01 cm MITRAL VALVE               TRICUSPID VALVE MV Area (PHT): 3.48 cm    TR Peak grad:   53.3 mmHg MV Decel Time: 218 msec    TR Vmax:        365.00 cm/s MV E velocity: 47.20 cm/s MV A velocity: 79.60 cm/s  SHUNTS MV E/A ratio:  0.59        Systemic VTI:  0.16 m                            Systemic Diam: 1.92 cm Dalton McleanMD Electronically signed by Ezra Kanner Signature Date/Time: 07/30/2024/5:06:14 PM    Final      Administration History     None          Latest Ref Rng & Units 05/13/2020    2:51 PM  PFT Results  FVC-Pre L 1.98   FVC-Predicted Pre % 65   FVC-Post L 2.14   FVC-Predicted Post % 70   Pre FEV1/FVC % % 54   Post FEV1/FCV % % 58   FEV1-Pre L 1.07   FEV1-Predicted Pre % 47   FEV1-Post L 1.24   DLCO uncorrected ml/min/mmHg 14.06   DLCO UNC% % 69   DLCO corrected ml/min/mmHg 14.06   DLCO COR %Predicted % 69   DLVA Predicted % 82   TLC L 6.15   TLC % Predicted % 114   RV % Predicted % 160     No results found for: NITRICOXIDE      Assessment & Plan:   Assessment & Plan 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 No worsening respiratory symptoms. Will increase lasix  for the next 3-5 days. Repeat CXR to ensure b/l effusions have not increased. Follow up scheduled with PCP tomorrow. May need repeat paracentesis. Attend appt with GI as scheduled.  - Follow up with PCP  - Consider repeat paracentesis to drain  abdominal fluid - Continue Lasix  and spironolactone  - Increase Lasix  dosage for next 3-5 days - Recheck labs to assess kidney function before increasing Lasix  dosage - Advise use of compression stockings to manage edema  Pulmonary embolism with pulmonary hypertension Pulmonary embolism noted on CT scan, appeared to be chronic but unable to rule out acute. Currently managed with Eliquis . Possible right heart strain associated based on echo with RV enlargement and moderately elevated PASP. Will plan to repeat in 3 months after on [facility]. Monitor weights/swelling.  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 Neoplastic vs infectious. Completing abx course tomorrow, 10/1 for 10 days total. Clinically improved when compared to admission. Will plan to repeat CT chest in 4-6 weeks to ensure resolution. If failure to improve/growth occurs, will need bronchoscopy with tissue sampling for further evaluation. No infectious symptoms today  - Schedule repeat CT scan 4-6 weeks after completion of antibiotics - Consider biopsy if lung masses do not resolve or increase in size  Pneumonia (  resolving) No current signs of infection such as fever or hemoptysis. No need for extended antibiotics or steroids. - Do not extend antibiotics - Monitor oxygen levels at home - Advise return to hospital if oxygen levels drop below 88% or if swelling worsens  COPD See above. Continue maintenance regimen. No evidence of exacerbation today. Action plan in place - Continue albuterol  PRN - Continue Breztri  bid   Chronic respiratory failure No increased oxygen requirements. Goal >88-90%.  - Continue supplemental oxygen     Advised if symptoms do not improve or worsen, to please contact office for sooner follow up or seek emergency care.   I spent 45 minutes of dedicated to the care of this patient on  the date of this encounter to include pre-visit review of records, face-to-face time with the patient discussing conditions above, post visit ordering of testing, clinical documentation with the electronic health record, making appropriate referrals as documented, and communicating necessary findings to members of the patients care team.  Comer LULLA Rouleau, Newman 08/12/2024  Pt aware and understands Newman's role.

## 2024-08-13 ENCOUNTER — Ambulatory Visit: Payer: Self-pay | Admitting: Nurse Practitioner

## 2024-08-13 DIAGNOSIS — E8770 Fluid overload, unspecified: Secondary | ICD-10-CM

## 2024-08-13 DIAGNOSIS — R0609 Other forms of dyspnea: Secondary | ICD-10-CM

## 2024-08-13 MED ORDER — POTASSIUM CHLORIDE CRYS ER 10 MEQ PO TBCR: 10.0000 meq | EXTENDED_RELEASE_TABLET | Freq: Every day | ORAL | 0 refills | Status: DC

## 2024-08-13 MED ORDER — FUROSEMIDE 40 MG PO TABS: 40.0000 mg | ORAL_TABLET | Freq: Every day | ORAL | 0 refills | Status: DC

## 2024-08-20 ENCOUNTER — Telehealth: Payer: Self-pay

## 2024-08-20 NOTE — Telephone Encounter (Signed)
 Spoke with patient and received permission to discuss her health with her niece, Kara Newman. Scheduler had sent a message this morning about scheduling an appointment for the patient and mentioned she was sick. I called to follow up and clarify the patient's condition.  Kara Newman informed me that the patient was recently hospitalized for shortness of breath (SOB) and fluid in the abdomin. She is currently on continued oxygen daily. The patient is experiencing low energy and swelling in both feet.  I advised Kara Newman to monitor the patient's breathing and oxygen status daily, encourage fluid intake, and promote a diet rich in protein, fruits, vegetables, and whole grains to help boost energy levels. Kara Newman also mentioned that Kara Newman (PCP) discontinued the patient's Lasix  as of last Thursday, possibly due to dehydration.  I reached out to Kara Rouleau, NP, at the pulmonary office to inform her that the patient is not currently taking Lasix . Per Cobb, NP, the patient should be on both Lasix  and spironolactone . I shared Kara Newman's recommendation with Kara Newman and advised her to consult with the patient's PCP regarding Lasix , especially since the patient's feet are swollen.  Kara Newman stated that Kara Newman (patient's son) would reach out to the PCP either through MyChart or by phone to discuss any concerns.  I also informed Kara Newman about the patient's upcoming appointment at our office on 10/14 for labs at 2:30 PM and a visit with Kara Newman at 3:15 PM. Kara Newman confirmed understanding.

## 2024-08-25 ENCOUNTER — Other Ambulatory Visit: Payer: Self-pay

## 2024-08-25 ENCOUNTER — Ambulatory Visit: Admitting: Nurse Practitioner

## 2024-08-25 DIAGNOSIS — D75839 Thrombocytosis, unspecified: Secondary | ICD-10-CM

## 2024-08-26 ENCOUNTER — Inpatient Hospital Stay: Attending: Internal Medicine

## 2024-08-26 ENCOUNTER — Inpatient Hospital Stay: Admitting: Internal Medicine

## 2024-08-26 ENCOUNTER — Ambulatory Visit: Admitting: Physician Assistant

## 2024-08-26 VITALS — BP 134/79 | HR 88 | Temp 98.0°F | Resp 18

## 2024-08-26 DIAGNOSIS — Z853 Personal history of malignant neoplasm of breast: Secondary | ICD-10-CM | POA: Diagnosis not present

## 2024-08-26 DIAGNOSIS — R748 Abnormal levels of other serum enzymes: Secondary | ICD-10-CM | POA: Diagnosis not present

## 2024-08-26 DIAGNOSIS — K8689 Other specified diseases of pancreas: Secondary | ICD-10-CM

## 2024-08-26 DIAGNOSIS — K869 Disease of pancreas, unspecified: Secondary | ICD-10-CM

## 2024-08-26 DIAGNOSIS — N839 Noninflammatory disorder of ovary, fallopian tube and broad ligament, unspecified: Secondary | ICD-10-CM

## 2024-08-26 DIAGNOSIS — C786 Secondary malignant neoplasm of retroperitoneum and peritoneum: Secondary | ICD-10-CM

## 2024-08-26 DIAGNOSIS — J90 Pleural effusion, not elsewhere classified: Secondary | ICD-10-CM | POA: Diagnosis not present

## 2024-08-26 DIAGNOSIS — R5383 Other fatigue: Secondary | ICD-10-CM | POA: Diagnosis not present

## 2024-08-26 DIAGNOSIS — D75838 Other thrombocytosis: Secondary | ICD-10-CM | POA: Insufficient documentation

## 2024-08-26 DIAGNOSIS — D473 Essential (hemorrhagic) thrombocythemia: Secondary | ICD-10-CM | POA: Insufficient documentation

## 2024-08-26 DIAGNOSIS — I2699 Other pulmonary embolism without acute cor pulmonale: Secondary | ICD-10-CM | POA: Diagnosis not present

## 2024-08-26 DIAGNOSIS — I519 Heart disease, unspecified: Secondary | ICD-10-CM | POA: Diagnosis not present

## 2024-08-26 DIAGNOSIS — R634 Abnormal weight loss: Secondary | ICD-10-CM | POA: Insufficient documentation

## 2024-08-26 DIAGNOSIS — D75839 Thrombocytosis, unspecified: Secondary | ICD-10-CM

## 2024-08-26 DIAGNOSIS — R188 Other ascites: Secondary | ICD-10-CM | POA: Diagnosis not present

## 2024-08-26 DIAGNOSIS — R180 Malignant ascites: Secondary | ICD-10-CM | POA: Diagnosis not present

## 2024-08-26 DIAGNOSIS — K7469 Other cirrhosis of liver: Secondary | ICD-10-CM

## 2024-08-26 DIAGNOSIS — C762 Malignant neoplasm of abdomen: Secondary | ICD-10-CM

## 2024-08-26 LAB — CBC WITH DIFFERENTIAL (CANCER CENTER ONLY)
Abs Immature Granulocytes: 0.12 K/uL — ABNORMAL HIGH (ref 0.00–0.07)
Basophils Absolute: 0.1 K/uL (ref 0.0–0.1)
Basophils Relative: 0 %
Eosinophils Absolute: 0.1 K/uL (ref 0.0–0.5)
Eosinophils Relative: 0 %
HCT: 37.4 % (ref 36.0–46.0)
Hemoglobin: 12.6 g/dL (ref 12.0–15.0)
Immature Granulocytes: 1 %
Lymphocytes Relative: 6 %
Lymphs Abs: 1.1 K/uL (ref 0.7–4.0)
MCH: 30.9 pg (ref 26.0–34.0)
MCHC: 33.7 g/dL (ref 30.0–36.0)
MCV: 91.7 fL (ref 80.0–100.0)
Monocytes Absolute: 1 K/uL (ref 0.1–1.0)
Monocytes Relative: 6 %
Neutro Abs: 15.3 K/uL — ABNORMAL HIGH (ref 1.7–7.7)
Neutrophils Relative %: 87 %
Platelet Count: 618 K/uL — ABNORMAL HIGH (ref 150–400)
RBC: 4.08 MIL/uL (ref 3.87–5.11)
RDW: 13.1 % (ref 11.5–15.5)
WBC Count: 17.7 K/uL — ABNORMAL HIGH (ref 4.0–10.5)
nRBC: 0 % (ref 0.0–0.2)

## 2024-08-26 LAB — CMP (CANCER CENTER ONLY)
ALT: 47 U/L — ABNORMAL HIGH (ref 0–44)
AST: 64 U/L — ABNORMAL HIGH (ref 15–41)
Albumin: 3.3 g/dL — ABNORMAL LOW (ref 3.5–5.0)
Alkaline Phosphatase: 993 U/L — ABNORMAL HIGH (ref 38–126)
Anion gap: 6 (ref 5–15)
BUN: 21 mg/dL (ref 8–23)
CO2: 34 mmol/L — ABNORMAL HIGH (ref 22–32)
Calcium: 9.6 mg/dL (ref 8.9–10.3)
Chloride: 87 mmol/L — ABNORMAL LOW (ref 98–111)
Creatinine: 0.97 mg/dL (ref 0.44–1.00)
GFR, Estimated: 59 mL/min — ABNORMAL LOW (ref >60)
Glucose, Bld: 125 mg/dL — ABNORMAL HIGH (ref 70–99)
Potassium: 4.3 mmol/L (ref 3.5–5.1)
Sodium: 127 mmol/L — ABNORMAL LOW (ref 135–145)
Total Bilirubin: 0.9 mg/dL (ref 0.0–1.2)
Total Protein: 6.7 g/dL (ref 6.5–8.1)

## 2024-08-26 LAB — LACTATE DEHYDROGENASE: LDH: 201 U/L — ABNORMAL HIGH (ref 98–192)

## 2024-08-26 NOTE — Progress Notes (Signed)
 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 female returns to the clinic today for follow-up visit accompanied by her nephew.Discussed the use of AI scribe software for clinical note transcription with the patient, who gave verbal consent to proceed.  History of Present Illness Kara Newman is an 78 year old female with reactive thrombocytosis who presents for evaluation and repeat blood work. She is accompanied by her nephew, who she refers to as her son-in-law.  She has experienced several falls over the past year and describes significant weakness, to the point where she can barely walk. Her nephew adds that she was hospitalized with a pulmonary embolism and underwent a CT pulmonary angiogram, which raised concerns about either a multifocal lung tumor or an infectious process. A CT scan of her abdomen and pelvis suggested possible carcinomatosis, and she had ascites, which was drained, yielding clear yellow fluid. Cytology of the fluid was negative for malignancy.  She has been losing weight and experiencing increased weakness, even after being discharged from the hospital. Her nephew notes that she was able to walk a week ago but now struggles significantly. A CT angiogram of the chest from September 20th showed a linear segmental pulmonary embolism, evidence of right heart strain, multifocal patchy lung involvement bilaterally, and small bilateral pleural effusions. The CT of the abdomen revealed extensive peripheral nodular airspace disease throughout both lungs and a nodular liver. The paracentesis drained three liters of clear yellow fluid, and cytology showed atypical cells,  but it was inconclusive.  She continues to have elevated platelets and white blood cell count. Her abdomen has become more distended again. She also has elevated liver enzymes and alkaline phosphatase, and her lipase level was elevated at 485, although she has no history of pancreatitis.  No current pain is reported. She has experienced mental status changes and weakness.    MEDICAL HISTORY: Past Medical History:  Diagnosis Date   Breast cancer (HCC)    had left mastectomy.    COPD (chronic obstructive pulmonary disease) (HCC)    Endometriosis    History of colonic polyps 03/23/2021   IMO SNOMED Dx Update Oct 2024      ALLERGIES:  is allergic to ace inhibitors, benzalkonium chloride, levothyroxine  sodium, latex, and sulfa antibiotics.  MEDICATIONS:  Current Outpatient Medications  Medication Sig Dispense Refill   acetaminophen  (TYLENOL ) 500 MG tablet Take 500 mg by mouth every 6 (six) hours as needed for mild pain (pain score 1-3) or headache.     albuterol  (VENTOLIN  HFA) 108 (90 Base) MCG/ACT inhaler Inhale 2 puffs into the lungs every 4 (four) hours as needed for wheezing or shortness of breath. 72 each 3   APIXABAN  (ELIQUIS ) VTE STARTER PACK (10MG  AND 5MG ) Take as directed on package: start with two-5mg  tablets twice daily for 7 days. On day 8, switch to one-5mg  tablet twice daily. 74 each 0   budesonide -glycopyrrolate -formoterol  (BREZTRI  AEROSPHERE) 160-9-4.8 MCG/ACT AERO inhaler Inhale 2 puffs into the lungs in the morning and at bedtime. 3 each 3   Cholecalciferol 50 MCG (2000 UT) CAPS Take 2,000 Units by mouth in the morning.     furosemide  (LASIX ) 40 MG tablet Take 1 tablet (40 mg total) by mouth  daily. 30 tablet 5   furosemide  (LASIX ) 40 MG tablet Take 1 tablet (40 mg total) by mouth daily in the afternoon for 5 days. Extra dose 5 tablet 0   ipratropium-albuterol  (DUONEB) 0.5-2.5 (3) MG/3ML SOLN Take 3 mLs by nebulization every 6 (six) hours as needed. 360 mL 0   OXYGEN  Inhale 3 L into the lungs at bedtime.     potassium chloride  (KLOR-CON  M) 10 MEQ tablet Take 1 tablet (10 mEq total) by mouth daily for 5 days. 5 tablet 0   promethazine -dextromethorphan (PROMETHAZINE -DM) 6.25-15 MG/5ML syrup Take 5 mLs by mouth 4 (four) times daily as needed for cough. 118 mL 0   spironolactone  (ALDACTONE ) 50 MG tablet Take 1 tablet (50 mg total) by mouth in the morning. 30 tablet 0   SYNTHROID  100 MCG tablet Take 100 mcg by mouth every morning.     No current facility-administered medications for this visit.    SURGICAL HISTORY:  Past Surgical History:  Procedure Laterality Date   ABDOMINAL HYSTERECTOMY     MASTECTOMY      REVIEW OF SYSTEMS:  A comprehensive review of systems was negative except for: Constitutional: positive for fatigue   PHYSICAL EXAMINATION: General appearance: alert, cooperative, and fatigued Head: Normocephalic, without obvious abnormality, atraumatic Neck: no adenopathy, no JVD, supple, symmetrical, trachea midline, and thyroid  not enlarged, symmetric, no tenderness/mass/nodules Lymph nodes: Cervical, supraclavicular, and axillary nodes normal. Resp: clear to auscultation bilaterally Back: symmetric, no curvature. ROM normal. No CVA tenderness. Cardio: regular rate and rhythm, S1, S2 normal, no murmur, click, rub or gallop GI: soft, non-tender; bowel sounds normal; no masses,  no organomegaly Extremities: extremities normal, atraumatic, no cyanosis or edema  ECOG PERFORMANCE STATUS: 1 - Symptomatic but completely ambulatory  There were no vitals taken for this visit.  LABORATORY DATA: Lab Results  Component Value Date   WBC 17.7 (H) 08/26/2024   HGB 12.6 08/26/2024   HCT 37.4 08/26/2024   MCV 91.7 08/26/2024   PLT 618 (H) 08/26/2024      Chemistry      Component Value Date/Time   NA 126 (L) 08/12/2024 1220   K 3.5 08/12/2024 1220   CL 85 (L) 08/12/2024 1220   CO2 33 (H) 08/12/2024 1220   BUN 19 08/12/2024 1220   CREATININE  0.90 08/12/2024 1220   CREATININE 0.96 06/30/2023 1031      Component Value Date/Time   CALCIUM 9.3 08/12/2024 1220   ALKPHOS 187 (H) 08/05/2024 0505   AST 29 08/05/2024 0505   AST 52 (H) 06/30/2023 1031   ALT 19 08/05/2024 0505   ALT 70 (H) 06/30/2023 1031   BILITOT 0.5 08/05/2024 0505   BILITOT 0.7 06/30/2023 1031       RADIOGRAPHIC STUDIES:   ASSESSMENT AND PLAN: This is a very pleasant 78 years old white female with thrombocytosis likely reactive in nature but again myeloproliferative disorder could not be completely excluded.  The patient had JAK2 mutation panel that was normal but this is usually positive in around 65% of The patient with essential thrombocythemia. The patient was lost to follow-up for more than a year.  She was admitted to the hospital recently with suspicious malignant ascites and peritoneal carcinomatosis. 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  Ascites with possible peritoneal malignancy versus cirrhosis Ascites with differential diagnosis of peritoneal malignancy versus cirrhosis. Recent paracentesis showed clear yellow fluid with atypical cells present, but cytology was negative for  malignancy. Increasing abdominal distension suggests reaccumulation of fluid. - Order ultrasound-guided paracentesis for drainage and cytological analysis of ascitic fluid. - Order PET scan to evaluate for peritoneal malignancy.  Possible multifocal lung malignancy CT scan indicated multifocal lung involvement with a differential diagnosis of multifocal lung tumor versus infectious process. - Order PET scan to assess for multifocal lung malignancy.  Pulmonary embolism Recent pulmonary embolism confirmed by CT angiogram of the chest on September 20, showing linear segmental pulmonary embolism with evidence of right heart strain. Multifocal patchy lung involvement bilaterally  noted.  Elevated liver enzymes and nodular liver, possible cirrhosis Elevated liver enzymes and nodular liver observed on imaging, raising suspicion for cirrhosis. The etiology of liver dysfunction remains unclear. - Order PET scan to evaluate liver condition and rule out malignancy.  Bilateral pleural effusions Small bilateral pleural effusions noted on CT imaging. The etiology is unclear and may be related to underlying pulmonary or systemic conditions.  Elevated lipase without evidence of pancreatitis Elevated lipase level at 485, with no clinical signs of acute or chronic pancreatitis. The significance of this finding is uncertain.  Weakness and weight loss Significant weight loss and weakness reported, with deterioration noted after recent hospitalization. The cause is unclear and may be related to underlying malignancy or other systemic illness. She was advised to call immediately if she has any other concerning symptoms in the interval.   The patient voices understanding of current disease status and treatment options and is in agreement with the current care plan.  All questions were answered. The patient knows to call the clinic with any problems, questions or concerns. We can certainly see the patient much sooner if necessary.  The total time spent in the appointment was 55 minutes.  Disclaimer: This note was dictated with voice recognition software. Similar sounding words can inadvertently be transcribed and may not be corrected upon review.

## 2024-08-27 ENCOUNTER — Ambulatory Visit: Admitting: Nurse Practitioner

## 2024-08-27 ENCOUNTER — Other Ambulatory Visit: Payer: Self-pay | Admitting: Medical Oncology

## 2024-08-27 DIAGNOSIS — Z853 Personal history of malignant neoplasm of breast: Secondary | ICD-10-CM

## 2024-08-27 DIAGNOSIS — C786 Secondary malignant neoplasm of retroperitoneum and peritoneum: Secondary | ICD-10-CM

## 2024-08-27 DIAGNOSIS — R180 Malignant ascites: Secondary | ICD-10-CM

## 2024-08-28 ENCOUNTER — Ambulatory Visit (HOSPITAL_COMMUNITY)
Admission: RE | Admit: 2024-08-28 | Discharge: 2024-08-28 | Disposition: A | Discharge status: Home / Self Care | Source: Ambulatory Visit | Attending: Internal Medicine | Admitting: Internal Medicine

## 2024-08-28 DIAGNOSIS — Z853 Personal history of malignant neoplasm of breast: Secondary | ICD-10-CM | POA: Diagnosis not present

## 2024-08-28 DIAGNOSIS — R188 Other ascites: Secondary | ICD-10-CM | POA: Insufficient documentation

## 2024-08-28 HISTORY — PX: IR PARACENTESIS: IMG2679

## 2024-08-28 MED ORDER — LIDOCAINE HCL 1 % IJ SOLN
INTRAMUSCULAR | Status: AC
  Filled 2024-08-28: qty 20

## 2024-08-28 MED ORDER — LIDOCAINE HCL 1 % IJ SOLN
20.0000 mL | Freq: Once | INTRAMUSCULAR | Status: AC
  Administered 2024-08-28: 10 mL via INTRADERMAL

## 2024-08-28 NOTE — Procedures (Signed)
 Interventional Radiology Procedure Note  Procedure: Paracentesis  Complications: None  Estimated Blood Loss: < 10 mL  Findings: US  guided paracentesis for therapy and diagnostic.  Fluid obtained and sent to Pathology.  Kara Newman Banner, MD

## 2024-08-29 ENCOUNTER — Telehealth: Payer: Self-pay | Admitting: Internal Medicine

## 2024-08-29 NOTE — Telephone Encounter (Signed)
 Scheduled appointments with the patients son.

## 2024-09-01 ENCOUNTER — Ambulatory Visit: Admitting: Gastroenterology

## 2024-09-01 NOTE — Progress Notes (Deleted)
 Chief Complaint: Primary GI MD:  HPI: 78 y.o. female with medical history significant for COPD, left breast cancer s/p mastectomy, HTN, HLD, hypothyroidism, presents for evaluation of ascites  Patient presented to ED 08/02/24 with shortness of breath worse with exertion and lying flat. She was seen by pulmonology who felt it could be volume overload with her CHF so she was given lasix . Echocardiogram was ordered and performed on 07/30/2024. It showed EF 60-65%, no LV RWMA, G1DD, normal RV systolic function, moderately elevated PA systolic pressure.   CT abdomen/pelvis with contrast showed extensive peripheral nodular airspace disease throughout both lower lungs with small bilateral pleural effusions which could reflect infectious or neoplastic process.  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  Large volume ascites.  Abnormal infiltrative/nodular appearance of the omentum and the anterior abdomen, cannot exclude peritoneal malignancy/carcinomatosis.  Left colonic diverticulosis without diverticulitis.   CTA chest showed linear segmental PE in the left lung, favored to be chronic.  No evidence of right heart strain.  Multifocal patchy opacities in the lungs bilaterally, some of which may reflect infection/pneumonia although the appearance is concerning for underlying multifocal tumor.  Small bilateral pleural effusions, right greater than left also noted.  She was given IV abx and admitted  Recently seen by Oncology for thrombocytosis. Recent paracentesis for ascites showed clear yellow fluid with atypical cells present, but negative for malignancy though patient continues to have recurrent abdominal distention  She underwent another paracentesis 08/28/24 yielding 3L with cytology still pending.    Discussed the use of AI scribe software for clinical note transcription with the patient, who gave verbal consent to proceed.  History of Present Illness      PREVIOUS GI  WORKUP   CT abdomen/pelvis with contrast 08/02/24 showed extensive peripheral nodular airspace disease throughout both lower lungs with small bilateral pleural effusions which could reflect infectious or neoplastic process.  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  Large volume ascites.  Abnormal infiltrative/nodular appearance of the omentum and the anterior abdomen, cannot exclude peritoneal malignancy/carcinomatosis.  Left colonic diverticulosis without diverticulitis.   CTA chest showed linear segmental PE in the left lung, favored to be chronic.  No evidence of right heart strain.  Multifocal patchy opacities in the lungs bilaterally, some of which may reflect infection/pneumonia although the appearance is concerning for underlying multifocal tumor.  Small bilateral pleural effusions, right greater than left also noted.  Past Medical History:  Diagnosis Date   Breast cancer (HCC)    had left mastectomy.    COPD (chronic obstructive pulmonary disease) (HCC)    Endometriosis    History of colonic polyps 03/23/2021   IMO SNOMED Dx Update Oct 2024      Past Surgical History:  Procedure Laterality Date   ABDOMINAL HYSTERECTOMY     IR PARACENTESIS  08/28/2024   MASTECTOMY      Current Outpatient Medications  Medication Sig Dispense Refill   acetaminophen  (TYLENOL ) 500 MG tablet Take 500 mg by mouth every 6 (six) hours as needed for mild pain (pain score 1-3) or headache.     albuterol  (VENTOLIN  HFA) 108 (90 Base) MCG/ACT inhaler Inhale 2 puffs into the lungs every 4 (four) hours as needed for wheezing or shortness of breath. 72 each 3   APIXABAN  (ELIQUIS ) VTE STARTER PACK (10MG  AND 5MG ) Take as directed on package: start with two-5mg  tablets twice daily for 7 days. On day 8, switch to one-5mg  tablet twice daily. 74 each 0   budesonide -glycopyrrolate -formoterol  (  BREZTRI  AEROSPHERE) 160-9-4.8 MCG/ACT AERO inhaler Inhale 2 puffs into the lungs in the morning and at bedtime. 3  each 3   Cholecalciferol 50 MCG (2000 UT) CAPS Take 2,000 Units by mouth in the morning.     furosemide  (LASIX ) 40 MG tablet Take 1 tablet (40 mg total) by mouth daily. 30 tablet 5   furosemide  (LASIX ) 40 MG tablet Take 1 tablet (40 mg total) by mouth daily in the afternoon for 5 days. Extra dose 5 tablet 0   ipratropium-albuterol  (DUONEB) 0.5-2.5 (3) MG/3ML SOLN Take 3 mLs by nebulization every 6 (six) hours as needed. 360 mL 0   OXYGEN Inhale 3 L into the lungs at bedtime.     potassium chloride  (KLOR-CON  M) 10 MEQ tablet Take 1 tablet (10 mEq total) by mouth daily for 5 days. 5 tablet 0   promethazine -dextromethorphan (PROMETHAZINE -DM) 6.25-15 MG/5ML syrup Take 5 mLs by mouth 4 (four) times daily as needed for cough. (Patient not taking: Reported on 08/26/2024) 118 mL 0   spironolactone  (ALDACTONE ) 50 MG tablet Take 1 tablet (50 mg total) by mouth in the morning. 30 tablet 0   SYNTHROID  100 MCG tablet Take 100 mcg by mouth every morning.     No current facility-administered medications for this visit.    Allergies as of 09/01/2024 - Review Complete 08/12/2024  Allergen Reaction Noted   Ace inhibitors  05/07/2019   Benzalkonium chloride Other (See Comments) 04/16/2017   Levothyroxine  sodium Other (See Comments) 07/16/2020   Latex Rash 04/16/2017   Sulfa antibiotics Other (See Comments) 08/02/2024    Family History  Problem Relation Age of Onset   Lung cancer Mother     Social History   Socioeconomic History   Marital status: Married    Spouse name: Not on file   Number of children: Not on file   Years of education: Not on file   Highest education level: Not on file  Occupational History   Not on file  Tobacco Use   Smoking status: Former    Current packs/day: 0.00    Average packs/day: 1 pack/day for 38.0 years (38.0 ttl pk-yrs)    Types: Cigarettes    Start date: 11/06/1962    Quit date: 11/06/2000    Years since quitting: 23.8   Smokeless tobacco: Never  Vaping Use    Vaping status: Never Used  Substance and Sexual Activity   Alcohol use: Yes   Drug use: Never   Sexual activity: Yes    Birth control/protection: None, Surgical  Other Topics Concern   Not on file  Social History Narrative   Not on file   Social Drivers of Health   Financial Resource Strain: Not on file  Food Insecurity: No Food Insecurity (08/02/2024)   Hunger Vital Sign    Worried About Running Out of Food in the Last Year: Never true    Ran Out of Food in the Last Year: Never true  Transportation Needs: No Transportation Needs (08/02/2024)   PRAPARE - Administrator, Civil Service (Medical): No    Lack of Transportation (Non-Medical): No  Physical Activity: Not on file  Stress: Not on file  Social Connections: Moderately Isolated (08/02/2024)   Social Connection and Isolation Panel    Frequency of Communication with Friends and Family: More than three times a week    Frequency of Social Gatherings with Friends and Family: Three times a week    Attends Religious Services: Never    Active Member  of Clubs or Organizations: Yes    Attends Banker Meetings: More than 4 times per year    Marital Status: Widowed  Intimate Partner Violence: Not At Risk (08/02/2024)   Humiliation, Afraid, Rape, and Kick questionnaire    Fear of Current or Ex-Partner: No    Emotionally Abused: No    Physically Abused: No    Sexually Abused: No    Review of Systems:    Constitutional: No weight loss, fever, chills, weakness or fatigue HEENT: Eyes: No change in vision               Ears, Nose, Throat:  No change in hearing or congestion Skin: No rash or itching Cardiovascular: No chest pain, chest pressure or palpitations   Respiratory: No SOB or cough Gastrointestinal: See HPI and otherwise negative Genitourinary: No dysuria or change in urinary frequency Neurological: No headache, dizziness or syncope Musculoskeletal: No new muscle or joint pain Hematologic: No  bleeding or bruising Psychiatric: No history of depression or anxiety    Physical Exam:  Vital signs: There were no vitals taken for this visit.  Constitutional: NAD, alert and cooperative Head:  Normocephalic and atraumatic. Eyes:   PEERL, EOMI. No icterus. Conjunctiva pink. Respiratory: Respirations even and unlabored. Lungs clear to auscultation bilaterally.   No wheezes, crackles, or rhonchi.  Cardiovascular:  Regular rate and rhythm. No peripheral edema, cyanosis or pallor.  Gastrointestinal:  Soft, nondistended, nontender. No rebound or guarding. Normal bowel sounds. No appreciable masses or hepatomegaly. Rectal:  Declines Msk:  Symmetrical without gross deformities. Without edema, no deformity or joint abnormality.  Neurologic:  Alert and  oriented x4;  grossly normal neurologically.  Skin:   Dry and intact without significant lesions or rashes. Psychiatric: Oriented to person, place and time. Demonstrates good judgement and reason without abnormal affect or behaviors.  Physical Exam    RELEVANT LABS AND IMAGING: CBC    Component Value Date/Time   WBC 17.7 (H) 08/26/2024 1431   WBC 15.3 (H) 08/06/2024 0509   RBC 4.08 08/26/2024 1431   HGB 12.6 08/26/2024 1431   HCT 37.4 08/26/2024 1431   PLT 618 (H) 08/26/2024 1431   MCV 91.7 08/26/2024 1431   MCH 30.9 08/26/2024 1431   MCHC 33.7 08/26/2024 1431   RDW 13.1 08/26/2024 1431   LYMPHSABS 1.1 08/26/2024 1431   MONOABS 1.0 08/26/2024 1431   EOSABS 0.1 08/26/2024 1431   BASOSABS 0.1 08/26/2024 1431    CMP     Component Value Date/Time   NA 127 (L) 08/26/2024 1431   K 4.3 08/26/2024 1431   CL 87 (L) 08/26/2024 1431   CO2 34 (H) 08/26/2024 1431   GLUCOSE 125 (H) 08/26/2024 1431   BUN 21 08/26/2024 1431   CREATININE 0.97 08/26/2024 1431   CALCIUM 9.6 08/26/2024 1431   PROT 6.7 08/26/2024 1431   ALBUMIN 3.3 (L) 08/26/2024 1431   AST 64 (H) 08/26/2024 1431   ALT 47 (H) 08/26/2024 1431   ALKPHOS 993 (H) 08/26/2024  1431   BILITOT 0.9 08/26/2024 1431   GFRNONAA 59 (L) 08/26/2024 1431     Assessment/Plan:   Assessment and Plan Assessment & Plan    Ascites (malignant versus cirrhotic) CTAP w contrast 08/02/24 with infectious versus neoplastic process in bilateral lungs, cirrhosis, ascites, Abnormal infiltrative/nodular appearance of the omentum and the anterior abdomen, cannot exclude peritoneal malignancy/carcinomatosis.  08/03/24 3 L removed: Clear yellow fluid with atypical cells present, but negative for malignancy 08/28/24 3 liters removed -  cytology pending PET scan planned for 09/05/24 -- wait for PET scan results  Elevated LFTs Increasing LFTs with most recent draw 08/26/24 of AST 64/ ALT 47/ Alk phos 993 with imaging consistent with possible cirrhosis and ascites of unclear etiology with fluid cytology pending.     Nestor Blower, PA-C [facility] 09/01/2024, 10:09 AM  Cc: Varadarajan, Rupashree

## 2024-09-03 LAB — CYTOLOGY - NON PAP

## 2024-09-05 ENCOUNTER — Encounter (HOSPITAL_COMMUNITY)
Admission: RE | Admit: 2024-09-05 | Discharge: 2024-09-05 | Disposition: A | Discharge status: Home / Self Care | Source: Ambulatory Visit | Attending: Internal Medicine | Admitting: Internal Medicine

## 2024-09-05 DIAGNOSIS — Z853 Personal history of malignant neoplasm of breast: Secondary | ICD-10-CM | POA: Insufficient documentation

## 2024-09-05 DIAGNOSIS — R180 Malignant ascites: Secondary | ICD-10-CM | POA: Insufficient documentation

## 2024-09-05 DIAGNOSIS — R0603 Acute respiratory distress: Secondary | ICD-10-CM | POA: Diagnosis not present

## 2024-09-05 DIAGNOSIS — C786 Secondary malignant neoplasm of retroperitoneum and peritoneum: Secondary | ICD-10-CM | POA: Insufficient documentation

## 2024-09-05 DIAGNOSIS — J189 Pneumonia, unspecified organism: Secondary | ICD-10-CM | POA: Diagnosis not present

## 2024-09-05 LAB — GLUCOSE, CAPILLARY: Glucose-Capillary: 119 mg/dL — ABNORMAL HIGH (ref 70–99)

## 2024-09-05 MED ORDER — FLUDEOXYGLUCOSE F - 18 (FDG) INJECTION
6.2000 | Freq: Once | INTRAVENOUS | Status: AC | PRN
  Administered 2024-09-05: 6.29 via INTRAVENOUS

## 2024-09-08 ENCOUNTER — Other Ambulatory Visit: Payer: Self-pay

## 2024-09-08 ENCOUNTER — Encounter (HOSPITAL_COMMUNITY): Payer: Self-pay | Admitting: Hospitalist

## 2024-09-08 ENCOUNTER — Emergency Department (HOSPITAL_COMMUNITY)

## 2024-09-08 ENCOUNTER — Inpatient Hospital Stay (HOSPITAL_COMMUNITY)
Admission: EM | Admit: 2024-09-08 | Discharge: 2024-09-12 | DRG: 193 | Disposition: A | Discharge status: Hospice | Attending: Internal Medicine | Admitting: Internal Medicine

## 2024-09-08 DIAGNOSIS — Z1152 Encounter for screening for COVID-19: Secondary | ICD-10-CM | POA: Diagnosis not present

## 2024-09-08 DIAGNOSIS — E8770 Fluid overload, unspecified: Secondary | ICD-10-CM | POA: Diagnosis present

## 2024-09-08 DIAGNOSIS — J441 Chronic obstructive pulmonary disease with (acute) exacerbation: Principal | ICD-10-CM | POA: Diagnosis present

## 2024-09-08 DIAGNOSIS — E875 Hyperkalemia: Secondary | ICD-10-CM | POA: Diagnosis present

## 2024-09-08 DIAGNOSIS — J189 Pneumonia, unspecified organism: Secondary | ICD-10-CM | POA: Diagnosis present

## 2024-09-08 DIAGNOSIS — Z79890 Hormone replacement therapy (postmenopausal): Secondary | ICD-10-CM

## 2024-09-08 DIAGNOSIS — J9601 Acute respiratory failure with hypoxia: Secondary | ICD-10-CM | POA: Diagnosis not present

## 2024-09-08 DIAGNOSIS — Z515 Encounter for palliative care: Secondary | ICD-10-CM | POA: Diagnosis not present

## 2024-09-08 DIAGNOSIS — C787 Secondary malignant neoplasm of liver and intrahepatic bile duct: Secondary | ICD-10-CM | POA: Diagnosis present

## 2024-09-08 DIAGNOSIS — D630 Anemia in neoplastic disease: Secondary | ICD-10-CM | POA: Diagnosis present

## 2024-09-08 DIAGNOSIS — R0603 Acute respiratory distress: Secondary | ICD-10-CM | POA: Diagnosis present

## 2024-09-08 DIAGNOSIS — K7460 Unspecified cirrhosis of liver: Secondary | ICD-10-CM | POA: Diagnosis present

## 2024-09-08 DIAGNOSIS — Z86711 Personal history of pulmonary embolism: Secondary | ICD-10-CM

## 2024-09-08 DIAGNOSIS — I10 Essential (primary) hypertension: Secondary | ICD-10-CM | POA: Diagnosis present

## 2024-09-08 DIAGNOSIS — Z860100 Personal history of colon polyps, unspecified: Secondary | ICD-10-CM

## 2024-09-08 DIAGNOSIS — E785 Hyperlipidemia, unspecified: Secondary | ICD-10-CM | POA: Diagnosis present

## 2024-09-08 DIAGNOSIS — Z91040 Latex allergy status: Secondary | ICD-10-CM

## 2024-09-08 DIAGNOSIS — J440 Chronic obstructive pulmonary disease with acute lower respiratory infection: Secondary | ICD-10-CM | POA: Diagnosis present

## 2024-09-08 DIAGNOSIS — Z66 Do not resuscitate: Secondary | ICD-10-CM | POA: Diagnosis present

## 2024-09-08 DIAGNOSIS — C801 Malignant (primary) neoplasm, unspecified: Secondary | ICD-10-CM | POA: Diagnosis present

## 2024-09-08 DIAGNOSIS — Z7901 Long term (current) use of anticoagulants: Secondary | ICD-10-CM

## 2024-09-08 DIAGNOSIS — Z888 Allergy status to other drugs, medicaments and biological substances status: Secondary | ICD-10-CM

## 2024-09-08 DIAGNOSIS — C786 Secondary malignant neoplasm of retroperitoneum and peritoneum: Secondary | ICD-10-CM | POA: Diagnosis present

## 2024-09-08 DIAGNOSIS — J9621 Acute and chronic respiratory failure with hypoxia: Principal | ICD-10-CM | POA: Diagnosis present

## 2024-09-08 DIAGNOSIS — L89621 Pressure ulcer of left heel, stage 1: Secondary | ICD-10-CM | POA: Diagnosis present

## 2024-09-08 DIAGNOSIS — E871 Hypo-osmolality and hyponatremia: Secondary | ICD-10-CM | POA: Diagnosis present

## 2024-09-08 DIAGNOSIS — Z853 Personal history of malignant neoplasm of breast: Secondary | ICD-10-CM

## 2024-09-08 DIAGNOSIS — E039 Hypothyroidism, unspecified: Secondary | ICD-10-CM | POA: Diagnosis present

## 2024-09-08 DIAGNOSIS — Z6820 Body mass index (BMI) 20.0-20.9, adult: Secondary | ICD-10-CM

## 2024-09-08 DIAGNOSIS — L89151 Pressure ulcer of sacral region, stage 1: Secondary | ICD-10-CM | POA: Diagnosis present

## 2024-09-08 DIAGNOSIS — Z79899 Other long term (current) drug therapy: Secondary | ICD-10-CM

## 2024-09-08 DIAGNOSIS — Z7952 Long term (current) use of systemic steroids: Secondary | ICD-10-CM

## 2024-09-08 DIAGNOSIS — Z9981 Dependence on supplemental oxygen: Secondary | ICD-10-CM

## 2024-09-08 DIAGNOSIS — L89611 Pressure ulcer of right heel, stage 1: Secondary | ICD-10-CM | POA: Diagnosis present

## 2024-09-08 DIAGNOSIS — Z9012 Acquired absence of left breast and nipple: Secondary | ICD-10-CM

## 2024-09-08 DIAGNOSIS — Z801 Family history of malignant neoplasm of trachea, bronchus and lung: Secondary | ICD-10-CM

## 2024-09-08 DIAGNOSIS — E43 Unspecified severe protein-calorie malnutrition: Secondary | ICD-10-CM | POA: Diagnosis present

## 2024-09-08 DIAGNOSIS — Z7189 Other specified counseling: Secondary | ICD-10-CM | POA: Diagnosis not present

## 2024-09-08 DIAGNOSIS — Z87891 Personal history of nicotine dependence: Secondary | ICD-10-CM

## 2024-09-08 DIAGNOSIS — L8990 Pressure ulcer of unspecified site, unspecified stage: Secondary | ICD-10-CM | POA: Insufficient documentation

## 2024-09-08 DIAGNOSIS — F410 Panic disorder [episodic paroxysmal anxiety] without agoraphobia: Secondary | ICD-10-CM | POA: Diagnosis present

## 2024-09-08 DIAGNOSIS — J90 Pleural effusion, not elsewhere classified: Secondary | ICD-10-CM | POA: Diagnosis present

## 2024-09-08 DIAGNOSIS — Z882 Allergy status to sulfonamides status: Secondary | ICD-10-CM

## 2024-09-08 DIAGNOSIS — Z7951 Long term (current) use of inhaled steroids: Secondary | ICD-10-CM

## 2024-09-08 HISTORY — DX: Heart failure, unspecified: I50.9

## 2024-09-08 LAB — BRAIN NATRIURETIC PEPTIDE: B Natriuretic Peptide: 90.4 pg/mL (ref 0.0–100.0)

## 2024-09-08 LAB — RESP PANEL BY RT-PCR (RSV, FLU A&B, COVID)  RVPGX2
Influenza A by PCR: NEGATIVE
Influenza B by PCR: NEGATIVE
Resp Syncytial Virus by PCR: NEGATIVE
SARS Coronavirus 2 by RT PCR: NEGATIVE

## 2024-09-08 LAB — I-STAT VENOUS BLOOD GAS, ED
Acid-Base Excess: 6 mmol/L — ABNORMAL HIGH (ref 0.0–2.0)
Bicarbonate: 33.9 mmol/L — ABNORMAL HIGH (ref 20.0–28.0)
Calcium, Ion: 1.11 mmol/L — ABNORMAL LOW (ref 1.15–1.40)
HCT: 39 % (ref 36.0–46.0)
Hemoglobin: 13.3 g/dL (ref 12.0–15.0)
O2 Saturation: 99 %
Potassium: 4.8 mmol/L (ref 3.5–5.1)
Sodium: 125 mmol/L — ABNORMAL LOW (ref 135–145)
TCO2: 36 mmol/L — ABNORMAL HIGH (ref 22–32)
pCO2, Ven: 63.2 mmHg — ABNORMAL HIGH (ref 44–60)
pH, Ven: 7.337 (ref 7.25–7.43)
pO2, Ven: 166 mmHg — ABNORMAL HIGH (ref 32–45)

## 2024-09-08 LAB — COMPREHENSIVE METABOLIC PANEL WITH GFR
ALT: 103 U/L — ABNORMAL HIGH (ref 0–44)
AST: 155 U/L — ABNORMAL HIGH (ref 15–41)
Albumin: 2.3 g/dL — ABNORMAL LOW (ref 3.5–5.0)
Alkaline Phosphatase: 118 U/L (ref 38–126)
Anion gap: 10 (ref 5–15)
BUN: 36 mg/dL — ABNORMAL HIGH (ref 8–23)
CO2: 29 mmol/L (ref 22–32)
Calcium: 8.3 mg/dL — ABNORMAL LOW (ref 8.9–10.3)
Chloride: 86 mmol/L — ABNORMAL LOW (ref 98–111)
Creatinine, Ser: 1.12 mg/dL — ABNORMAL HIGH (ref 0.44–1.00)
GFR, Estimated: 50 mL/min — ABNORMAL LOW
Glucose, Bld: 129 mg/dL — ABNORMAL HIGH (ref 70–99)
Potassium: 5 mmol/L (ref 3.5–5.1)
Sodium: 125 mmol/L — ABNORMAL LOW (ref 135–145)
Total Bilirubin: 1.5 mg/dL — ABNORMAL HIGH (ref 0.0–1.2)
Total Protein: 6.1 g/dL — ABNORMAL LOW (ref 6.5–8.1)

## 2024-09-08 LAB — CBC WITH DIFFERENTIAL/PLATELET
Abs Immature Granulocytes: 0.19 10*3/uL — ABNORMAL HIGH (ref 0.00–0.07)
Basophils Absolute: 0.1 10*3/uL (ref 0.0–0.1)
Basophils Relative: 0 %
Eosinophils Absolute: 0 10*3/uL (ref 0.0–0.5)
Eosinophils Relative: 0 %
HCT: 37.3 % (ref 36.0–46.0)
Hemoglobin: 12.3 g/dL (ref 12.0–15.0)
Immature Granulocytes: 1 %
Lymphocytes Relative: 15 %
Lymphs Abs: 3.2 10*3/uL (ref 0.7–4.0)
MCH: 31.2 pg (ref 26.0–34.0)
MCHC: 33 g/dL (ref 30.0–36.0)
MCV: 94.7 fL (ref 80.0–100.0)
Monocytes Absolute: 1.3 10*3/uL — ABNORMAL HIGH (ref 0.1–1.0)
Monocytes Relative: 6 %
Neutro Abs: 17.5 10*3/uL — ABNORMAL HIGH (ref 1.7–7.7)
Neutrophils Relative %: 78 %
Platelets: 695 10*3/uL — ABNORMAL HIGH (ref 150–400)
RBC: 3.94 MIL/uL (ref 3.87–5.11)
RDW: 13.6 % (ref 11.5–15.5)
WBC: 22.3 10*3/uL — ABNORMAL HIGH (ref 4.0–10.5)
nRBC: 0 % (ref 0.0–0.2)

## 2024-09-08 MED ORDER — OXYCODONE HCL 5 MG PO TABS
5.0000 mg | ORAL_TABLET | Freq: Four times a day (QID) | ORAL | Status: DC | PRN
  Administered 2024-09-10 – 2024-09-12 (×4): 5 mg via ORAL
  Filled 2024-09-08 (×5): qty 1

## 2024-09-08 MED ORDER — ENOXAPARIN SODIUM 40 MG/0.4ML IJ SOSY
40.0000 mg | PREFILLED_SYRINGE | INTRAMUSCULAR | Status: DC
  Administered 2024-09-08: 40 mg via SUBCUTANEOUS
  Filled 2024-09-08: qty 0.4

## 2024-09-08 MED ORDER — METHYLPREDNISOLONE SODIUM SUCC 40 MG IJ SOLR
40.0000 mg | INTRAMUSCULAR | Status: DC
  Administered 2024-09-08: 40 mg via INTRAVENOUS
  Filled 2024-09-08: qty 1

## 2024-09-08 MED ORDER — METHYLPREDNISOLONE SODIUM SUCC 40 MG IJ SOLR
40.0000 mg | INTRAMUSCULAR | Status: AC
  Administered 2024-09-09: 40 mg via INTRAVENOUS
  Filled 2024-09-08: qty 1

## 2024-09-08 MED ORDER — FUROSEMIDE 40 MG PO TABS
40.0000 mg | ORAL_TABLET | Freq: Every day | ORAL | Status: DC
  Administered 2024-09-09 – 2024-09-12 (×4): 40 mg via ORAL
  Filled 2024-09-08 (×2): qty 1
  Filled 2024-09-08: qty 2
  Filled 2024-09-08: qty 1

## 2024-09-08 MED ORDER — IPRATROPIUM-ALBUTEROL 0.5-2.5 (3) MG/3ML IN SOLN
3.0000 mL | Freq: Once | RESPIRATORY_TRACT | Status: AC
Start: 2024-09-08 — End: 2024-09-08
  Administered 2024-09-08: 3 mL via RESPIRATORY_TRACT
  Filled 2024-09-08: qty 3

## 2024-09-08 MED ORDER — PREDNISONE 20 MG PO TABS
40.0000 mg | ORAL_TABLET | Freq: Every day | ORAL | Status: AC
  Administered 2024-09-09 – 2024-09-12 (×4): 40 mg via ORAL
  Filled 2024-09-08 (×4): qty 2

## 2024-09-08 MED ORDER — BUDESON-GLYCOPYRROL-FORMOTEROL 160-9-4.8 MCG/ACT IN AERO
2.0000 | INHALATION_SPRAY | Freq: Two times a day (BID) | RESPIRATORY_TRACT | Status: DC
  Administered 2024-09-09 – 2024-09-11 (×5): 2 via RESPIRATORY_TRACT
  Filled 2024-09-08: qty 5.9

## 2024-09-08 MED ORDER — IPRATROPIUM-ALBUTEROL 0.5-2.5 (3) MG/3ML IN SOLN
3.0000 mL | RESPIRATORY_TRACT | Status: DC
  Administered 2024-09-08 – 2024-09-09 (×9): 3 mL via RESPIRATORY_TRACT
  Filled 2024-09-08 (×2): qty 3
  Filled 2024-09-08: qty 6
  Filled 2024-09-08 (×5): qty 3

## 2024-09-08 MED ORDER — SODIUM CHLORIDE 0.9 % IV SOLN
1.0000 g | INTRAVENOUS | Status: DC
  Administered 2024-09-09 – 2024-09-12 (×4): 1 g via INTRAVENOUS
  Filled 2024-09-08 (×4): qty 10

## 2024-09-08 MED ORDER — AZITHROMYCIN 250 MG PO TABS
500.0000 mg | ORAL_TABLET | Freq: Every day | ORAL | Status: AC
  Administered 2024-09-09 – 2024-09-10 (×2): 500 mg via ORAL
  Filled 2024-09-08 (×2): qty 2

## 2024-09-08 MED ORDER — SODIUM CHLORIDE 0.9 % IV SOLN
500.0000 mg | Freq: Once | INTRAVENOUS | Status: AC
  Administered 2024-09-08: 500 mg via INTRAVENOUS
  Filled 2024-09-08: qty 5

## 2024-09-08 MED ORDER — LEVOTHYROXINE SODIUM 100 MCG PO TABS
100.0000 ug | ORAL_TABLET | Freq: Every morning | ORAL | Status: DC
  Administered 2024-09-09 – 2024-09-12 (×4): 100 ug via ORAL
  Filled 2024-09-08 (×4): qty 1

## 2024-09-08 MED ORDER — SODIUM CHLORIDE 0.9 % IV SOLN
2.0000 g | Freq: Once | INTRAVENOUS | Status: AC
  Administered 2024-09-08: 2 g via INTRAVENOUS
  Filled 2024-09-08: qty 20

## 2024-09-08 MED ORDER — APIXABAN 5 MG PO TABS
5.0000 mg | ORAL_TABLET | Freq: Two times a day (BID) | ORAL | Status: DC
  Administered 2024-09-09 – 2024-09-12 (×7): 5 mg via ORAL
  Filled 2024-09-08 (×7): qty 1

## 2024-09-08 MED ORDER — ONDANSETRON HCL 4 MG/2ML IJ SOLN
4.0000 mg | Freq: Once | INTRAMUSCULAR | Status: AC | PRN
  Administered 2024-09-10: 4 mg via INTRAVENOUS
  Filled 2024-09-08: qty 2

## 2024-09-08 MED ORDER — SPIRONOLACTONE 12.5 MG HALF TABLET: 50.0000 mg | ORAL_TABLET | Freq: Every morning | ORAL | Status: DC

## 2024-09-08 NOTE — ED Provider Notes (Signed)
 I saw and evaluated the patient, reviewed the resident's note and I agree with the findings and plan.      This is 78 year old female with history of COPD presents with shortness of breath.  Was found to have a pulse ox of 76% on her home O2 of 3 L.  Had wheezing.  Given treatment by EMS albuterol  mag and Atrovent.  Placed on BiPAP.  Will check chest x-ray and labs and patient will be admitted   Dasie Faden, MD 09/08/24 [phone]

## 2024-09-08 NOTE — ED Notes (Signed)
 RN contacted Dr Georgina about holding PO medications at this time due to patient being on Bi-pap. Dr Georgina contacted RN back reporting that holding morning PO medicated was fine. No further instructions at this time.

## 2024-09-08 NOTE — ED Provider Notes (Signed)
 [place] [facility] AT [facility] Provider Note  MDM   HPI/ROS:  Kara Newman is a 78 y.o. female with a PMHx COPD on 3 L baseline, left breast cancer s/p mastectomy, HTN, HLD, hypothyroidism who presents with shortness of breath.  Patient BIBEMS from home with SOB that started yesterday and worsened this morning.  Patient received 10 mg albuterol , 4 g of mag, 1 Atrovent and 125 mg of Solu-Medrol and route with EMS.  Patient denies any fevers, chills, cough, increase in swelling.  Differentials include COPD exacerbation, pneumonia/infection, sepsis, PE, ACS, CHF exacerbation  On my initial evaluation, patient is:  -Vital signs stable. Patient afebrile, hemodynamically stable, and ill-appearing with mild respiratory distress.  Patient unable to speak in full sentences, increased WOB with abdominal breathing, saturating 100% on a nonrebreather.   Physical exam is notable for: - Poor air movement bilaterally with prolonged expiratory phase and end expiratory wheezes -- No lower extremity edema, no periorbital edema -- Abdomen is distended but soft, nonrigid, nontender, nonperitonitic  -Additional history obtained from EMR.  Patient was admitted 08/02/2024 to 08/07/2024 for similar SOB.  At that time she had developed some increasing abdominal swelling as well as lower extremity swelling, there was concern for volume overload due to possible CHF and she was started on a 5-day course of Lasix , without significant difference.  Echo on 9/17 showed EF of 60 to 65% with no LV RWMA, G1 DD, normal RV systolic function, moderately elevated PA systolic pressure.  At that time, patient had a CTA chest which showed a chronic PE as well as concern for multifocal tumor.  Patient initiated on BiPAP, albuterol  (Solu-Medrol and magnesium given en route).  Given that patient is wheezing and had symptomatic improvement after breathing treatments given by EMS, likely to be a COPD exacerbation.  No  overt signs of volume overload, despite patient's distended abdomen, it is soft and not tender therefore low suspicion for SBP or intra-abdominal infection, patient not reporting history of N/V/D.  WBC of 22.3 with neutrophil predominance, VBG 7.337/63.2/33.9.  I-STAT VBG with sodium of 125 potassium of 4.8.  CXR with patchy consolidations bilaterally as well as bilateral pleural effusions.  Does have a left opacity which she appears improved from prior x-rays.  Patient given azithromycin  and Rocephin .  Interpretations, interventions, and the patient's course of care are documented below.        Disposition:  I discussed the case with hospitalist who graciously agreed to admit the patient to their service for continued care.   Clinical Impression:  1. COPD exacerbation (HCC)   2. Acute respiratory failure with hypoxia (HCC)   3. Respiratory distress     Clinical Complexity A medically appropriate history, review of systems, and physical exam was performed.  My independent interpretations of EKG, labs, and radiology are documented in the ED course above.   If decision rules were used in this patient's evaluation, they are listed below.   Click here for ABCD2, HEART and other calculatorsREFRESH Note before signing   Patient's presentation is most consistent with acute presentation with potential threat to life or bodily function.  Medical Decision Making Amount and/or Complexity of Data Reviewed Labs: ordered. Radiology: ordered.  Risk Prescription drug management. Decision regarding hospitalization.    HPI/ROS      See MDM section for pertinent HPI and ROS. A complete ROS was performed with pertinent positives/negatives noted above.   Past Medical History:  Diagnosis Date   Breast cancer (HCC)  had left mastectomy.    COPD (chronic obstructive pulmonary disease) (HCC)    Endometriosis    History of colonic polyps 03/23/2021   IMO SNOMED Dx Update Oct 2024       Past Surgical History:  Procedure Laterality Date   ABDOMINAL HYSTERECTOMY     IR PARACENTESIS  08/28/2024   MASTECTOMY        Physical Exam   Vitals:   09/08/24 0718 09/08/24 0719  BP:  112/69  Pulse:  (!) 103  Resp:  (!) 28  SpO2:  100%  Weight: 59 kg   Height: 5' 6 (1.676 m)     Physical Exam Vitals and nursing note reviewed.  Constitutional:      General: She is in acute distress.     Appearance: She is well-developed. She is ill-appearing.  HENT:     Head: Normocephalic and atraumatic.  Eyes:     Conjunctiva/sclera: Conjunctivae normal.  Cardiovascular:     Rate and Rhythm: Normal rate and regular rhythm.     Heart sounds: No murmur heard. Pulmonary:     Comments: unable to speak in full sentences, increased WOB with abdominal breathing, saturating 100% on a nonrebreather.  Poor air movement bilaterally with prolonged expiratory phase and end expiratory wheezes  Abdominal:     Palpations: Abdomen is soft.     Tenderness: There is no abdominal tenderness.     Comments: Abdomen is distended but soft, nonrigid, nontender, nonperitoniti  Musculoskeletal:        General: No swelling.     Cervical back: Neck supple.  Skin:    General: Skin is warm and dry.     Capillary Refill: Capillary refill takes less than 2 seconds.  Neurological:     Mental Status: She is alert and oriented to person, place, and time.  Psychiatric:        Mood and Affect: Mood normal.      Procedures   If procedures were preformed on this patient, they are listed below:  Procedures   Please note that this documentation was produced with the assistance of voice-to-text technology and may contain errors.     Billy Pal, MD 09/08/24 1333    Dasie Faden, MD 09/09/24 [phone]

## 2024-09-08 NOTE — H&P (Signed)
 History and Physical    Patient: Kara Newman FMW:[id number] DOB: 29-Oct-1946 DOA: 09/08/2024 DOS: the patient was seen and examined on 09/08/2024 PCP: Elliot Charm (Inactive)  Patient coming from: Home  Chief Complaint:  Chief Complaint  Patient presents with   Respiratory Distress   HPI: Kara Newman is a 78 y.o. 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 Sherrod) who p/w SOB 2/2 AECOPD.  Pt is a limited historian and unable to provide detailed history due to ongoing SOB/DOE and BiPAP in place. From what I can gather per Epic reiview and brief interview, pt endorsed SOB for the past week that acutely worsening this morning prompting her to activate EMS. Per her report, she has been taking her newly prescribed blood thinners (Eliquis ), and diuretics (lasix  and aldactone ) as prescribed.  In the ED, pt tchycardic and tachypneic w/ hypoxia requiring BiPAP. Labs notable for VBG pH/CO2 7.33/63, Na 125, BNP 90, and WBC 22. RVP neg for RSV, influenza, and COVID-19. EDP started IV CTX/azithromycin  x1, administered IV solumedrol, and requested medicine admission.   Review of Systems: As mentioned in the history of present illness. All other systems reviewed and are negative. Past Medical History:  Diagnosis Date   Breast cancer (HCC)    had left mastectomy.    COPD (chronic obstructive pulmonary disease) (HCC)    Endometriosis    History of colonic polyps 03/23/2021   IMO SNOMED Dx Update Oct 2024     Past Surgical History:  Procedure Laterality Date   ABDOMINAL HYSTERECTOMY     IR PARACENTESIS  08/28/2024   MASTECTOMY     Social History:  reports that she quit smoking about 23 years ago. Her smoking use included cigarettes. She started smoking about  61 years ago. She has a 38 pack-year smoking history. She has never used smokeless tobacco. She reports current alcohol use. She reports that she does not use drugs.  Allergies  Allergen Reactions   Ace Inhibitors     Other reaction(s): Cough (ALLERGY/intolerance)   Benzalkonium Chloride Other (See Comments)    Other reaction(s): Other (See Comments), Other (See Comments) Caused an infection Caused an infection    Levothyroxine  Sodium Other (See Comments)    Other reaction(s): troubles in the past, wants brand Synthroid    Latex Rash   Sulfa Antibiotics Other (See Comments)    Patient cannot recall if she has an allergy.  She does not take this medication due to it being ineffective.     Family History  Problem Relation Age of Onset   Lung cancer Mother     Prior to Admission medications   Medication Sig Start Date End Date Taking? Authorizing Provider  acetaminophen  (TYLENOL ) 500 MG tablet Take 500 mg by mouth every 6 (six) hours as needed for mild pain (pain score 1-3) or headache.    [provider]  albuterol  (VENTOLIN  HFA) 108 (90 Base) MCG/ACT inhaler Inhale 2 puffs into the lungs every 4 (four) hours as needed for wheezing or shortness of breath. 04/24/24   Meade Verdon RAMAN, MD  APIXABAN  (ELIQUIS ) VTE STARTER PACK (10MG  AND 5MG ) Take as directed on package: start with two-5mg  tablets twice daily for 7 days. On day 8, switch to one-5mg  tablet twice daily. 08/06/24   Laurence Locus, DO  budesonide -glycopyrrolate -formoterol  (BREZTRI  AEROSPHERE)  160-9-4.8 MCG/ACT AERO inhaler Inhale 2 puffs into the lungs in the morning and at bedtime. 04/24/24   Desai, Nikita S, MD  Cholecalciferol 50 MCG (2000 UT) CAPS Take 2,000 Units by mouth in the morning. 02/03/22   [provider]  furosemide  (LASIX ) 40 MG tablet Take 1 tablet (40 mg total) by mouth daily. 07/24/24   Cobb, Comer GAILS, NP  furosemide  (LASIX ) 40 MG tablet Take 1 tablet (40 mg total) by mouth daily in the afternoon for  5 days. Extra dose 08/13/24 08/18/24  Cobb, Comer GAILS, NP  ipratropium-albuterol  (DUONEB) 0.5-2.5 (3) MG/3ML SOLN Take 3 mLs by nebulization every 6 (six) hours as needed. 08/06/24 09/05/24  Laurence Locus, DO  OXYGEN Inhale 3 L into the lungs at bedtime.    [provider]  potassium chloride  (KLOR-CON  M) 10 MEQ tablet Take 1 tablet (10 mEq total) by mouth daily for 5 days. 08/13/24 08/26/24  Cobb, Comer GAILS, NP  promethazine -dextromethorphan (PROMETHAZINE -DM) 6.25-15 MG/5ML syrup Take 5 mLs by mouth 4 (four) times daily as needed for cough. Patient not taking: Reported on 08/26/2024 07/04/24   Meade Verdon RAMAN, MD  spironolactone  (ALDACTONE ) 50 MG tablet Take 1 tablet (50 mg total) by mouth in the morning. 08/06/24 09/05/24  Laurence Locus, DO  SYNTHROID  100 MCG tablet Take 100 mcg by mouth every morning. 05/25/24   [provider]    Physical Exam: Vitals:   09/08/24 0730 09/08/24 0740 09/08/24 0740 09/08/24 0752  BP: 110/74     Pulse: (!) 105     Resp: (!) 24     Temp:      TempSrc:      SpO2: 100% 100% 100% 100%  Weight:      Height:       General: Alert, oriented x3, resting comfortably in no acute distress Respiratory: Diffuse rhonchi and wheezing Cardiovascular: Regular rate and rhythm w/o m/r/g   Data Reviewed:  Lab Results  Component Value Date   WBC 22.3 (H) 09/08/2024   HGB 13.3 09/08/2024   HCT 39.0 09/08/2024   MCV 94.7 09/08/2024   PLT 695 (H) 09/08/2024   Lab Results  Component Value Date   GLUCOSE 129 (H) 09/08/2024   CALCIUM 8.3 (L) 09/08/2024   NA 125 (L) 09/08/2024   K 4.8 09/08/2024   CO2 29 09/08/2024   CL 86 (L) 09/08/2024   BUN 36 (H) 09/08/2024   CREATININE 1.12 (H) 09/08/2024   Lab Results  Component Value Date   ALT 103 (H) 09/08/2024   AST 155 (H) 09/08/2024   ALKPHOS 118 09/08/2024   BILITOT 1.5 (H) 09/08/2024   Lab Results  Component Value Date   INR 1.0 08/02/2024   Radiology: [facility] Chest Port 1 View Result Date:  09/08/2024 EXAM: 1 VIEW(S) XRAY OF THE CHEST 09/08/2024 07:48:00 AM COMPARISON: 08/12/2024 CLINICAL HISTORY: Respiratory distress. FINDINGS: LUNGS AND PLEURA: Patchy airspace disease in the lung bases shows no significant change. Decreased opacity noted in the left perihilar region. Stable small bilateral pleural effusions. No pulmonary edema. No pneumothorax. HEART AND MEDIASTINUM: No acute abnormality of the cardiac and mediastinal silhouettes. BONES AND SOFT TISSUES: No acute osseous abnormality. IMPRESSION: 1. Decreased left perihilar opacity. 2. Stable patchy airspace disease in the lung bases. 3. Stable small bilateral pleural effusions. 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 Sherrod) who p/w SOB 2/2 AECOPD.  AECOPD Presumed CAP -IV CTX 1g daily to complete 5 day CAP course -PO azithromycin  500mg  daily to complete 3 day CAP course -PO prednisone  40mg  daily for 5 day AECOPD course -PTA Breztri  BID -Duonebs q4h sch for now  -Wean O2 as tolerated -Ambulatory pulse ox prior to d/c -F/u CT chest WO   Cirrhosis -PTA lasix  and spironolactone   PE -PTA apixaban  5mg  BID   Advance Care Planning:   Code Status: Full Code   Consults: N/A  Family Communication: Son  Severity of Illness: The appropriate patient status for this patient is INPATIENT. Inpatient status is judged to be reasonable and necessary in order to provide the required intensity of service to ensure the patient's safety. The patient's presenting symptoms, physical exam findings, and initial radiographic and laboratory data in the context of their chronic comorbidities is felt to place them at high risk for further  clinical deterioration. Furthermore, it is not anticipated that the patient will be medically stable for discharge from the hospital within 2 midnights of admission.   * I certify that at the point of admission it is my clinical judgment that the patient will require inpatient hospital care spanning beyond 2 midnights from the point of admission due to high intensity of service, high risk for further deterioration and high frequency of surveillance required.*   ------- I spent 55 minutes reviewing previous notes, at the bedside counseling/discussing the treatment plan, and performing clinical documentation.  Author: Marsha Ada, MD 09/08/2024 9:10 AM  For on call review [email or web address].

## 2024-09-08 NOTE — ED Triage Notes (Signed)
 Patient presents to the ER via EMS from home with SOB. Per EMS patient was found at 76% on her home O2 at 3L. Patient denies pain. Patient has a hx of COPD and CHF. Patient is awake and alert. Patient was given 10mg  Albulterol, 4g Mag, 1 Atrovent, and 125mg  Solu-Medrol.

## 2024-09-08 NOTE — ED Notes (Signed)
 RN attempted to medicate patient for pain. Patient was unable to swallow pill. Dr Georgina contacted again to request IV pain medication. RN awaiting a response. Patient and family informed.

## 2024-09-08 NOTE — ED Notes (Signed)
 RT contacted about breathing treatment that's going to be administered through Bipap.

## 2024-09-09 ENCOUNTER — Other Ambulatory Visit: Payer: Self-pay

## 2024-09-09 ENCOUNTER — Other Ambulatory Visit

## 2024-09-09 DIAGNOSIS — J441 Chronic obstructive pulmonary disease with (acute) exacerbation: Secondary | ICD-10-CM | POA: Diagnosis not present

## 2024-09-09 DIAGNOSIS — J9601 Acute respiratory failure with hypoxia: Secondary | ICD-10-CM

## 2024-09-09 LAB — BASIC METABOLIC PANEL WITH GFR
Anion gap: 16 — ABNORMAL HIGH (ref 5–15)
BUN: 44 mg/dL — ABNORMAL HIGH (ref 8–23)
CO2: 23 mmol/L (ref 22–32)
Calcium: 8.4 mg/dL — ABNORMAL LOW (ref 8.9–10.3)
Chloride: 87 mmol/L — ABNORMAL LOW (ref 98–111)
Creatinine, Ser: 1.49 mg/dL — ABNORMAL HIGH (ref 0.44–1.00)
GFR, Estimated: 35 mL/min — ABNORMAL LOW (ref >60)
Glucose, Bld: 135 mg/dL — ABNORMAL HIGH (ref 70–99)
Potassium: 5.3 mmol/L — ABNORMAL HIGH (ref 3.5–5.1)
Sodium: 126 mmol/L — ABNORMAL LOW (ref 135–145)

## 2024-09-09 MED ORDER — SODIUM ZIRCONIUM CYCLOSILICATE 10 G PO PACK
10.0000 g | PACK | Freq: Once | ORAL | Status: AC
  Administered 2024-09-09: 10 g via ORAL
  Filled 2024-09-09: qty 1

## 2024-09-09 MED ORDER — ALBUTEROL SULFATE (2.5 MG/3ML) 0.083% IN NEBU
2.5000 mg | INHALATION_SOLUTION | Freq: Four times a day (QID) | RESPIRATORY_TRACT | Status: DC | PRN
  Administered 2024-09-11 (×2): 2.5 mg via RESPIRATORY_TRACT
  Filled 2024-09-09 (×2): qty 3

## 2024-09-09 MED ORDER — IPRATROPIUM-ALBUTEROL 0.5-2.5 (3) MG/3ML IN SOLN: 3.0000 mL | Freq: Four times a day (QID) | RESPIRATORY_TRACT | Status: DC

## 2024-09-09 MED ORDER — IPRATROPIUM-ALBUTEROL 0.5-2.5 (3) MG/3ML IN SOLN
3.0000 mL | Freq: Three times a day (TID) | RESPIRATORY_TRACT | Status: DC
  Administered 2024-09-10: 3 mL via RESPIRATORY_TRACT
  Filled 2024-09-09: qty 3

## 2024-09-09 MED ORDER — ENSURE PLUS HIGH PROTEIN PO LIQD
237.0000 mL | Freq: Two times a day (BID) | ORAL | Status: DC
  Administered 2024-09-09 – 2024-09-10 (×3): 237 mL via ORAL

## 2024-09-09 NOTE — ED Notes (Signed)
 Report given to floor nurse on 6E. Notified of patients arrival.

## 2024-09-09 NOTE — ED Notes (Signed)
 Pt requesting to be taken off Bipap. RT consulted. Bipap removed pt satting at 100% on room air.

## 2024-09-09 NOTE — Progress Notes (Signed)
 [facility]  PROGRESS NOTE  Kara Newman FMW:[id number] DOB: [birth year] DOA: 09/08/2024 PCP: Elliot Charm (Inactive)   Brief HPI:    78 y.o. 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 Sherrod) who p/w SOB 2/2 AECOPD.   Pt is a limited historian and unable to provide detailed history due to ongoing SOB/DOE and BiPAP in place. From what I can gather per Epic reiview and brief interview, pt endorsed SOB for the past week that acutely worsening this morning prompting her to activate EMS. Per her report, she has been taking her newly prescribed blood thinners (Eliquis ), and diuretics (lasix  and aldactone ) as prescribed.   In the ED, pt tchycardic and tachypneic w/ hypoxia requiring BiPAP. Labs notable for VBG pH/CO2 7.33/63, Na 125, BNP 90, and WBC 22. RVP neg for RSV, influenza, and COVID-19. EDP started IV CTX/azithromycin  x1, administered IV solumedrol, and requested medicine admission.      Assessment/Plan:   Acute on chronic hypoxemic respiratory failure - Presented with worsening shortness of breath, fatigue - Chronically on oxygen 3 L/min; in the ED was found to be hypoxemic and required BiPAP - Likely in setting of COPD, possible community-acquired pneumonia -Chest x-ray from 10/27 showed decreased left perihilar opacity - Started on Rocephin  and Zithromax  - Continue Solu-Medrol and then prednisone  40 mg daily for 5 days; and then start tapering - Continue Breztri , DuoNebs every 4 hours  Liver cirrhosis - New diagnosis of liver cirrhosis; CT abdomen/pelvis done last month showed abnormal/nodular appearance of the omentum and anterior abdominal cannot exclude peritoneum malignancy/carcinomatosis -Continue Lasix , hold Aldactone  due to high  potassium  Mild hyperkalemia -Potassium is 5.3 -Will hold Aldactone  and give 1 dose of Lokelma -Follow serum potassium level in a.m.  History of pulmonary embolism -Continue apixaban  5 mg p.o. twice daily   Hypervolemic hyponatremia -Likely in setting of liver cirrhosis, fluid overload - Continue Lasix  40 mg daily - Follow serum sodium level in a.m.  Hypothyroidism - Continue Synthroid       DVT prophylaxis: Apixaban   Medications     apixaban   5 mg Oral BID   azithromycin   500 mg Oral Daily   budesonide -glycopyrrolate -formoterol   2 puff Inhalation BID   furosemide   40 mg Oral Daily   ipratropium-albuterol   3 mL Nebulization Q4H   levothyroxine   100 mcg Oral q morning   methylPREDNISolone (SOLU-MEDROL) injection  40 mg Intravenous Q24H   predniSONE   40 mg Oral Q breakfast   sodium zirconium cyclosilicate  10 g Oral Once     Data Reviewed:   CBG:  Recent Labs  Lab 09/05/24 1036  GLUCAP 119*    SpO2: 100 % O2 Flow Rate (L/min): 3 L/min FiO2 (%): 30 %    Vitals:   09/09/24 0530 09/09/24 0545 09/09/24 0600 09/09/24 0700  BP: 133/80 135/82 133/83 131/74  Pulse: (!) 30  (!) 107 (!) 104  Resp: (!) 23 (!) 24 (!) 23 18  Temp:    97.7 F (36.5 C)  TempSrc:    Oral  SpO2: (!) 80%  100% 100%  Weight:      Height:          Data Reviewed:  Basic Metabolic Panel: Recent Labs  Lab 09/08/24 0730 09/08/24 0753 09/09/24 0219  NA 125* 125* 126*  K 5.0 4.8 5.3*  CL 86*  --  87*  CO2 29  --  23  GLUCOSE 129*  --  135*  BUN 36*  --  44*  CREATININE 1.12*  --  1.49*  CALCIUM 8.3*  --  8.4*    CBC: Recent Labs  Lab 09/08/24 0730 09/08/24 0753  WBC 22.3*  --   NEUTROABS 17.5*  --   HGB 12.3 13.3  HCT 37.3 39.0  MCV 94.7  --   PLT 695*  --     LFT Recent Labs  Lab 09/08/24 0730  AST 155*  ALT 103*  ALKPHOS 118  BILITOT 1.5*  PROT 6.1*  ALBUMIN 2.3*     Antibiotics: Anti-infectives (From admission, onward)    Start     Dose/Rate  Route Frequency Ordered Stop   09/09/24 1000  azithromycin  (ZITHROMAX ) tablet 500 mg        500 mg Oral Daily 09/08/24 0926 09/11/24 0959   09/09/24 0800  cefTRIAXone  (ROCEPHIN ) 1 g in sodium chloride  0.9 % 100 mL IVPB        1 g 200 mL/hr over 30 Minutes Intravenous Every 24 hours 09/08/24 0926     09/08/24 0800  cefTRIAXone  (ROCEPHIN ) 2 g in sodium chloride  0.9 % 100 mL IVPB        2 g 200 mL/hr over 30 Minutes Intravenous  Once 09/08/24 0756 09/08/24 0915   09/08/24 0800  azithromycin  (ZITHROMAX ) 500 mg in sodium chloride  0.9 % 250 mL IVPB        500 mg 250 mL/hr over 60 Minutes Intravenous  Once 09/08/24 0756 09/08/24 1049        CONSULTS   Code Status: Full code  Family Communication: No family at bedside     Subjective   Patient feels better this morning.  Denies shortness of breath   Objective    Physical Examination:   General:   Appears in no acute distress  Cardiovascular: S1-S2, regular Respiratory: Lungs are clear to auscultation bilaterally Abdomen: Soft, mild distention, nontender to palpation, no organomegaly Extremities: No edema in the lower extremities Neurologic: Alert , Oriented x 3 answering all questions appropriately   Status is: Inpatient:             Sabas GORMAN Brod   [facility] If 7PM-7AM, please contact night-coverage at [email or web address], Office  [phone]   09/09/2024, 7:56 AM  LOS: 1 day

## 2024-09-09 NOTE — ED Notes (Signed)
 Patient informed on need for urine sample. Pt says she's not able to give sample right now, and refused straight cath.

## 2024-09-09 NOTE — ED Notes (Signed)
 Pt uses [place] 3L o2 at home, 3L [place] restarted.

## 2024-09-09 NOTE — Progress Notes (Signed)
 Floor mat implemented for fall precautions.

## 2024-09-09 NOTE — Progress Notes (Signed)
 Spoke to son  patient is forgetfu and some confusion at base line   has 24 hour care at home with [facility].

## 2024-09-10 DIAGNOSIS — J441 Chronic obstructive pulmonary disease with (acute) exacerbation: Secondary | ICD-10-CM | POA: Diagnosis not present

## 2024-09-10 DIAGNOSIS — E43 Unspecified severe protein-calorie malnutrition: Secondary | ICD-10-CM | POA: Insufficient documentation

## 2024-09-10 DIAGNOSIS — J9601 Acute respiratory failure with hypoxia: Secondary | ICD-10-CM | POA: Diagnosis not present

## 2024-09-10 DIAGNOSIS — L8990 Pressure ulcer of unspecified site, unspecified stage: Secondary | ICD-10-CM | POA: Insufficient documentation

## 2024-09-10 LAB — COMPREHENSIVE METABOLIC PANEL WITH GFR
ALT: 59 U/L — ABNORMAL HIGH (ref 0–44)
AST: 60 U/L — ABNORMAL HIGH (ref 15–41)
Albumin: 2.1 g/dL — ABNORMAL LOW (ref 3.5–5.0)
Alkaline Phosphatase: 1411 U/L — ABNORMAL HIGH (ref 38–126)
Anion gap: 13 (ref 5–15)
BUN: 46 mg/dL — ABNORMAL HIGH (ref 8–23)
CO2: 26 mmol/L (ref 22–32)
Calcium: 8.3 mg/dL — ABNORMAL LOW (ref 8.9–10.3)
Chloride: 88 mmol/L — ABNORMAL LOW (ref 98–111)
Creatinine, Ser: 1.33 mg/dL — ABNORMAL HIGH (ref 0.44–1.00)
GFR, Estimated: 40 mL/min — ABNORMAL LOW (ref >60)
Glucose, Bld: 118 mg/dL — ABNORMAL HIGH (ref 70–99)
Potassium: 4.3 mmol/L (ref 3.5–5.1)
Sodium: 127 mmol/L — ABNORMAL LOW (ref 135–145)
Total Bilirubin: 0.9 mg/dL (ref 0.0–1.2)
Total Protein: 5.2 g/dL — ABNORMAL LOW (ref 6.5–8.1)

## 2024-09-10 LAB — CBC
HCT: 30.2 % — ABNORMAL LOW (ref 36.0–46.0)
Hemoglobin: 10.3 g/dL — ABNORMAL LOW (ref 12.0–15.0)
MCH: 31 pg (ref 26.0–34.0)
MCHC: 34.1 g/dL (ref 30.0–36.0)
MCV: 91 fL (ref 80.0–100.0)
Platelets: 674 K/uL — ABNORMAL HIGH (ref 150–400)
RBC: 3.32 MIL/uL — ABNORMAL LOW (ref 3.87–5.11)
RDW: 14 % (ref 11.5–15.5)
WBC: 21.2 K/uL — ABNORMAL HIGH (ref 4.0–10.5)
nRBC: 0 % (ref 0.0–0.2)

## 2024-09-10 MED ORDER — SENNOSIDES-DOCUSATE SODIUM 8.6-50 MG PO TABS
2.0000 | ORAL_TABLET | Freq: Two times a day (BID) | ORAL | Status: DC
  Administered 2024-09-10 – 2024-09-12 (×4): 2 via ORAL
  Filled 2024-09-10 (×4): qty 2

## 2024-09-10 MED ORDER — POLYETHYLENE GLYCOL 3350 17 G PO PACK
17.0000 g | PACK | Freq: Every day | ORAL | Status: DC
  Administered 2024-09-10 – 2024-09-12 (×3): 17 g via ORAL
  Filled 2024-09-10 (×3): qty 1

## 2024-09-10 MED ORDER — ADULT MULTIVITAMIN W/MINERALS CH
1.0000 | ORAL_TABLET | Freq: Every day | ORAL | Status: DC
  Administered 2024-09-11 – 2024-09-12 (×2): 1 via ORAL
  Filled 2024-09-10 (×2): qty 1

## 2024-09-10 MED ORDER — ONDANSETRON HCL 4 MG/2ML IJ SOLN
4.0000 mg | Freq: Four times a day (QID) | INTRAMUSCULAR | Status: DC | PRN
  Administered 2024-09-10 – 2024-09-11 (×2): 4 mg via INTRAVENOUS
  Filled 2024-09-10 (×2): qty 2

## 2024-09-10 NOTE — TOC Initial Note (Addendum)
 Transition of Care [facility]) - Initial/Assessment Note    Patient Details  Name: Kara Newman MRN: [id number] Date of Birth: [birth year]  [facility] of [facility]) CM/SW Contact:    Isaiah Public, LCSWA Phone Number: 09/10/2024, 2:18 PM  Clinical Narrative:                  CSW received consult for possible SNF placement at time of discharge. Due to patients current orientation, CSW spoke with patients son Kara Newman regarding PT recommendation of SNF placement at time of discharge. Patients son reports PTA patient comes from home alone and has around the clock care from [place] care team at home. Patients son expressed understanding of PT recommendation and request to give CSW a call back on dc plan. Patients son informed CSW he is on his way to the hospital and is going to discuss with patient, and then give CSW a call back. All questions answered. No further questions reported at this time. CSW to continue to follow and assist with discharge planning needs.   Update- CSW received call from patients son who informed CSW that patient wants to return home with [facility] CSW informed CM.      Patient Goals and CMS Choice            Expected Discharge Plan and Services                                              Prior Living Arrangements/Services                       Activities of Daily Living   ADL Screening (condition at time of admission) Independently performs ADLs?: No Does the patient have a NEW difficulty with bathing/dressing/toileting/self-feeding that is expected to last >3 days?: No Does the patient have a NEW difficulty with getting in/out of bed, walking, or climbing stairs that is expected to last >3 days?: Yes (Initiates electronic notice to provider for possible PT consult) Does the patient have a NEW difficulty with communication that is expected to last >3 days?: No Is the patient deaf or have difficulty hearing?: No Does the patient have difficulty  seeing, even when wearing glasses/contacts?: No Does the patient have difficulty concentrating, remembering, or making decisions?: No  Permission Sought/Granted                  Emotional Assessment              Admission diagnosis:  Respiratory distress [R06.03] COPD exacerbation (HCC) [J44.1] Acute respiratory failure with hypoxia (HCC) [J96.01] Patient Active Problem List   Diagnosis Date Noted   COPD exacerbation (HCC) 09/08/2024   Bilateral leg edema 08/07/2024   Right upper lobe pneumonia 08/06/2024   Cirrhosis of liver (HCC) 08/06/2024   Acute on chronic respiratory failure with hypoxia (HCC) - home O2 @ 2 L/min. 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   Chronic obstructive pulmonary disease (HCC) 03/23/2021   Essential hypertension 03/23/2021   Acquired hypothyroidism 03/23/2021   Insomnia 03/23/2021   Perennial allergic rhinitis 03/23/2021   Personal history of malignant neoplasm of breast 03/23/2021   Pure hypercholesterolemia 03/23/2021   Nonallergic rhinitis 05/07/2019   PCP:  Varadarajan,  Rupashree (Inactive) Pharmacy:   [id number] [place], [place] - [address] AT [facility] OF [address] [place]  [address] Phone: [phone] Fax: [phone]  [id number] - [place], [place] - [address] AT [facility] [address] [place] [facility] [address] [place] [address] Phone: [phone] Fax: [phone]     Social Drivers of Health (SDOH) Social History: SDOH Screenings   Food Insecurity: No Food Insecurity (09/09/2024)  Housing: Low Risk  (09/09/2024)  Transportation Needs: No Transportation Needs (09/09/2024)  Utilities: Not At Risk (09/09/2024)  Social Connections: Moderately Integrated  (09/09/2024)  Recent Concern: Social Connections - Moderately Isolated (08/02/2024)  Tobacco Use: Medium Risk (09/08/2024)   SDOH Interventions:     Readmission Risk Interventions     No data to display

## 2024-09-10 NOTE — Progress Notes (Signed)
 [facility]                                                                               [employer], is a 78 y.o. female, DOB - [birth year], FMW:[id number] Admit date - 09/08/2024    Outpatient Primary MD for the patient is Varadarajan, Rupashree (Inactive)  LOS - 2  days    Brief summary   78 y.o. 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 Sherrod) who p/w SOB 2/2 AECOPD.   Pt is a limited historian and unable to provide detailed history due to ongoing SOB/DOE and BiPAP in place. From what I can gather per Epic reiview and brief interview, pt endorsed SOB for the past week that acutely worsening this morning prompting her to activate EMS. Per her report, she has been taking her newly prescribed blood thinners (Eliquis ), and diuretics (lasix  and aldactone ) as prescribed.   In the ED, pt tchycardic and tachypneic w/ hypoxia requiring BiPAP. Labs notable for VBG pH/CO2 7.33/63, Na 125, BNP 90, and WBC 22. RVP neg for RSV, influenza, and COVID-19. EDP started IV CTX/azithromycin  x1, administered IV solumedrol, and requested medicine admission.   Assessment & Plan    Assessment and Plan:  Acute on chronic respiratory failure Patient at home on 3 L of nasal cannula oxygen.  She initially required BiPAP later on transition to nasal cannula oxygen.  Probably likely she had a COPD exacerbation with community-acquired pneumonia She was started on Rocephin  and Zithromax  along with IV Solu-Medrol.  She is back on her 3 L of nasal cannula oxygen today and plan.  Continue with IV antibiotics and slow prednisone  taper. Meanwhile continue with Breztri  and DuoNebs, flutter valve.    Cirrhosis Discussed the results of the CT abdomen pelvis with the patient and  her son. Continue with Lasix  at this time.   Hyperkalemia Resolved   History of pulmonary embolism Continue with Eliquis  twice daily   Hypervolemic hyponatremia Continue to monitor   For thyroidism Continue with Synthroid .   Elevated liver enzymes  Probably from liver mets.    Leukocytosis Probably from steroids    Anemia of chronic disease Hemoglobin around 10, continue to monitor   Gently diagnosed metastatic cancer as evidenced by mets on her PET scan. Patient at this time does not want any kind of treatment patient's son agrees  palliative care consulted for goals of care. Pain control.  RN Pressure Injury Documentation: Wound 09/09/24 1200 Pressure Injury Coccyx Mid Stage 1 -  Intact skin with non-blanchable redness of a localized area usually over a bony prominence. (Active)     Wound 09/09/24 1200 Pressure Injury Heel Right Stage 1 -  Intact skin with non-blanchable redness of a localized area usually over a bony prominence. (Active)     Wound 09/09/24 1200 Pressure Injury Heel Left Stage 1 -  Intact skin with non-blanchable redness of a localized area usually over a bony prominence. (Active)    Malnutrition Type:  Nutrition Problem:  Severe Malnutrition Etiology: chronic illness   Malnutrition Characteristics:  Signs/Symptoms: severe muscle depletion, severe fat depletion   Nutrition Interventions:  Interventions: Ensure Enlive (each supplement provides 350kcal and 20 grams of protein), MVI  Estimated body mass index is 20.99 kg/m as calculated from the following:   Height as of this encounter: 5' 6 (1.676 m).   Weight as of this encounter: 59 kg.  Code Status: Full code DVT Prophylaxis:   apixaban  (ELIQUIS ) tablet 5 mg   Level of Care: Level of care: Progressive Family Communication: Updated patient's son over the phone  Disposition Plan:     Remains inpatient appropriate: Pending clinical improvement  Procedures:   None  Consultants:   Palliative care  Antimicrobials:   Anti-infectives (From admission, onward)    Start     Dose/Rate Route Frequency Ordered Stop   09/09/24 1000  azithromycin  (ZITHROMAX ) tablet 500 mg        500 mg Oral Daily 09/08/24 0926 09/10/24 0944   09/09/24 0800  cefTRIAXone  (ROCEPHIN ) 1 g in sodium chloride  0.9 % 100 mL IVPB        1 g 200 mL/hr over 30 Minutes Intravenous Every 24 hours 09/08/24 0926     09/08/24 0800  cefTRIAXone  (ROCEPHIN ) 2 g in sodium chloride  0.9 % 100 mL IVPB        2 g 200 mL/hr over 30 Minutes Intravenous  Once 09/08/24 0756 09/08/24 0915   09/08/24 0800  azithromycin  (ZITHROMAX ) 500 mg in sodium chloride  0.9 % 250 mL IVPB        500 mg 250 mL/hr over 60 Minutes Intravenous  Once 09/08/24 0756 09/08/24 1049        Medications  Scheduled Meds:  apixaban   5 mg Oral BID   budesonide -glycopyrrolate -formoterol   2 puff Inhalation BID   feeding supplement  237 mL Oral BID BM   furosemide   40 mg Oral Daily   levothyroxine   100 mcg Oral q morning   [START ON 09/11/2024] multivitamin with minerals  1 tablet Oral Daily   polyethylene glycol  17 g Oral Daily   predniSONE   40 mg Oral Q breakfast   senna-docusate  2 tablet Oral BID   Continuous Infusions:  cefTRIAXone  (ROCEPHIN )  IV 1 g (09/10/24 0954)   PRN Meds:.albuterol , oxyCODONE    Subjective:   Kara Newman was seen and examined today. Not feeling great. No chest pain, still feels weak.   Objective:   Vitals:   09/10/24 0850 09/10/24 0853 09/10/24 1130 09/10/24 1640  BP:   98/63   Pulse:  65 89 82  Resp:  17 20 13   Temp:      TempSrc:   Oral   SpO2: 100% 100% 98% 98%  Weight:      Height:        Intake/Output Summary (Last 24 hours) at 09/10/2024 1709 Last data filed at 09/10/2024 1658 Gross per 24 hour  Intake 747 ml  Output --  Net 747 ml   Filed Weights   09/08/24 0718 09/09/24 1200  Weight: 59 kg 59 kg     Exam General: ill appearing elderly woman, not  in distress.  Cardiovascular: S1 S2 auscultated, no murmurs, RRR Respiratory: scattered rhonchi. On 3 lit of [place] oxygen.  Gastrointestinal: Soft, nontender, nondistended, + bowel sounds Ext: no pedal edema bilaterally Neuro: Alert and oriented to person and place.    Data Reviewed:  I have personally reviewed following labs and imaging studies   CBC Lab Results  Component Value Date   WBC 21.2 (H) 09/10/2024   RBC 3.32 (L) 09/10/2024   HGB 10.3 (L) 09/10/2024   HCT 30.2 (L) 09/10/2024   MCV 91.0 09/10/2024   MCH 31.0 09/10/2024   PLT 674 (H) 09/10/2024   MCHC 34.1 09/10/2024   RDW 14.0 09/10/2024   LYMPHSABS 3.2 09/08/2024   MONOABS 1.3 (H) 09/08/2024   EOSABS 0.0 09/08/2024   BASOSABS 0.1 09/08/2024     Last metabolic panel Lab Results  Component Value Date   NA 127 (L) 09/10/2024   K 4.3 09/10/2024   CL 88 (L) 09/10/2024   CO2 26 09/10/2024   BUN 46 (H) 09/10/2024   CREATININE 1.33 (H) 09/10/2024   GLUCOSE 118 (H) 09/10/2024   GFRNONAA 40 (L) 09/10/2024   CALCIUM 8.3 (L) 09/10/2024   PHOS 2.2 (L) 08/05/2024   PROT 5.2 (L) 09/10/2024   ALBUMIN 2.1 (L) 09/10/2024   BILITOT 0.9 09/10/2024   ALKPHOS 1,411 (H) 09/10/2024   AST 60 (H) 09/10/2024   ALT 59 (H) 09/10/2024   ANIONGAP 13 09/10/2024    CBG (last 3)  No results for input(s): GLUCAP in the last 72 hours.    Coagulation Profile: No results for input(s): INR, PROTIME in the last 168 hours.   Radiology Studies: No results found.     Elgie Butter M.D. [facility] 09/10/2024, 5:09 PM  Available via Epic secure chat 7am-7pm After 7 pm, please refer to night coverage provider listed on amion.

## 2024-09-10 NOTE — Progress Notes (Signed)
 Mobility Specialist Progress Note;   09/10/24 1332  Mobility  Activity Respositioned in chair  Level of Assistance Moderate assist, patient does 50-74%  Assistive Device None  Activity Response Tolerated fair  Mobility Referral Yes  Mobility visit 1 Mobility  Mobility Specialist Start Time (ACUTE ONLY) 1332  Mobility Specialist Stop Time (ACUTE ONLY) 1340  Mobility Specialist Time Calculation (min) (ACUTE ONLY) 8 min   Pt sliding out from chair and requesting assistance to reposition. Required ModA to stand and slide pt up in chair. Requested BLE to be elevated which was more comfortable for pt. Further mobility deferred at this time, no reason specified. Pt left in chair with all needs met, call bell in reach. RN notified.   Lauraine Erm Mobility Specialist Please contact via SecureChat or [employer] [phone]

## 2024-09-10 NOTE — Plan of Care (Signed)
     Referral received for Kara Newman :goals of care discussion. Chart reviewed. Patient assessed and is in bedside chair with a friend visiting. Per chart, currently unable to engage appropriately in discussions. Attempted to contact patient's son/next of kin. Unable to reach. Voicemail left with contact information given.   PMT will re-attempt to contact family at a later time/date. Detailed note and recommendations to follow once GOC has been completed.   Thank you for your referral and allowing PMT to assist in Kara Newman's care.   Jeniel Slauson, PA-C Palliative Medicine Team  Team Phone # [phone]   NO CHARGE

## 2024-09-10 NOTE — Progress Notes (Signed)
 [facility] night cross cover note:   Prn iv zofran  added for nausea with single episode of non-bloody, non-bilious emesis this evening.     Eva Pore, DO Hospitalist

## 2024-09-10 NOTE — Evaluation (Signed)
 Physical Therapy Evaluation Patient Details Name: Kara Newman MRN: [id number] DOB: Oct 05, 1946 Today's Date: 09/10/2024  History of Present Illness  The pt is an 78 yo female presenting 10/27 with SOB, SpO2 76% on 3L on arrival of EMS. 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  Clinical Impression  Pt in bed upon arrival of PT, agreeable to evaluation at this time. Prior to admission the pt reports she was ambulating without DME but with frequent falls, and therefore has been largely in bed for last few weeks. Pt with significant inconsistencies with reports regarding level of assist and DME use. According to community liaison from [place], the pt was active with 24/7 aide prior to admission. The pt demos significant weakness in core, LE, and UE, limited endurance, stability, significant fear of falling, and needed mod-maxA to complete all OOB transfers. The pt did require increased O2 from 2L to 3L with mobility due to desat to low of 85% on 2L, will continue to progress functional stability, strength, and endurance during admission. Pt will require 24/7 supervision and hands-on assist for all OOB mobility after d/c, could benefit from inpatient rehab <3hours/day unless family able to arrange 24/7 supervision and assist at home.          If plan is discharge home, recommend the following: A lot of help with walking and/or transfers;A lot of help with bathing/dressing/bathroom;Assistance with cooking/housework;Direct supervision/assist for medications management;Direct supervision/assist for financial management;Assist for transportation;Help with stairs or ramp for entrance;Supervision due to cognitive status   Can travel by private vehicle   No    Equipment Recommendations None recommended by PT  Recommendations for Other Services        Functional Status Assessment Patient has had a recent decline in their functional status and demonstrates the ability to make significant improvements in function in a reasonable and predictable amount of time.     Precautions / Restrictions Precautions Precautions: Fall Recall of Precautions/Restrictions: Intact Precaution/Restrictions Comments: falls 1x/week Restrictions Weight Bearing Restrictions Per Provider Order: No      Mobility  Bed Mobility Overal bed mobility: Needs Assistance Bed Mobility: Supine to Sit     Supine to sit: Min assist, HOB elevated, Used rails     General bed mobility comments: heavy dependence on rails, increased time, assist to LE to complete movement to EOB. fearful of falling, modA to finish scoot to EOB    Transfers Overall transfer level: Needs assistance Equipment used: Rolling walker (2 wheels) Transfers: Sit to/from Stand, Bed to chair/wheelchair/BSC Sit to Stand: Max assist   Step pivot transfers: Mod assist       General transfer comment: pt dependent on mod-maxA to complete due to poor anterior wt shift, power in LE, and limited hip extension. poor standing tolerance, limited to small lateral steps    Ambulation/Gait Ambulation/Gait assistance: Mod assist Gait Distance (Feet): 3 Feet Assistive device: Rolling walker (2 wheels) Gait Pattern/deviations: Step-to pattern, Decreased stride length Gait velocity: decreased Gait velocity interpretation: <1.31 ft/sec, indicative of household ambulator   General Gait Details: limited by pt fatigue and fear of falling, modA to steady with max cues for stepping     Balance Overall balance assessment: Needs assistance, History of Falls Sitting-balance support: No upper extremity supported Sitting balance-Leahy Scale: Fair   Postural control: Posterior lean Standing balance support: Bilateral upper extremity supported, During functional activity  Standing balance-Leahy Scale:  Poor Standing balance comment: dependent on BUE support and external support, poor tolerance of challenge                             Pertinent Vitals/Pain Pain Assessment Pain Assessment: Faces Faces Pain Scale: Hurts a little bit Pain Location: R arm (IVs) Pain Descriptors / Indicators: Sore Pain Intervention(s): Monitored during session, Limited activity within patient's tolerance, Repositioned    Home Living Family/patient expects to be discharged to:: Private residence Living Arrangements: Alone Available Help at Discharge: Other (Comment) (would have to hire aide) Type of Home: House Home Access: Level entry       Home Layout: Able to live on main level with bedroom/bathroom Home Equipment: Rolling Walker (2 wheels) Additional Comments: pt denies home O2 use    Prior Function Prior Level of Function : Independent/Modified Independent;Driving;History of Falls (last six months)             Mobility Comments: reports no longer furniture surfing but frequent falls, no longer active with PT but unsure when that ended. Was golfing until ~2 months ago ADLs Comments: independent, goes to club for meals     Extremity/Trunk Assessment   Upper Extremity Assessment Upper Extremity Assessment: Generalized weakness    Lower Extremity Assessment Lower Extremity Assessment: Generalized weakness (frail, grossly 3+/5 to MMT)    Cervical / Trunk Assessment Cervical / Trunk Assessment: Kyphotic;Other exceptions Cervical / Trunk Exceptions: frail  Communication   Communication Communication: No apparent difficulties    Cognition Arousal: Alert Behavior During Therapy: WFL for tasks assessed/performed   PT - Cognitive impairments: No family/caregiver present to determine baseline, Awareness, Memory, Sequencing, Problem solving, Safety/Judgement                       PT - Cognition Comments: pt gave inconsistent answers throughout session, reports  unaware of assist available at home or support, self-limiting at times due to fear of falling Following commands: Impaired Following commands impaired: Follows one step commands inconsistently, Follows one step commands with increased time     Cueing Cueing Techniques: Verbal cues     General Comments General comments (skin integrity, edema, etc.): SpO2 to 85% on 2L with mobility, increased to 3L and maintained 92%, then improved to 99% with rest on 3L    Exercises     Assessment/Plan    PT Assessment Patient needs continued PT services  PT Problem List Decreased strength;Decreased range of motion;Decreased activity tolerance;Decreased mobility;Decreased balance;Decreased cognition;Decreased safety awareness       PT Treatment Interventions DME instruction;Gait training;Stair training;Functional mobility training;Therapeutic activities;Therapeutic exercise;Balance training;Patient/family education    PT Goals (Current goals can be found in the Care Plan section)  Acute Rehab PT Goals Patient Stated Goal: to improve strength PT Goal Formulation: With patient Time For Goal Achievement: 09/24/24 Potential to Achieve Goals: Good    Frequency Min 2X/week        AM-PAC PT 6 Clicks Mobility  Outcome Measure Help needed turning from your back to your side while in a flat bed without using bedrails?: A Little Help needed moving from lying on your back to sitting on the side of a flat bed without using bedrails?: A Little Help needed moving to and from a bed to a chair (including a wheelchair)?: A Lot Help needed standing up from a chair using your arms (e.g., wheelchair or bedside chair)?: A Lot Help  needed to walk in hospital room?: Total Help needed climbing 3-5 steps with a railing? : Total 6 Click Score: 12    End of Session Equipment Utilized During Treatment: Gait belt;Oxygen Activity Tolerance: Patient limited by fatigue Patient left: in chair;with call bell/phone  within reach;with chair alarm set Nurse Communication: Mobility status PT Visit Diagnosis: Unsteadiness on feet (R26.81);Muscle weakness (generalized) (M62.81);History of falling (Z91.81);Repeated falls (R29.6)    Time: 9043-8966 PT Time Calculation (min) (ACUTE ONLY): 37 min   Charges:   PT Evaluation $PT Eval Moderate Complexity: 1 Mod PT Treatments $Therapeutic Exercise: 8-22 mins PT General Charges $$ ACUTE PT VISIT: 1 Visit         Izetta Call, PT, DPT   Acute Rehabilitation Department Office [phone] Secure Chat Communication Preferred  Izetta JULIANNA Call 09/10/2024, 1:47 PM

## 2024-09-10 NOTE — Progress Notes (Signed)
 Initial Nutrition Assessment  DOCUMENTATION CODES:   Severe malnutrition in context of chronic illness  INTERVENTION:  Continue Regular diet Encourage PO intake  Ensure Plus High Protein po BID, each supplement provides 350 kcal and 20 grams of protein.  Add MVI w/minerals    NUTRITION DIAGNOSIS:   Severe Malnutrition related to chronic illness as evidenced by severe muscle depletion, severe fat depletion.   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 Sherrod) who p/w SOB 2/2 AECOPD.  Pt seen in room, siting in chair with lunch tray set up. Pt c/o back pain and nausea, RN notified. Also reports constipation, messaged MD about starting bowel regimen. Pt with newly dx cirrhosis. Sh reports ongoing poor po intake but unable to elaborate due to feeling fatigued during visit and in pain. RD brought patient an applesauce to eat but pt reports feeling too sick to eat. Pt reports weight loss since the beginning of the year but unsure of a specific amount not remember her previous weight.  Weight stable per chart hx. RD encouraged patient to try to eat what she can or lunch or at least drink some gingerale for her nausea and to try drinking an ensure late in the afternoon if she isn't able to eat lunch.    Admit weight: 59 kg  Current weight: 59 kg  Nutritionally Relevant Medications: lasix , synthroid , prednisone   Labs Reviewed: Sodium 127, Cl 88, Glu 118, BUN 46, Cr 1.33, Calcium 8.3, GFR 40    NUTRITION - FOCUSED PHYSICAL EXAM:  Flowsheet Row Most Recent Value  Orbital Region  Moderate depletion  Upper Arm Region Severe depletion  Thoracic and Lumbar Region Severe depletion  Buccal Region Moderate depletion  Temple Region Severe depletion  Clavicle Bone Region Severe depletion  Clavicle and Acromion Bone Region Severe depletion  Scapular Bone Region Severe depletion  Dorsal Hand Severe depletion  Patellar Region Moderate depletion  Anterior Thigh Region Moderate depletion  Posterior Calf Region Severe depletion  Hair Reviewed  Eyes Reviewed  Mouth Reviewed  Skin Reviewed  Nails Reviewed    Diet Order:   Diet Order             Diet regular Room service appropriate? Yes; Fluid consistency: Thin  Diet effective now                   EDUCATION NEEDS:   No education needs have been identified at this time  Skin:  Skin Assessment: Skin Integrity Issues: Skin Integrity Issues:: Stage I Stage I: bilateral heels, mid coccyx  Last BM:  10/26  Height:   Ht Readings from Last 1 Encounters:  09/08/24 5' 6 (1.676 m)    Weight:   Wt Readings from Last 1 Encounters:  09/09/24 59 kg    BMI:  Body mass index is 20.99 kg/m.  Estimated Nutritional Needs:   Kcal:  1500-1800 kcals  Protein:  70-90 grams  Fluid:  1.5-1.8L/d  Madalyn Potters, MS, RD, LDN Clinical Dietitian  Contact via secure chat. If unavailable, use group chat RD Inpatient.

## 2024-09-10 NOTE — TOC Initial Note (Signed)
 Transition of Care [facility]) - Initial/Assessment Note    Patient Details  Name: Kara Newman MRN: [id number] Date of Birth: [birth year]  Transition of Care [facility]) CM/SW Contact:    Sudie Erminio Deems, RN Phone Number: 09/10/2024, 3:38 PM  Clinical Narrative:  Patient presented for respiratory distress. PTA patient was from home with 24 hour personal care services with [facility]. Patient has DME oxygen via [employer], hospital bed, and rolling walker. Patient was previously active with [facility] for PT/OT, RN- plan to return home with [facility] and will need HH orders and face to face.  Inpatient Case Manager did speak with [facility] to make them aware that patient will return home. Patient has PCP and caregivers take her to appointments. No further needs identified at this time.             Expected Discharge Plan: Home w Home Health Services Barriers to Discharge: Continued Medical Work up   Patient Goals and CMS Choice Patient states their goals for this hospitalization and ongoing recovery are:: Plans to return home once stable   Choice offered to / list presented to :  (Currently active with [facility])      Expected Discharge Plan and Services In-house Referral: Clinical Social Work Discharge Planning Services: CM Consult Post Acute Care Choice: Home Health, Resumption of Svcs/PTA Provider Living arrangements for the past 2 months: Apartment      HH Arranged: RN, Disease Management, PT, OT HH Agency: [facility] Date [facility] Agency Contacted: 09/10/24 Time HH Agency Contacted: [phone] Representative spoke with at [facility] Agency: [facility]  Prior Living Arrangements/Services Living arrangements for the past 2 months: Apartment Lives with:: Other (Comment) (24 hour caregiver support) Patient language and need for interpreter reviewed:: Yes Do you feel safe going back to the place where you live?: Yes      Need for Family Participation in Patient Care: Yes (Comment) Care giver support system in  place?: Yes (comment) Current home services: Home PT, Home OT, Home RN, Other (comment), DME (personal care aides.) Criminal Activity/Legal Involvement Pertinent to Current Situation/Hospitalization: No - Comment as needed  Activities of Daily Living   ADL Screening (condition at time of admission) Independently performs ADLs?: No Does the patient have a NEW difficulty with bathing/dressing/toileting/self-feeding that is expected to last >3 days?: No Does the patient have a NEW difficulty with getting in/out of bed, walking, or climbing stairs that is expected to last >3 days?: Yes (Initiates electronic notice to provider for possible PT consult) Does the patient have a NEW difficulty with communication that is expected to last >3 days?: No Is the patient deaf or have difficulty hearing?: No Does the patient have difficulty seeing, even when wearing glasses/contacts?: No Does the patient have difficulty concentrating, remembering, or making decisions?: No  Permission Sought/Granted Permission sought to share information with : Family Supports, Magazine Features Editor, Case Estate Manager/land Agent granted to share information with : Yes, Verbal Permission Granted     Permission granted to share info w AGENCY: [facility]        Emotional Assessment Appearance:: Appears stated age Attitude/Demeanor/Rapport: Engaged Affect (typically observed): Appropriate Orientation: : Oriented to Self Alcohol / Substance Use: Not Applicable Psych Involvement: No (comment)  Admission diagnosis:  Respiratory distress [R06.03] COPD exacerbation (HCC) [J44.1] Acute respiratory failure with hypoxia (HCC) [J96.01] Patient Active Problem List   Diagnosis Date Noted   COPD exacerbation (HCC) 09/08/2024   Bilateral leg edema 08/07/2024   Right upper lobe pneumonia 08/06/2024  Cirrhosis of liver (HCC) 08/06/2024   Acute on chronic respiratory failure with hypoxia (HCC) - home O2 @ 2 L/min. 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   Chronic obstructive pulmonary disease (HCC) 03/23/2021   Essential hypertension 03/23/2021   Acquired hypothyroidism 03/23/2021   Insomnia 03/23/2021   Perennial allergic rhinitis 03/23/2021   Personal history of malignant neoplasm of breast 03/23/2021   Pure hypercholesterolemia 03/23/2021   Nonallergic rhinitis 05/07/2019   PCP:  Elliot Charm (Inactive) Pharmacy:   [id number] [place], [place] - [address] AT [facility] OF [address] [place]  [address] Phone: [phone] Fax: [phone]  [facility] #[id number] - [place], [place] - [address] AT [facility] OF [address] [facility] [address] [place] [address] Phone: [phone] Fax: [phone]     Social Drivers of Health (SDOH) Social History: SDOH Screenings   Food Insecurity: No Food Insecurity (09/09/2024)  Housing: Low Risk  (09/09/2024)  Transportation Needs: No Transportation Needs (09/09/2024)  Utilities: Not At Risk (09/09/2024)  Social Connections: Moderately Integrated (09/09/2024)  Recent Concern: Social Connections - Moderately Isolated (08/02/2024)  Tobacco Use: Medium Risk (09/08/2024)   SDOH Interventions:     Readmission Risk Interventions     No data to display

## 2024-09-11 ENCOUNTER — Inpatient Hospital Stay (HOSPITAL_COMMUNITY)

## 2024-09-11 DIAGNOSIS — J9601 Acute respiratory failure with hypoxia: Secondary | ICD-10-CM | POA: Diagnosis not present

## 2024-09-11 DIAGNOSIS — Z515 Encounter for palliative care: Secondary | ICD-10-CM

## 2024-09-11 DIAGNOSIS — Z7189 Other specified counseling: Secondary | ICD-10-CM

## 2024-09-11 DIAGNOSIS — E43 Unspecified severe protein-calorie malnutrition: Secondary | ICD-10-CM | POA: Diagnosis not present

## 2024-09-11 DIAGNOSIS — C786 Secondary malignant neoplasm of retroperitoneum and peritoneum: Secondary | ICD-10-CM | POA: Diagnosis not present

## 2024-09-11 DIAGNOSIS — J441 Chronic obstructive pulmonary disease with (acute) exacerbation: Secondary | ICD-10-CM | POA: Diagnosis not present

## 2024-09-11 LAB — URINALYSIS, ROUTINE W REFLEX MICROSCOPIC
Bacteria, UA: NONE SEEN
Bilirubin Urine: NEGATIVE
Glucose, UA: NEGATIVE mg/dL
Ketones, ur: NEGATIVE mg/dL
Leukocytes,Ua: NEGATIVE
Nitrite: NEGATIVE
Protein, ur: NEGATIVE mg/dL
Specific Gravity, Urine: 1.015 (ref 1.005–1.030)
pH: 5 (ref 5.0–8.0)

## 2024-09-11 LAB — BASIC METABOLIC PANEL WITH GFR
Anion gap: 16 — ABNORMAL HIGH (ref 5–15)
BUN: 49 mg/dL — ABNORMAL HIGH (ref 8–23)
CO2: 30 mmol/L (ref 22–32)
Calcium: 8.8 mg/dL — ABNORMAL LOW (ref 8.9–10.3)
Chloride: 86 mmol/L — ABNORMAL LOW (ref 98–111)
Creatinine, Ser: 1.34 mg/dL — ABNORMAL HIGH (ref 0.44–1.00)
GFR, Estimated: 40 mL/min — ABNORMAL LOW (ref >60)
Glucose, Bld: 125 mg/dL — ABNORMAL HIGH (ref 70–99)
Potassium: 4.8 mmol/L (ref 3.5–5.1)
Sodium: 132 mmol/L — ABNORMAL LOW (ref 135–145)

## 2024-09-11 LAB — PHOSPHORUS: Phosphorus: 3.7 mg/dL (ref 2.5–4.6)

## 2024-09-11 LAB — GLUCOSE, CAPILLARY: Glucose-Capillary: 148 mg/dL — ABNORMAL HIGH (ref 70–99)

## 2024-09-11 LAB — MAGNESIUM: Magnesium: 2.6 mg/dL — ABNORMAL HIGH (ref 1.7–2.4)

## 2024-09-11 LAB — PROCALCITONIN: Procalcitonin: 0.43 ng/mL

## 2024-09-11 MED ORDER — BISACODYL 10 MG RE SUPP
10.0000 mg | Freq: Every day | RECTAL | Status: DC | PRN
  Administered 2024-09-11: 10 mg via RECTAL
  Filled 2024-09-11: qty 1

## 2024-09-11 MED ORDER — MAGNESIUM SULFATE 2 GM/50ML IV SOLN
2.0000 g | Freq: Once | INTRAVENOUS | Status: AC
  Administered 2024-09-11: 2 g via INTRAVENOUS
  Filled 2024-09-11: qty 50

## 2024-09-11 MED ORDER — IPRATROPIUM-ALBUTEROL 0.5-2.5 (3) MG/3ML IN SOLN
3.0000 mL | RESPIRATORY_TRACT | Status: DC | PRN
  Administered 2024-09-11: 3 mL via RESPIRATORY_TRACT
  Filled 2024-09-11: qty 3

## 2024-09-11 MED ORDER — LORAZEPAM 2 MG/ML IJ SOLN: 0.5000 mg | Freq: Once | INTRAMUSCULAR | Status: DC

## 2024-09-11 MED ORDER — LORAZEPAM 0.5 MG PO TABS
0.2500 mg | ORAL_TABLET | Freq: Three times a day (TID) | ORAL | Status: DC | PRN
  Administered 2024-09-11: 0.25 mg via ORAL
  Filled 2024-09-11: qty 1

## 2024-09-11 MED ORDER — HYDROXYZINE HCL 25 MG PO TABS: 25.0000 mg | ORAL_TABLET | Freq: Three times a day (TID) | ORAL | Status: DC | PRN

## 2024-09-11 MED ORDER — BUDESON-GLYCOPYRROL-FORMOTEROL 160-9-4.8 MCG/ACT IN AERO
2.0000 | INHALATION_SPRAY | Freq: Two times a day (BID) | RESPIRATORY_TRACT | Status: DC
  Administered 2024-09-11 – 2024-09-12 (×2): 2 via RESPIRATORY_TRACT
  Filled 2024-09-11: qty 5.9

## 2024-09-11 NOTE — Plan of Care (Signed)

## 2024-09-11 NOTE — Progress Notes (Signed)
 Patient refused labs

## 2024-09-11 NOTE — Progress Notes (Signed)
 Patient c/o of SOB. MD made aware. Patient on 3L [place], SpO2 92% and greater. Lung sounds diminished, minimal air movement, expiratory wheezes, and fine crackers. Patient received breathing treatment x2.  Patient anxious, and given PRN 0.25mg   PO Ativan dose. Patient likely vomited up dose during emesis episodes.    Patient total urine output for shift equals . Bladder scan results . In and out cath ordered and completed; output total . Initial urine output yellow and clear. At the end of draining bladder, urine became brown, hazy, and had sediment. U/A collected and sent to lab, per order.   Patient had two episodes of vomiting. Estimated total emesis amount . Emesis brown in color and liquid. Patient has not had record BM since 09/07/2024. Zofran  given for nausea/vomiting. Dulcolax suppository given for constipation. MD made aware.   Vitals maintained stable through event.   Chest xray ordered, results pending. MD made aware CXR has been completed.

## 2024-09-11 NOTE — TOC Progression Note (Signed)
 Transition of Care [facility]) - Progression Note    Patient Details  Name: Kara Newman MRN: [id number] Date of Birth: July 25, 1946  Transition of Care [facility]) CM/SW Contact  Graves-Bigelow, Erminio Deems, RN Phone Number: 09/11/2024, 3:36 PM  Clinical Narrative:  Inpatient Case Manager received notification that the family wants Hospice in the home and they had chosen [facility] during the palliative care meeting. Liaison Melissa is aware and following for disposition needs. ICM did cancel the [facility] services with [facility]. ICM called Lauren on the [facility] side for aide assistance to continue. ICM will continue to follow for additional disposition needs as they arise.     Expected Discharge Plan: Home w Hospice Care Barriers to Discharge: No Barriers Identified  Expected Discharge Plan and Services In-house Referral: Clinical Social Work Discharge Planning Services: CM Consult Post Acute Care Choice: Home Health, Resumption of Svcs/PTA Provider Living arrangements for the past 2 months: Apartment                           HH Arranged: RN, Disease Management, PT, OT HH Agency: [facility] Date [facility] Agency Contacted: 09/10/24 Time HH Agency Contacted: [phone] Representative spoke with at [facility] Agency: Darleene   Social Drivers of Health (SDOH) Interventions SDOH Screenings   Food Insecurity: No Food Insecurity (09/09/2024)  Housing: Low Risk  (09/09/2024)  Transportation Needs: No Transportation Needs (09/09/2024)  Utilities: Not At Risk (09/09/2024)  Social Connections: Moderately Integrated (09/09/2024)  Recent Concern: Social Connections - Moderately Isolated (08/02/2024)  Tobacco Use: Medium Risk (09/08/2024)    Readmission Risk Interventions     No data to display

## 2024-09-11 NOTE — Progress Notes (Signed)
 Offered patient multiple times to get out of bed to chair. Patient refused to get out bed.

## 2024-09-11 NOTE — Progress Notes (Signed)
 Patient refusing morning labs.

## 2024-09-11 NOTE — Progress Notes (Signed)
 Patient c/o chest tightness/pressure. Patient lung sounds diminished with mild expiratory wheeze. Patient on 3L [place], SpO2 100%. Patient not in distress. HR 76, NSR.   Last PRN breathing treatment given at [phone].  RN administered schedule morning medications including prednisone , lasix , and rocephin . PRN oxycodone given.   RT at bedside, administered Breztri  inhaler.   Follow up assessment (~30 min. later) patient denies chest tightness/pressure.

## 2024-09-11 NOTE — Consult Note (Signed)
 Consultation Note Date: 09/11/2024   Patient Name: Kara Newman  DOB: 12-Mar-1946  MRN: [id number]  Age / Sex: 78 y.o., female  PCP: Elliot Charm (Inactive) Referring Physician: Cherlyn Labella, MD  Reason for Consultation: Establishing goals of care  HPI/Patient Profile: 78 y.o. 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   In the ED, pt tchycardic and tachypneic w/ hypoxia requiring BiPAP. Labs notable for VBG pH/CO2 7.33/63, Na 125, BNP 90, and WBC 22. She was admitted for COPD exacerbation.  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  Poor overall prognosis.  PMT has been consulted to assist with goals of care conversation.  Clinical Assessment and Goals of Care:  I have reviewed medical records including EPIC notes, labs and imaging, discussed with RN, assessed the patient and then met at the bedside with patient's adopted son and niece to discuss diagnosis prognosis, GOC, EOL wishes, disposition and options.  I introduced Palliative Medicine as specialized medical care for people living with serious illness. It focuses on providing relief from the symptoms and stress of a serious illness. The goal is to improve quality of life for both the patient and the family.  We discussed a brief life review of the patient and then focused on their current illness.  The natural disease trajectory and expectations at EOL were discussed.  I attempted to elicit values and goals of care important to the patient.    Medical History Review and Understanding:  We discussed patient's acute illness in the context of their chronic comorbidities.  Discussed likely prognosis of months.  Patient and family  understand the severity of patient's illness.  Social History: Patient tells me she has been married 3 times and she is widowed.  She has no children of her own.  Her son [name] is like an adopted family member that she has known since he was a little boy.  She lives at home with her cherished dog and 24/7 caregivers.  Functional and Nutritional State: Patient tells me she was able to get around her home without assistive device prior to this acute decline.  She was bathing and dressing herself as well.  Albumin of 2.1 on 10/29.  Palliative Symptoms: Denies symptoms at baseline  Code Status: Concepts specific to code status, artifical feeding and hydration, and rehospitalization were considered and discussed. Recommended consideration of DNR status, understanding evidenced-based poor outcomes in similar hospitalized patients, as the cause of the arrest is likely associated with chronic/terminal disease rather than a reversible acute cardio-pulmonary event.  Discussion: Initially met with patient in the morning to discuss her goals of care and quality of life.  Her priority at this time is to go back home.  Her quality of life was acceptable prior to all of this occurring.  With her permission, I reviewed her poor prognosis and hospice eligibility.  She did not seem surprised by this and when I explored her feelings further she stated I know that I am dying.  Attempted to explore whether this new finding of peritoneal carcinomatosis has changed her care preferences for interventions such as CPR, breathing tubes, feeding tubes etc.  She does not feel she would want a feeding tube but would like to discuss more with [name].  She is  agreeable to me reaching out, knows that he will be visiting later today. I called [name] and provided update on the above interaction.  He tells me he would be present in the afternoon and that he feels priority at this time is patient's comfort after discussion with her  doctors.  Schedule meeting for 2:30 PM. Return to the bedside and discussed different support systems to help patient in the home, recommending hospice for their expert assistance with symptom management rather than focus on therapy interventions.  Patient was not wanting to participate in therapy prior to admission anyway.  She is in agreement with hospice and so his family.  They have lots of experience with this from other family members.  Niece was a biomedical scientist for 10 years as well.  Everyone is in agreement with DNR to ensure that she remains comfortable even in her final moments and passes away peacefully. Explored wishes for the remainder of patient's hospitalization.  She feels okay with the current interventions in place.  She wants to go home sooner rather than later, though was agreeable to more inpatient care as appropriate such as for wounds etc.   The difference between aggressive medical intervention and comfort care was considered in light of the patient's goals of care. Hospice and Palliative Care services outpatient were explained and offered.   Discussed the importance of continued conversation with family and the medical providers regarding overall plan of care and treatment options, ensuring decisions are within the context of the patient's values and GOCs.   Questions and concerns were addressed.  Gone from my sight booklet left for review. The family was encouraged to call with questions or concerns.  PMT will continue to support holistically.   SUMMARY OF RECOMMENDATIONS   - CODE STATUS changed to DNR/DNI.  Gold form signed and placed in patient's heart chart.  Will scanned copy into EMR - Continue current care plan.  Patient hopes to go home as soon as medically reasonable - Long-term goal is for hospice at home and focus on comfort.  TOC assistance appreciated - Psychosocial and emotional report provided - Spiritual care consult declined - PMT remains available as  needed   Prognosis:  < 6 months  Discharge Planning: Home with Hospice      Primary Diagnoses: Present on Admission:  COPD exacerbation (HCC)  Acute respiratory failure with hypoxia (HCC)   Physical Exam Vitals and nursing note reviewed.  Constitutional:      General: She is not in acute distress.    Appearance: She is ill-appearing.  HENT:     Head: Normocephalic and atraumatic.  Cardiovascular:     Rate and Rhythm: Normal rate.  Pulmonary:     Effort: Pulmonary effort is normal.  Skin:    General: Skin is warm and dry.  Neurological:     Mental Status: She is alert and oriented to person, place, and time.  Psychiatric:        Behavior: Behavior normal.        Thought Content: Thought content normal.     Vital Signs: BP (P) 109/71 (BP Location: Right Arm)   Pulse 81   Temp (!) 97.3 F (36.3 C) (Oral)   Resp (!) 22   Ht 5' 6 (1.676 m)   Wt 59 kg   SpO2 100%   BMI 20.99 kg/m  Pain Scale: (P) 0-10   Pain Score: (P) 8    SpO2: SpO2: 100 % O2 Device:SpO2: 100 % O2 Flow  Rate: .O2 Flow Rate (L/min): 3 L/min    Cephus Tupy SHAUNNA Fell, PA-C  Palliative Medicine Team Team phone # [phone]  Thank you for allowing the Palliative Medicine Team to assist in the care of this patient. Please utilize secure chat with additional questions, if there is no response within 30 minutes please call the above phone number.  Palliative Medicine Team providers are available by phone from 7am to 7pm daily and can be reached through the team cell phone.  Should this patient require assistance outside of these hours, please call the patient's attending physician.    Billing based on MDM: high  Problems Addressed: One acute or chronic illness or injury that poses a threat to life or bodily function  Amount and/or Complexity of Data: Category 1:Review of prior external note(s) from each unique source, Review of the result(s) of each unique test, and Assessment requiring an  independent historian(s), Category 2:Independent interpretation of a test performed by another physician/other qualified health care professional (not separately reported), and Category 3:Discussion of management or test interpretation with external physician/other qualified health care professional/appropriate source (not separately reported)  Risks: Decision not to resuscitate or to de-escalate care because of poor prognosis

## 2024-09-11 NOTE — Progress Notes (Signed)
 [facility]                                                                               [employer], is a 78 y.o. female, DOB - 04/06/1946, FMW:[id number] Admit date - 09/08/2024    Outpatient Primary MD for the patient is Varadarajan, Rupashree (Inactive)  LOS - 3  days    Brief summary   78 y.o. 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 Sherrod) who p/w SOB 2/2 AECOPD.   Pt is a limited historian and unable to provide detailed history due to ongoing SOB/DOE and BiPAP in place. From what I can gather per Epic reiview and brief interview, pt endorsed SOB for the past week that acutely worsening this morning prompting her to activate EMS. Per her report, she has been taking her newly prescribed blood thinners (Eliquis ), and diuretics (lasix  and aldactone ) as prescribed.   In the ED, pt tchycardic and tachypneic w/ hypoxia requiring BiPAP. Labs notable for VBG pH/CO2 7.33/63, Na 125, BNP 90, and WBC 22. RVP neg for RSV, influenza, and COVID-19. EDP started IV CTX/azithromycin  x1, administered IV solumedrol, and requested medicine admission.   Assessment & Plan    Assessment and Plan:  Acute on chronic respiratory failure Patient at home on 3 L of nasal cannula oxygen.  She initially required BiPAP later on transition to nasal cannula oxygen.  Probably likely she had a COPD exacerbation with community-acquired pneumonia She was started on Rocephin  and Zithromax  along with IV Solu-Medrol.  She is back on her 3 L of nasal cannula oxygen today and plan.  Continue with IV antibiotics and slow prednisone  taper. Meanwhile continue with Breztri  and DuoNebs, flutter valve. Overnight patient became sob, tachypneic, . CXR shows No significant change in  bilateral pleural effusions, and bibasilar atelectasis or infiltrates. Added ativan for panic attacks.     Cirrhosis Discussed the results of the CT abdomen pelvis with the patient and her son. Continue with Lasix  at this time.   Hyperkalemia Resolved   History of pulmonary embolism Continue with Eliquis  twice daily   Hypervolemic hyponatremia Sodium much improved.  Currently at 132.    For thyroidism Continue with Synthroid .   Elevated liver enzymes  Probably from liver mets.    Leukocytosis Probably from steroids    Anemia of chronic disease Hemoglobin around 10, continue to monitor   Gently diagnosed metastatic cancer as evidenced by mets on her PET scan. Patient at this time does not want any kind of treatment patient's son agrees  palliative care consulted for goals of care. Pain control.  RN Pressure Injury Documentation: Wound 09/09/24 1200 Pressure Injury Coccyx Mid Stage 1 -  Intact skin with non-blanchable redness of a localized area usually over a bony prominence. (Active)     Wound 09/09/24 1200 Pressure Injury Heel Right Stage 1 -  Intact skin with non-blanchable redness of a localized area usually over a bony prominence. (Active)     Wound 09/09/24 1200  Pressure Injury Heel Left Stage 1 -  Intact skin with non-blanchable redness of a localized area usually over a bony prominence. (Active)    Malnutrition Type:  Nutrition Problem: Severe Malnutrition Etiology: chronic illness   Malnutrition Characteristics:  Signs/Symptoms: severe muscle depletion, severe fat depletion   Nutrition Interventions:  Interventions: Ensure Enlive (each supplement provides 350kcal and 20 grams of protein), MVI  Estimated body mass index is 20.99 kg/m as calculated from the following:   Height as of this encounter: 5' 6 (1.676 m).   Weight as of this encounter: 59 kg.  Code Status: Full code DVT Prophylaxis:   apixaban  (ELIQUIS ) tablet 5 mg   Level of  Care: Level of care: Progressive Family Communication: Updated family at bedside.   Disposition Plan:     Remains inpatient appropriate: Pending clinical improvement  Procedures:  None  Consultants:   Palliative care  Antimicrobials:   Anti-infectives (From admission, onward)    Start     Dose/Rate Route Frequency Ordered Stop   09/09/24 1000  azithromycin  (ZITHROMAX ) tablet 500 mg        500 mg Oral Daily 09/08/24 0926 09/10/24 0944   09/09/24 0800  cefTRIAXone  (ROCEPHIN ) 1 g in sodium chloride  0.9 % 100 mL IVPB        1 g 200 mL/hr over 30 Minutes Intravenous Every 24 hours 09/08/24 0926     09/08/24 0800  cefTRIAXone  (ROCEPHIN ) 2 g in sodium chloride  0.9 % 100 mL IVPB        2 g 200 mL/hr over 30 Minutes Intravenous  Once 09/08/24 0756 09/08/24 0915   09/08/24 0800  azithromycin  (ZITHROMAX ) 500 mg in sodium chloride  0.9 % 250 mL IVPB        500 mg 250 mL/hr over 60 Minutes Intravenous  Once 09/08/24 0756 09/08/24 1049        Medications  Scheduled Meds:  apixaban   5 mg Oral BID   budesonide -glycopyrrolate -formoterol   2 puff Inhalation BID   feeding supplement  237 mL Oral BID BM   furosemide   40 mg Oral Daily   levothyroxine   100 mcg Oral q morning   multivitamin with minerals  1 tablet Oral Daily   polyethylene glycol  17 g Oral Daily   predniSONE   40 mg Oral Q breakfast   senna-docusate  2 tablet Oral BID   Continuous Infusions:  cefTRIAXone  (ROCEPHIN )  IV 1 g (09/11/24 0806)   PRN Meds:.albuterol , LORazepam, ondansetron  (ZOFRAN ) IV, oxyCODONE    Subjective:   Kara Newman was seen and examined today. Anxious, not in distress. Comfortable on 3 lit of [place] oxygen.  No chest pain or sob now.   Objective:   Vitals:   09/11/24 0800 09/11/24 0807 09/11/24 1134 09/11/24 1408  BP:   (!) 124/101 107/68  Pulse: 76 81 71   Resp: 20 (!) 22 20   Temp: 97.8 F (36.6 C)  98 F (36.7 C) (!) 97.4 F (36.3 C)  TempSrc: Oral  Oral Oral  SpO2: 100% 100% 99%    Weight:      Height:        Intake/Output Summary (Last 24 hours) at 09/11/2024 1508 Last data filed at 09/11/2024 0304 Gross per 24 hour  Intake 400 ml  Output 100 ml  Net 300 ml   Filed Weights   09/08/24 0718 09/09/24 1200  Weight: 59 kg 59 kg     Exam General exam: Ill appearing lady, not in distress.  Respiratory system: diminished air  entry throughout. Scattered wheezing  Cardiovascular system: S1 & S2 heard, RRR Gastrointestinal system: Abdomen is nondistended, soft and nontender Central nervous system: Alert and oriented to person and place only.  Extremities: Symmetric 5 x 5 power. Skin: No rashes,  Psychiatry: anxious, confused.     Data Reviewed:  I have personally reviewed following labs and imaging studies   CBC Lab Results  Component Value Date   WBC 21.2 (H) 09/10/2024   RBC 3.32 (L) 09/10/2024   HGB 10.3 (L) 09/10/2024   HCT 30.2 (L) 09/10/2024   MCV 91.0 09/10/2024   MCH 31.0 09/10/2024   PLT 674 (H) 09/10/2024   MCHC 34.1 09/10/2024   RDW 14.0 09/10/2024   LYMPHSABS 3.2 09/08/2024   MONOABS 1.3 (H) 09/08/2024   EOSABS 0.0 09/08/2024   BASOSABS 0.1 09/08/2024     Last metabolic panel Lab Results  Component Value Date   NA 132 (L) 09/11/2024   K 4.8 09/11/2024   CL 86 (L) 09/11/2024   CO2 30 09/11/2024   BUN 49 (H) 09/11/2024   CREATININE 1.34 (H) 09/11/2024   GLUCOSE 125 (H) 09/11/2024   GFRNONAA 40 (L) 09/11/2024   CALCIUM 8.8 (L) 09/11/2024   PHOS 3.7 09/11/2024   PROT 5.2 (L) 09/10/2024   ALBUMIN 2.1 (L) 09/10/2024   BILITOT 0.9 09/10/2024   ALKPHOS 1,411 (H) 09/10/2024   AST 60 (H) 09/10/2024   ALT 59 (H) 09/10/2024   ANIONGAP 16 (H) 09/11/2024    CBG (last 3)  No results for input(s): GLUCAP in the last 72 hours.    Coagulation Profile: No results for input(s): INR, PROTIME in the last 168 hours.   Radiology Studies: DG Chest Port 1 View Result Date: 09/11/2024 CLINICAL DATA:  Shortness of breath. EXAM:  PORTABLE CHEST 1 VIEW COMPARISON:  None Available. FINDINGS: The heart size and mediastinal contours are within normal limits. Small to moderate bilateral pleural effusion show no significant change. Bibasilar atelectasis or infiltrates are also stable. Changes of COPD again noted. IMPRESSION: No significant change in bilateral pleural effusions, and bibasilar atelectasis or infiltrates. COPD. Electronically Signed   By: Norleen DELENA Kil M.D.   On: 09/11/2024 06:50       Elgie Butter M.D. [facility] 09/11/2024, 3:08 PM  Available via Epic secure chat 7am-7pm After 7 pm, please refer to night coverage provider listed on amion.

## 2024-09-11 NOTE — Evaluation (Signed)
 Occupational Therapy Evaluation Patient Details Name: Kara Newman MRN: [id number] DOB: 1946/02/04 Today's Date: 09/11/2024   History of Present Illness   The pt is an 78 yo female presenting 10/27 with SOB, SpO2 76% on 3L on arrival of EMS. 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     Clinical Impressions Pt presents with decline in function and safety with ADLs and ADL mobility with impaired strength, balance and endurance. Pt reports that PTA she lives alone and and with ADLs/selfcare, goes to club for meals, uses no AD for mobility/walking but frequent falls, golfing until ~2 months ago. Pt with inconsistencies regarding PLOF and DME use; per PT eval note info from community liaison from [place], the pt was active with 24/7 aide prior to admission. Pt currently requires min A with increased time to sit EOB, min A with UB ADLs, max-total A with LB ADLs and toileting tasks. Pt adamantly declined STS, SPTs to chair multiple times and stated I know I'm dying I can't do anything. OT will follow acutely to maximize level of function and safety     If plan is discharge home, recommend the following:   A lot of help with bathing/dressing/bathroom;A lot of help with walking and/or transfers;Assist for transportation;Assistance with cooking/housework;Help with stairs or ramp for entrance     Functional Status Assessment   Patient has had a recent decline in their functional status and demonstrates the ability to make significant improvements in function in a reasonable and predictable amount of time.     Equipment Recommendations   Other (comment) (defer)     Recommendations for Other Services         Precautions/Restrictions   Precautions Precautions: Fall Precaution/Restrictions Comments: pt reports multiple  falls at home Restrictions Weight Bearing Restrictions Per Provider Order: No     Mobility Bed Mobility Overal bed mobility: Needs Assistance Bed Mobility: Supine to Sit, Sit to Supine     Supine to sit: Min assist, HOB elevated, Used rails Sit to supine: Min assist   General bed mobility comments: increased time, use of rails, assist with LE mgt and to elevate trunk. Pt often stating I can't before attempting    Transfers                   General transfer comment: pt refused STS, transfers to chair multiple times despite encouragment from OT and RN      Balance Overall balance assessment: Needs assistance, History of Falls Sitting-balance support: No upper extremity supported Sitting balance-Leahy Scale: Fair                                     ADL either performed or assessed with clinical judgement   ADL Overall ADL's : Needs assistance/impaired Eating/Feeding: Set up;Sitting;Bed level   Grooming: Wash/dry hands;Wash/dry face;Contact guard assist   Upper Body Bathing: Minimal assistance;Sitting   Lower Body Bathing: Maximal assistance   Upper Body Dressing : Minimal assistance   Lower Body Dressing: Maximal assistance     Toilet Transfer Details (indicate cue type and reason): declined, per PT eval yesterday pt required max/mod A STS, Step pivot Toileting- Clothing Manipulation and Hygiene: Total assistance;Bed level               Vision Baseline Vision/History:  1 Wears glasses Ability to See in Adequate Light: 0 Adequate Patient Visual Report: No change from baseline       Perception         Praxis         Pertinent Vitals/Pain Pain Assessment Pain Assessment: Faces Faces Pain Scale: Hurts a little bit Pain Location: all over Pain Descriptors / Indicators: Sore Pain Intervention(s): Monitored during session, Repositioned, Limited activity within patient's tolerance     Extremity/Trunk Assessment Upper  Extremity Assessment Upper Extremity Assessment: Generalized weakness;Right hand dominant   Lower Extremity Assessment Lower Extremity Assessment: Defer to PT evaluation   Cervical / Trunk Assessment Cervical / Trunk Assessment: Kyphotic;Other exceptions   Communication Communication Communication: No apparent difficulties   Cognition Arousal: Alert Behavior During Therapy: WFL for tasks assessed/performed Cognition: No apparent impairments                               Following commands: Impaired Following commands impaired: Follows one step commands inconsistently, Follows one step commands with increased time     Cueing  General Comments   Cueing Techniques: Verbal cues      Exercises     Shoulder Instructions      Home Living Family/patient expects to be discharged to:: Private residence Living Arrangements: Alone Available Help at Discharge: Other (Comment) (plans to hire help) Type of Home: House Home Access: Level entry     Home Layout: Able to live on main level with bedroom/bathroom     Bathroom Shower/Tub: Producer, Television/film/video: Standard     Home Equipment: Agricultural Consultant (2 wheels)   Additional Comments: pt denies home O2 use      Prior Functioning/Environment Prior Level of Function : Independent/Modified Independent;Driving;History of Falls (last six months)             Mobility Comments: reports no longer furniture surfing but frequent falls. Was golfing until ~2 months ago ADLs Comments: per pt reports she was Ind with ADLs/selfcare, goes to club for meals. Reports no longer furniture surfing but frequent falls. Was golfing until ~2 months ago    OT Problem List: Decreased strength;Impaired balance (sitting and/or standing);Decreased activity tolerance;Pain   OT Treatment/Interventions: Self-care/ADL training;Therapeutic exercise;Patient/family education;Balance training;Therapeutic activities;DME and/or AE  instruction      OT Goals(Current goals can be found in the care plan section)   Acute Rehab OT Goals Patient Stated Goal: none stated OT Goal Formulation: With patient Time For Goal Achievement: 09/25/24 Potential to Achieve Goals: Good ADL Goals Pt Will Perform Grooming: with supervision;with set-up;sitting Pt Will Perform Upper Body Bathing: with contact guard assist;with supervision;sitting Pt Will Perform Lower Body Bathing: with mod assist;with min assist;sitting/lateral leans Pt Will Perform Upper Body Dressing: with contact guard assist;with supervision;sitting Pt Will Transfer to Toilet: with mod assist;stand pivot transfer;bedside commode Pt Will Perform Toileting - Clothing Manipulation and hygiene: with max assist;with mod assist;sitting/lateral leans;sit to/from stand   OT Frequency:  Min 2X/week    Co-evaluation              AM-PAC OT 6 Clicks Daily Activity     Outcome Measure Help from another person eating meals?: None Help from another person taking care of personal grooming?: A Little Help from another person toileting, which includes using toliet, bedpan, or urinal?: Total Help from another person bathing (including washing, rinsing, drying)?: A Lot Help from another person to put on and  taking off regular upper body clothing?: A Little Help from another person to put on and taking off regular lower body clothing?: Total 6 Click Score: 14   End of Session    Activity Tolerance: Patient limited by fatigue Patient left: in bed;with call bell/phone within reach;with nursing/sitter in room  OT Visit Diagnosis: History of falling (Z91.81);Muscle weakness (generalized) (M62.81);Other abnormalities of gait and mobility (R26.89)                Time: 8997-8965 OT Time Calculation (min): 32 min Charges:  OT General Charges $OT Visit: 1 Visit OT Evaluation $OT Eval Moderate Complexity: 1 Mod OT Treatments $Therapeutic Activity: 8-22 mins    Jacques Karna Loose 09/11/2024, 12:44 PM

## 2024-09-11 NOTE — Progress Notes (Signed)
 Pt c/o SOB and difficulty breathing sats 92% on 2LNC, increased to 4 LNC sats are now 97-98%, pt states she still does not have relief, prn neb given states no improvement . Pt sitting up at a 90 degree angle in bed , RR 18, does not appear to be in acute distress , breathing regular and unlabored . Provider on call paged , see new orders.   09/11/24 0637  Vitals  Temp (!) 97.3 F (36.3 C)  Temp Source Oral  BP 115/63  MAP (mmHg) 80  BP Location Right Arm  BP Method Automatic  Patient Position (if appropriate) Lying  Pulse Rate 81  Pulse Rate Source Monitor  ECG Heart Rate 82  Resp 20  MEWS COLOR  MEWS Score Color Green  Oxygen Therapy  SpO2 100 %  O2 Device Nasal Cannula  O2 Flow Rate (L/min) 4 L/min  MEWS Score  MEWS Temp 0  MEWS Systolic 0  MEWS Pulse 0  MEWS RR 0  MEWS LOC 0  MEWS Score 0  Provider Notification  Provider Name/Title Howerter MD  Date Provider Notified 09/11/24  Time Provider Notified 0630  Method of Notification Page  Notification Reason Requested by patient/family (Pt states that she can't breathe , despite interventions see progress note)  Provider response See new orders  Date of Provider Response 09/11/24  Time of Provider Response [phone]

## 2024-09-11 NOTE — Progress Notes (Signed)
 [facility] night cross cover note:   Regarding this patient who is hospitalized with acute COPD exacerbation in the setting of suspected community-acquired pneumonia, I was notified by the patient's RN that the patient is complaining of worsening shortness of breath this morning, with RN also conveying evidence of bilateral wheezing.   Most recent vital signs notable for the following: Afebrile; heart rates in the 60s; blood pressure 116/67.  Oxygen saturation 98% on 4 L nasal cannula, relative to oxygen saturations earlier this evening, which were in the range of 98 to 100% on 2 L nasal cannula.  No report of any associated chest pain.   In the setting of her presenting community-acquired pneumonia, she is receiving azithromycin  and Rocephin .  Regarding her presenting acute COPD exacerbation, she is on daily prednisone , Breztri  inhaler twice daily, as well as as needed albuterol  nebulizer.  Currently receiving a nebulizer treatment.  I have ordered stat EKG, stat chest x-ray, as well as the following stat labs: BNP, procalcitonin level, CBC, BMP, magnesium and phosphorus levels.  In the meantime I have ordered magnesium sulfate 2 g IV. Will also check blood gas at this time.    Eva Pore, DO Hospitalist

## 2024-09-12 DIAGNOSIS — J9601 Acute respiratory failure with hypoxia: Secondary | ICD-10-CM | POA: Diagnosis not present

## 2024-09-12 DIAGNOSIS — E8770 Fluid overload, unspecified: Secondary | ICD-10-CM

## 2024-09-12 DIAGNOSIS — J441 Chronic obstructive pulmonary disease with (acute) exacerbation: Secondary | ICD-10-CM | POA: Diagnosis not present

## 2024-09-12 DIAGNOSIS — E43 Unspecified severe protein-calorie malnutrition: Secondary | ICD-10-CM | POA: Diagnosis not present

## 2024-09-12 MED ORDER — LORAZEPAM 0.5 MG PO TABS: 0.2500 mg | ORAL_TABLET | Freq: Every day | ORAL | 0 refills | Status: AC | PRN

## 2024-09-12 MED ORDER — FUROSEMIDE 40 MG PO TABS: 20.0000 mg | ORAL_TABLET | Freq: Every day | ORAL | 1 refills | Status: AC

## 2024-09-12 MED ORDER — AMOXICILLIN-POT CLAVULANATE 875-125 MG PO TABS: 1.0000 | ORAL_TABLET | Freq: Two times a day (BID) | ORAL | 0 refills | Status: AC

## 2024-09-12 MED ORDER — BISACODYL 10 MG RE SUPP: 10.0000 mg | Freq: Every day | RECTAL | 0 refills | Status: AC | PRN

## 2024-09-12 MED ORDER — SENNOSIDES-DOCUSATE SODIUM 8.6-50 MG PO TABS: 2.0000 | ORAL_TABLET | Freq: Two times a day (BID) | ORAL | Status: AC

## 2024-09-12 MED ORDER — ADULT MULTIVITAMIN W/MINERALS CH: 1.0000 | ORAL_TABLET | Freq: Every day | ORAL | Status: AC

## 2024-09-12 MED ORDER — ENSURE PLUS HIGH PROTEIN PO LIQD: 237.0000 mL | Freq: Two times a day (BID) | ORAL | 2 refills | Status: AC

## 2024-09-12 MED ORDER — PREDNISONE 10 MG PO TABS: ORAL_TABLET | ORAL | 0 refills | Status: AC

## 2024-09-12 MED ORDER — POLYETHYLENE GLYCOL 3350 17 G PO PACK: 17.0000 g | PACK | Freq: Every day | ORAL | 0 refills | Status: AC

## 2024-09-12 NOTE — Care Management Important Message (Signed)
 Important Message  Patient Details  Name: LAVAEH BAU MRN: [id number] Date of Birth: August 25, 1946   Important Message Given:  Yes - Medicare IM     Vonzell Arrie Sharps 09/12/2024, 1:07 PM

## 2024-09-12 NOTE — TOC Transition Note (Signed)
 Transition of Care [facility]) - Discharge Note   Patient Details  Name: Kara Newman MRN: [id number] Date of Birth: 08/15/1946  Transition of Care [facility]) CM/SW Contact:  Sudie Erminio Deems, RN Phone Number: 09/12/2024, 11:17 AM   Clinical Narrative: Patient will transition home with [facility]. ICM spoke with son regarding transition home and he is agreeable to ambulance transport. ICM called [facility] and they will arrive within 45 minutes. Staff RN is aware. No further needs identified at this time.   Barriers to Discharge: No Barriers Identified   Patient Goals and CMS Choice Patient states their goals for this hospitalization and ongoing recovery are:: Plans to return home once stable   Choice offered to / list presented to :  (Currently active with [facility])  Discharge Plan and Services Additional resources added to the After Visit Summary for   In-house Referral: Clinical Social Work Discharge Planning Services: CM Consult Post Acute Care Choice: Home Health, Resumption of Svcs/PTA Provider            HH Arranged: RN, Disease Management, PT, OT HH Agency: [facility] Date [facility] Agency Contacted: 09/10/24 Time HH Agency Contacted: 1537 Representative spoke with at [facility] Agency: [place]  Social Drivers of Health (SDOH) Interventions SDOH Screenings   Food Insecurity: No Food Insecurity (09/09/2024)  Housing: Low Risk  (09/09/2024)  Transportation Needs: No Transportation Needs (09/09/2024)  Utilities: Not At Risk (09/09/2024)  Social Connections: Moderately Integrated (09/09/2024)  Recent Concern: Social Connections - Moderately Isolated (08/02/2024)  Tobacco Use: Medium Risk (09/08/2024)   Readmission Risk Interventions     No data to display

## 2024-09-12 NOTE — Progress Notes (Signed)
 [facility] [phone] [facility] Liaison Note  Received request from [facility] for hospice services at home after discharge. Spoke with patient's son, [name] to initiate education related to hospice philosophy, services and team approach to care. Son verbalized understanding of information given. Per discussion, the plan is for discharge home today or tomorrow.   DME needs discussed. Patient has the following equipment in the home: hospital bed, walker, BSC, wheelchair and oxygen. Family requests the following equipment for delivery: none.  Please send signed and completed DNR home with patient/family. Please provide prescriptions at discharge as needed to ensure ongoing symptom management.  [facility] information and contact numbers given to [place]. Please call with any concerns.  Thank you for the opportunity to participate in this patient's care.   Eleanor Nail, LPN [facility] Liaison [phone]

## 2024-09-12 NOTE — Progress Notes (Addendum)
 Kara Newman is discharging home with Hospice.  Med details and AVS printed and placed in Packet in chart. Per [employer] .  [facility] has been called and education has been given to son via telephone.

## 2024-09-12 NOTE — Discharge Summary (Signed)
 Physician Discharge Summary   Patient: Kara Newman MRN: [id number] DOB: 1946-05-30  Admit date:     09/08/2024  Discharge date: 09/12/24  Discharge Physician: Elgie Butter   PCP: Elliot Charm (Inactive)   Recommendations at discharge:  Please follow up with hospice MD as recommended.   Discharge Diagnoses: Principal Problem:   COPD exacerbation (HCC) Active Problems:   Acute respiratory failure with hypoxia (HCC)   Protein-calorie malnutrition, severe   Pressure injury of skin    Hospital Course: 78 y.o. 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 Sherrod) who p/w SOB 2/2 AECOPD.   Pt is a limited historian and unable to provide detailed history due to ongoing SOB/DOE and BiPAP in place. From what I can gather per Epic reiview and brief interview, pt endorsed SOB for the past week that acutely worsening this morning prompting her to activate EMS. Per her report, she has been taking her newly prescribed blood thinners (Eliquis ), and diuretics (lasix  and aldactone ) as prescribed.   In the ED, pt tchycardic and tachypneic w/ hypoxia requiring BiPAP. Labs notable for VBG pH/CO2 7.33/63, Na 125, BNP 90, and WBC 22. RVP neg for RSV, influenza, and COVID-19. EDP started IV CTX/azithromycin  x1, administered IV solumedrol, and requested medicine admission.  Assessment and Plan:    Acute on chronic respiratory failure Patient at home on 3 L of nasal cannula oxygen.  She initially required BiPAP later on transition to nasal cannula oxygen.  Probably likely she had a COPD exacerbation with community-acquired pneumonia She was started on Rocephin  and Zithromax  along with IV Solu-Medrol.  She is back on her 3 L of nasal cannula oxygen today and plan.  She has  completed 4 days of antibiotics , we will transition to augemtin for 3 more days and slow prednisone  taper. Meanwhile resume home bronchodilators. Meanwhile continue with Breztri  and DuoNebs, flutter valve.      Cirrhosis Discussed the results of the CT abdomen pelvis with the patient and her son. Continue with Lasix  at this time.     Hyperkalemia Resolved     History of pulmonary embolism Continue with Eliquis  twice daily     Hypervolemic hyponatremia Sodium much improved.  Currently at 132.      For thyroidism Continue with Synthroid .     Elevated liver enzymes  Probably from liver mets.      Leukocytosis Probably from steroids       Anemia of chronic disease Hemoglobin around 10, continue to monitor  Anxiety:  Started the patient ona very low dose ativan, which seems to be helping her.      Recently diagnosed metastatic cancer as evidenced by mets on her PET scan. Patient at this time does not want any kind of treatment , patient's son agrees  palliative care consulted for goals of care. After discussions with family, plan for discharge home with hospice.     RN Pressure Injury Documentation: Wound 09/09/24 1200 Pressure Injury Coccyx Mid Stage 1 -  Intact skin with non-blanchable redness of a localized area usually over a bony prominence. (Active)     Wound 09/09/24 1200 Pressure Injury Heel Right Stage 1 -  Intact skin with non-blanchable redness of a localized area usually over a bony prominence. (Active)     Wound 09/09/24 1200 Pressure Injury  Heel Left Stage 1 -  Intact skin with non-blanchable redness of a localized area usually over a bony prominence. (Active)   wound care consulted and recommendations given.    Malnutrition Type:   Nutrition Problem: Severe Malnutrition Etiology: chronic illness     Malnutrition Characteristics:   Signs/Symptoms: severe muscle depletion, severe fat depletion     Nutrition Interventions:   Interventions:  Ensure Enlive (each supplement provides 350kcal and 20 grams of protein), MVI   Estimated body mass index is 20.99 kg/m as calculated from the following:   Height as of this encounter: 5' 6 (1.676 m).   Weight as of this encounter: 59 kg.         Consultants: palliative care.  Procedures performed: none.   Disposition: Hospice care Diet recommendation:  Regular diet DISCHARGE MEDICATION: Allergies as of 09/12/2024       Reactions   Ace Inhibitors    Other reaction(s): Cough (ALLERGY/intolerance)   Benzalkonium Chloride Other (See Comments)   Caused an infection   Levothyroxine  Sodium Other (See Comments)   Other reaction(s): troubles in the past, wants brand Synthroid    Latex Rash   Sulfa Antibiotics Other (See Comments)   Patient cannot recall if she has an allergy.  She does not take this medication due to it being ineffective.         Medication List     STOP taking these medications    spironolactone  50 MG tablet Commonly known as: ALDACTONE        TAKE these medications    acetaminophen  500 MG tablet Commonly known as: TYLENOL  Take 500 mg by mouth every 6 (six) hours as needed for mild pain (pain score 1-3) or headache.   albuterol  108 (90 Base) MCG/ACT inhaler Commonly known as: Ventolin  HFA Inhale 2 puffs into the lungs every 4 (four) hours as needed for wheezing or shortness of breath.   amoxicillin-clavulanate 875-125 MG tablet Commonly known as: AUGMENTIN Take 1 tablet by mouth 2 (two) times daily for 3 days.   Apixaban  Starter Pack (10mg  and 5mg ) Commonly known as: ELIQUIS  STARTER PACK Take as directed on package: start with two-5mg  tablets twice daily for 7 days. On day 8, switch to one-5mg  tablet twice daily. What changed:  how much to take how to take this when to take this additional instructions   bisacodyl 10 MG suppository Commonly known as: DULCOLAX Place 1 suppository (10 mg total) rectally daily as needed for moderate  constipation.   Breztri  Aerosphere 160-9-4.8 MCG/ACT Aero inhaler Generic drug: budesonide -glycopyrrolate -formoterol  Inhale 2 puffs into the lungs in the morning and at bedtime.   feeding supplement Liqd Take 237 mLs by mouth 2 (two) times daily between meals.   furosemide  40 MG tablet Commonly known as: Lasix  Take 0.5 tablets (20 mg total) by mouth daily. What changed: how much to take   ipratropium-albuterol  0.5-2.5 (3) MG/3ML Soln Commonly known as: DUONEB Take 3 mLs by nebulization every 6 (six) hours as needed.   LORazepam 0.5 MG tablet Commonly known as: ATIVAN Take 0.5 tablets (0.25 mg total) by mouth daily as needed for up to 10 days for anxiety.   multivitamin with minerals Tabs tablet Take 1 tablet by mouth daily. Start taking on: September 13, 2024   OXYGEN Inhale 3 L into the lungs at bedtime.   polyethylene glycol 17 g packet Commonly known as: MIRALAX / GLYCOLAX Take 17 g by mouth daily. Start taking on: September 13, 2024   predniSONE  10 MG tablet  Commonly known as: DELTASONE  Prednisone  30 mg daily for 3 days followed by  Prednisone  20 mg daily for 3 days followed by  Prednisone  10 mg daily for 3 days   senna-docusate 8.6-50 MG tablet Commonly known as: Senokot-S Take 2 tablets by mouth 2 (two) times daily.   Synthroid  100 MCG tablet Generic drug: levothyroxine  Take 100 mcg by mouth every morning.        Follow-up Information     [facility] Follow up.   Why: Hospice RN-office to call with visit times. Contact information: [address] [place] [address] [phone]                Discharge Exam: Fredricka Weights   09/08/24 0718 09/09/24 1200  Weight: 59 kg 59 kg   General exam: Appears calm and comfortable  Respiratory system: Clear to auscultation. Respiratory effort normal. Cardiovascular system: S1 & S2 heard, RRR. No JVD,  Gastrointestinal system: Abdomen is nondistended, soft and nontender. Central nervous  system: Alert and oriented. No focal neurological deficits. Extremities: Symmetric 5 x 5 power. Skin: No rashes, lesions or ulcers Psychiatry: Mood & affect appropriate.    Condition at discharge: fair  The results of significant diagnostics from this hospitalization (including imaging, microbiology, ancillary and laboratory) are listed below for reference.   Imaging Studies: DG Abd 1 View Result Date: 09/11/2024 CLINICAL DATA:  Vomiting. EXAM: ABDOMEN - 1 VIEW COMPARISON:  None Available. FINDINGS: No bowel dilatation or evidence of obstruction. There is moderate stool throughout the colon. No free air. No acute osseous pathology. IMPRESSION: Moderate colonic stool burden. No bowel obstruction. Electronically Signed   By: Vanetta Chou M.D.   On: 09/11/2024 18:31   DG Chest Port 1 View Result Date: 09/11/2024 CLINICAL DATA:  Shortness of breath. EXAM: PORTABLE CHEST 1 VIEW COMPARISON:  None Available. FINDINGS: The heart size and mediastinal contours are within normal limits. Small to moderate bilateral pleural effusion show no significant change. Bibasilar atelectasis or infiltrates are also stable. Changes of COPD again noted. IMPRESSION: No significant change in bilateral pleural effusions, and bibasilar atelectasis or infiltrates. COPD. Electronically Signed   By: Norleen DELENA Kil M.D.   On: 09/11/2024 06:50   NM PET Image Initial (PI) Skull Base To Thigh (F-18 FDG) Result Date: 09/08/2024 EXAM: PET AND CT SKULL BASE TO MID THIGH 09/05/2024 12:41:35 PM TECHNIQUE: RADIOPHARMACEUTICAL: 6.29 mCi F-18 FDG Uptake time 60 minutes. Glucose level 119 mg/dl. PET imaging was acquired from the base of the skull to the mid thighs. Non-contrast enhanced computed tomography was obtained for attenuation correction and anatomic localization. COMPARISON: CTA of the chest of 08/02/2024. Abdominal pelvic CT also of 08/02/2024. CLINICAL HISTORY: Lung nodules, multiple. EOV; Lung nodules, multiple, Personal  history of malignant neoplasm of breast, Malignant ascites (HCC), Peritoneal carcinomatosis (HCC) FSB = 119; 6.29 MCI F-18 FDG RAC GHT FINDINGS: HEAD AND NECK: No metabolically active cervical lymphadenopathy. A focus of hypermetabolism favor to rise from the posterior right the lobe of the thyroid  measures SUV 8.0 and is without correlate mass on image 42/4. Bilateral carotid atherosclerosis. CHEST: No metabolically active lymphadenopathy. Multifocal peripheral pulmonary lesions bilaterally are relatively similar to the 08/02/2024 CT; many of these demonstrate relatively mild hypermetabolism. Example a well-defined anteromedial left upper lobe nodular density at 1.9 cm and SUV 2.1 on image 21/7. Increase in small to moderate bilateral pleural effusions. Increased bibasilar airspace disease with hypermetabolism example in the right lower lobe at SUV 4.8. Foci of right sided pleural hypermetabolism  without well-defined pleural mass. example inferiorly at SUV 3.8. Aortic and coronary artery atherosclerosis. ABDOMEN AND PELVIS: Extensive omental / peritoneal hypermetabolism in the setting of ascites and omental thickening. Example left abdominal focus of omental thickening and hypermetabolism at SUV 6.7 on image 125/4. Hysterectomy with hypermetabolic soft tissue fullness above the vaginal cuff, measuring 3.1 x 2.6 cm and SUV 7.3 on image 159/4. No abdominal pelvic nodal hypermetabolism. Cirrhosis. New moderate intrahepatic biliary duct dilatation most significant in the left lobe including on image 104/4. Aortic atherosclerosis. SABRA BONES AND SOFT TISSUE: No abnormal FDG activity localizes to the bones. No metabolically active aggressive osseous lesion. IMPRESSION: 1. Omental / peritoneal hypermetabolism in the setting of abdominal ascites and omental thickening. Favor peritoneal carcinomatosis. 2. Hypermetabolic soft tissue fullness superior to the vaginal cuff in the setting of prior hysterectomy. This could represent  a metastatic peritoneal implant or cervical/uterine primary. 3. Increased bilateral pleural effusions with multifocal mildly hypermetabolic pulmonary parenchymal opacities and right-sided pleural hypermetabolism. Given the appearance of the abdomen and pelvis, favor metastatic disease; the favored primary is breast cancer, given the clinical history of prior right mastectomy. 4. Bibasilar increased airspace disease in the setting of pleural effusions. This could represent concurrent atelectasis. 5. New left intrahepatic biliary duct dilatation since 08/02/2024. Correlate with bilirubin levels. Consider either pre and postcontrast abdominal MRI or repeat abdominal CT. 6. suspect a hypermetabolic right-sided thyroid  nodule. Consider further evaluation with a dedicated thyroid  ultrasound. Electronically signed by: Rockey Kilts MD 09/08/2024 02:02 PM EDT RP Workstation: [id number]   DG Chest Port 1 View Result Date: 09/08/2024 EXAM: 1 VIEW(S) XRAY OF THE CHEST 09/08/2024 07:48:00 AM COMPARISON: 08/12/2024 CLINICAL HISTORY: Respiratory distress. FINDINGS: LUNGS AND PLEURA: Patchy airspace disease in the lung bases shows no significant change. Decreased opacity noted in the left perihilar region. Stable small bilateral pleural effusions. No pulmonary edema. No pneumothorax. HEART AND MEDIASTINUM: No acute abnormality of the cardiac and mediastinal silhouettes. BONES AND SOFT TISSUES: No acute osseous abnormality. IMPRESSION: 1. Decreased left perihilar opacity. 2. Stable patchy airspace disease in the lung bases. 3. Stable small bilateral pleural effusions. Electronically signed by: Norleen Kil MD 09/08/2024 07:55 AM EDT RP Workstation: [id number]   IR Paracentesis Result Date: 08/28/2024 INDICATION: Recurrent ascites was suspicious for peritoneal carcinomatosis EXAM: ULTRASOUND GUIDED  PARACENTESIS MEDICATIONS: None. COMPLICATIONS: None immediate. PROCEDURE: Informed written consent was obtained from the patient  after a discussion of the risks, benefits and alternatives to treatment. A timeout was performed prior to the initiation of the procedure. Initial ultrasound scanning demonstrates a large amount of ascites within the right lower abdominal quadrant. The right lower abdomen was prepped and draped in the usual sterile fashion. 1% lidocaine  was used for local anesthesia. Following this, a Yueh catheter was introduced. An ultrasound image was saved for documentation purposes. The paracentesis was performed. The catheter was removed and a dressing was applied. The patient tolerated the procedure well without immediate post procedural complication. FINDINGS: A total of approximately 3 L of a straw-colored fluid was removed. Samples were sent to the laboratory as requested by the clinical team. IMPRESSION: Successful ultrasound-guided paracentesis yielding 3 liters of peritoneal fluid. Electronically Signed   By: Cordella Banner   On: 08/28/2024 11:33    Microbiology: Results for orders placed or performed during the hospital encounter of 09/08/24  Resp panel by RT-PCR (RSV, Flu A&B, Covid) Anterior Nasal Swab     Status: None   Collection Time: 09/08/24  7:24 AM   Specimen:  Anterior Nasal Swab  Result Value Ref Range Status   SARS Coronavirus 2 by RT PCR NEGATIVE NEGATIVE Final   Influenza A by PCR NEGATIVE NEGATIVE Final   Influenza B by PCR NEGATIVE NEGATIVE Final    Comment: (NOTE) The Xpert Xpress SARS-CoV-2/FLU/RSV plus assay is intended as an aid in the diagnosis of influenza from Nasopharyngeal swab specimens and should not be used as a sole basis for treatment. Nasal washings and aspirates are unacceptable for Xpert Xpress SARS-CoV-2/FLU/RSV testing.  Fact Sheet for Patients: bloggercourse.com  Fact Sheet for Healthcare Providers: seriousbroker.it  This test is not yet approved or cleared by the [place]  FDA and has been authorized for  detection and/or diagnosis of SARS-CoV-2 by FDA under an Emergency Use Authorization (EUA). This EUA will remain in effect (meaning this test can be used) for the duration of the COVID-19 declaration under Section 564(b)(1) of the Act, 21 U.S.C. section 360bbb-3(b)(1), unless the authorization is terminated or revoked.     Resp Syncytial Virus by PCR NEGATIVE NEGATIVE Final    Comment: (NOTE) Fact Sheet for Patients: bloggercourse.com  Fact Sheet for Healthcare Providers: seriousbroker.it  This test is not yet approved or cleared by the [place]  FDA and has been authorized for detection and/or diagnosis of SARS-CoV-2 by FDA under an Emergency Use Authorization (EUA). This EUA will remain in effect (meaning this test can be used) for the duration of the COVID-19 declaration under Section 564(b)(1) of the Act, 21 U.S.C. section 360bbb-3(b)(1), unless the authorization is terminated or revoked.  Performed at [facility], [address], [place], [place] [address]     Labs: CBC: Recent Labs  Lab 09/08/24 0730 09/08/24 0753 09/10/24 0749  WBC 22.3*  --  21.2*  NEUTROABS 17.5*  --   --   HGB 12.3 13.3 10.3*  HCT 37.3 39.0 30.2*  MCV 94.7  --  91.0  PLT 695*  --  674*   Basic Metabolic Panel: Recent Labs  Lab 09/08/24 0730 09/08/24 0753 09/09/24 0219 09/10/24 0749 09/11/24 0655  NA 125* 125* 126* 127* 132*  K 5.0 4.8 5.3* 4.3 4.8  CL 86*  --  87* 88* 86*  CO2 29  --  23 26 30   GLUCOSE 129*  --  135* 118* 125*  BUN 36*  --  44* 46* 49*  CREATININE 1.12*  --  1.49* 1.33* 1.34*  CALCIUM 8.3*  --  8.4* 8.3* 8.8*  MG  --   --   --   --  2.6*  PHOS  --   --   --   --  3.7   Liver Function Tests: Recent Labs  Lab 09/08/24 0730 09/10/24 0749  AST 155* 60*  ALT 103* 59*  ALKPHOS 118 1,411*  BILITOT 1.5* 0.9  PROT 6.1* 5.2*  ALBUMIN 2.3* 2.1*   CBG: Recent Labs  Lab 09/05/24 1036 09/11/24 1734   GLUCAP 119* 148*    Discharge time spent: 38 minutes.   Signed: Elgie Butter, MD [facility] 09/12/2024

## 2024-09-12 NOTE — Plan of Care (Signed)

## 2024-09-19 ENCOUNTER — Ambulatory Visit: Admitting: Nurse Practitioner

## 2024-09-19 DIAGNOSIS — I272 Pulmonary hypertension, unspecified: Secondary | ICD-10-CM

## 2024-09-19 DIAGNOSIS — E8770 Fluid overload, unspecified: Secondary | ICD-10-CM

## 2024-09-21 NOTE — Progress Notes (Deleted)
 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 female returns to clinic today for follow-up visit.  The patient has been following with Dr. Sherrod for reactive thrombocytosis.  She was last seen in clinic on 08/26/2024.  At her last appointment the patient was endorsing several falls over the past year and significant weakness.  The patient was hospitalized in September 2025 with pulmonary embolism and had imaging studies that showed concern for either multifocal lung tumor or infectious process.  A CT of the abdomen and pelvis suggested possible carcinomatosis with ascites.  Cytology was negative for malignancy.  Since she was last seen she was hospitalized from 10/27 to 10/31.  She was found to have newly diagnosed cirrhosis.  She is taking Eliquis  for the blood thinner.  She was treated with antibiotics and Solu-Medrol.  She was hospitalized for acute on chronic respiratory failure.  She is on 3 L of supplemental oxygen.  She also was discharged on Augmentin and a slow prednisone  taper.  She also has inhalers with Breztri  and DuoNeb flutter valve at home.  She had a repeat paracentesis on 08/28/2024 that again showed no malignant cells and reactive mesothelial cells  Dr. Sherrod had ordered a PET scan at her last appointment to rule out malignancy.  Of note the patient had prior hysterectomy and history of breast cancer?  She also has new intrahepatic biliary ductal dilation that is new since 08/02/2024  left breast cancer status post left mastectomy followed by hormonal treatment at [facility] more than 10 years ag  ***who was her provider???  ***Ask about history of GU cancer.   Today she is reporting fatigue and  weakness.  She has been reporting decreased appetite and weight loss.  She denies any fever, chills, night sweats, or unexplained weight loss.  Chest pain, shortness of breath, cough, or hemoptysis.  Nausea, vomiting, diarrhea, or constipation.  Abdominal pain?  She denies any headache or visual changes.  She denies any rashes or skin changes.  She is here today for evaluation to review her PET scan and to discuss the next steps in her care  MEDICAL HISTORY: Past Medical History:  Diagnosis Date   Breast cancer (HCC)    had left mastectomy.    CHF (congestive heart failure) (HCC)    COPD (chronic obstructive pulmonary disease) (HCC)    Endometriosis    History of colonic polyps 03/23/2021   IMO SNOMED Dx Update Oct 2024      ALLERGIES:  is allergic to ace inhibitors, benzalkonium chloride, levothyroxine  sodium, latex, and sulfa antibiotics.  MEDICATIONS:  Current Outpatient Medications  Medication Sig Dispense Refill   acetaminophen  (TYLENOL ) 500 MG tablet Take 500 mg by mouth every 6 (six) hours as needed for mild pain (pain score 1-3) or headache.     albuterol  (VENTOLIN  HFA) 108 (90 Base) MCG/ACT inhaler Inhale 2 puffs into the lungs every 4 (four) hours as needed for wheezing or shortness of breath. 72 each 3   APIXABAN  (ELIQUIS ) VTE STARTER PACK (10MG  AND 5MG ) Take as directed on package: start with two-5mg  tablets twice daily for 7 days. On day 8, switch to one-5mg  tablet twice daily. (Patient taking differently: Take 5 mg by mouth 2 (two) times daily.) 74 each 0  bisacodyl (DULCOLAX) 10 MG suppository Place 1 suppository (10 mg total) rectally daily as needed for moderate constipation. 12 suppository 0   budesonide -glycopyrrolate -formoterol  (BREZTRI  AEROSPHERE) 160-9-4.8 MCG/ACT AERO inhaler Inhale 2 puffs into the lungs in the morning and at bedtime. 3 each 3   feeding supplement (ENSURE PLUS HIGH PROTEIN) LIQD Take 237 mLs by mouth 2 (two) times daily between meals. 42660 mL 2    furosemide  (LASIX ) 40 MG tablet Take 0.5 tablets (20 mg total) by mouth daily. 30 tablet 1   ipratropium-albuterol  (DUONEB) 0.5-2.5 (3) MG/3ML SOLN Take 3 mLs by nebulization every 6 (six) hours as needed. 360 mL 0   LORazepam (ATIVAN) 0.5 MG tablet Take 0.5 tablets (0.25 mg total) by mouth daily as needed for up to 10 days for anxiety. 5 tablet 0   Multiple Vitamin (MULTIVITAMIN WITH MINERALS) TABS tablet Take 1 tablet by mouth daily.     OXYGEN Inhale 3 L into the lungs at bedtime.     polyethylene glycol (MIRALAX / GLYCOLAX) 17 g packet Take 17 g by mouth daily. 14 each 0   predniSONE  (DELTASONE ) 10 MG tablet Prednisone  30 mg daily for 3 days followed by  Prednisone  20 mg daily for 3 days followed by  Prednisone  10 mg daily for 3 days 18 tablet 0   senna-docusate (SENOKOT-S) 8.6-50 MG tablet Take 2 tablets by mouth 2 (two) times daily.     SYNTHROID  100 MCG tablet Take 100 mcg by mouth every morning.     No current facility-administered medications for this visit.    SURGICAL HISTORY:  Past Surgical History:  Procedure Laterality Date   ABDOMINAL HYSTERECTOMY     IR PARACENTESIS  08/28/2024   MASTECTOMY      REVIEW OF SYSTEMS:   Review of Systems  Constitutional: Negative for appetite change, chills, fatigue, fever and unexpected weight change.  HENT:   Negative for mouth sores, nosebleeds, sore throat and trouble swallowing.   Eyes: Negative for eye problems and icterus.  Respiratory: Negative for cough, hemoptysis, shortness of breath and wheezing.   Cardiovascular: Negative for chest pain and leg swelling.  Gastrointestinal: Negative for abdominal pain, constipation, diarrhea, nausea and vomiting.  Genitourinary: Negative for bladder incontinence, difficulty urinating, dysuria, frequency and hematuria.   Musculoskeletal: Negative for back pain, gait problem, neck pain and neck stiffness.  Skin: Negative for itching and rash.  Neurological: Negative for dizziness, extremity  weakness, gait problem, headaches, light-headedness and seizures.  Hematological: Negative for adenopathy. Does not bruise/bleed easily.  Psychiatric/Behavioral: Negative for confusion, depression and sleep disturbance. The patient is not nervous/anxious.     PHYSICAL EXAMINATION:  There were no vitals taken for this visit.  ECOG PERFORMANCE STATUS: {CHL ONC ECOG [id number]  Physical Exam  Constitutional: Oriented to person, place, and time and well-developed, well-nourished, and in no distress. No distress.  HENT:  Head: Normocephalic and atraumatic.  Mouth/Throat: Oropharynx is clear and moist. No oropharyngeal exudate.  Eyes: Conjunctivae are normal. Right eye exhibits no discharge. Left eye exhibits no discharge. No scleral icterus.  Neck: Normal range of motion. Neck supple.  Cardiovascular: Normal rate, regular rhythm, normal heart sounds and intact distal pulses.   Pulmonary/Chest: Effort normal and breath sounds normal. No respiratory distress. No wheezes. No rales.  Abdominal: Soft. Bowel sounds are normal. Exhibits no distension and no mass. There is no tenderness.  Musculoskeletal: Normal range of motion. Exhibits no edema.  Lymphadenopathy:    No cervical adenopathy.  Neurological: Alert and oriented to  person, place, and time. Exhibits normal muscle tone. Gait normal. Coordination normal.  Skin: Skin is warm and dry. No rash noted. Not diaphoretic. No erythema. No pallor.  Psychiatric: Mood, memory and judgment normal.  Vitals reviewed.  LABORATORY DATA: Lab Results  Component Value Date   WBC 21.2 (H) 09/10/2024   HGB 10.3 (L) 09/10/2024   HCT 30.2 (L) 09/10/2024   MCV 91.0 09/10/2024   PLT 674 (H) 09/10/2024      Chemistry      Component Value Date/Time   NA 132 (L) 09/11/2024 0655   K 4.8 09/11/2024 0655   CL 86 (L) 09/11/2024 0655   CO2 30 09/11/2024 0655   BUN 49 (H) 09/11/2024 0655   CREATININE 1.34 (H) 09/11/2024 0655   CREATININE 0.97 08/26/2024  1431      Component Value Date/Time   CALCIUM 8.8 (L) 09/11/2024 0655   ALKPHOS 1,411 (H) 09/10/2024 0749   AST 60 (H) 09/10/2024 0749   AST 64 (H) 08/26/2024 1431   ALT 59 (H) 09/10/2024 0749   ALT 47 (H) 08/26/2024 1431   BILITOT 0.9 09/10/2024 0749   BILITOT 0.9 08/26/2024 1431       RADIOGRAPHIC STUDIES:  DG Abd 1 View Result Date: 09/11/2024 CLINICAL DATA:  Vomiting. EXAM: ABDOMEN - 1 VIEW COMPARISON:  None Available. FINDINGS: No bowel dilatation or evidence of obstruction. There is moderate stool throughout the colon. No free air. No acute osseous pathology. IMPRESSION: Moderate colonic stool burden. No bowel obstruction. Electronically Signed   By: Vanetta Chou M.D.   On: 09/11/2024 18:31   DG Chest Port 1 View Result Date: 09/11/2024 CLINICAL DATA:  Shortness of breath. EXAM: PORTABLE CHEST 1 VIEW COMPARISON:  None Available. FINDINGS: The heart size and mediastinal contours are within normal limits. Small to moderate bilateral pleural effusion show no significant change. Bibasilar atelectasis or infiltrates are also stable. Changes of COPD again noted. IMPRESSION: No significant change in bilateral pleural effusions, and bibasilar atelectasis or infiltrates. COPD. Electronically Signed   By: Norleen DELENA Kil M.D.   On: 09/11/2024 06:50   NM PET Image Initial (PI) Skull Base To Thigh (F-18 FDG) Result Date: 09/08/2024 EXAM: PET AND CT SKULL BASE TO MID THIGH 09/05/2024 12:41:35 PM TECHNIQUE: RADIOPHARMACEUTICAL: 6.29 mCi F-18 FDG Uptake time 60 minutes. Glucose level 119 mg/dl. PET imaging was acquired from the base of the skull to the mid thighs. Non-contrast enhanced computed tomography was obtained for attenuation correction and anatomic localization. COMPARISON: CTA of the chest of 08/02/2024. Abdominal pelvic CT also of 08/02/2024. CLINICAL HISTORY: Lung nodules, multiple. EOV; Lung nodules, multiple, Personal history of malignant neoplasm of breast, Malignant ascites (HCC),  Peritoneal carcinomatosis (HCC) FSB = 119; 6.29 MCI F-18 FDG RAC GHT FINDINGS: HEAD AND NECK: No metabolically active cervical lymphadenopathy. A focus of hypermetabolism favor to rise from the posterior right the lobe of the thyroid  measures SUV 8.0 and is without correlate mass on image 42/4. Bilateral carotid atherosclerosis. CHEST: No metabolically active lymphadenopathy. Multifocal peripheral pulmonary lesions bilaterally are relatively similar to the 08/02/2024 CT; many of these demonstrate relatively mild hypermetabolism. Example a well-defined anteromedial left upper lobe nodular density at 1.9 cm and SUV 2.1 on image 21/7. Increase in small to moderate bilateral pleural effusions. Increased bibasilar airspace disease with hypermetabolism example in the right lower lobe at SUV 4.8. Foci of right sided pleural hypermetabolism without well-defined pleural mass. example inferiorly at SUV 3.8. Aortic and coronary artery atherosclerosis. ABDOMEN AND PELVIS: Extensive omental /  peritoneal hypermetabolism in the setting of ascites and omental thickening. Example left abdominal focus of omental thickening and hypermetabolism at SUV 6.7 on image 125/4. Hysterectomy with hypermetabolic soft tissue fullness above the vaginal cuff, measuring 3.1 x 2.6 cm and SUV 7.3 on image 159/4. No abdominal pelvic nodal hypermetabolism. Cirrhosis. New moderate intrahepatic biliary duct dilatation most significant in the left lobe including on image 104/4. Aortic atherosclerosis. SABRA BONES AND SOFT TISSUE: No abnormal FDG activity localizes to the bones. No metabolically active aggressive osseous lesion. IMPRESSION: 1. Omental / peritoneal hypermetabolism in the setting of abdominal ascites and omental thickening. Favor peritoneal carcinomatosis. 2. Hypermetabolic soft tissue fullness superior to the vaginal cuff in the setting of prior hysterectomy. This could represent a metastatic peritoneal implant or cervical/uterine primary. 3.  Increased bilateral pleural effusions with multifocal mildly hypermetabolic pulmonary parenchymal opacities and right-sided pleural hypermetabolism. Given the appearance of the abdomen and pelvis, favor metastatic disease; the favored primary is breast cancer, given the clinical history of prior right mastectomy. 4. Bibasilar increased airspace disease in the setting of pleural effusions. This could represent concurrent atelectasis. 5. New left intrahepatic biliary duct dilatation since 08/02/2024. Correlate with bilirubin levels. Consider either pre and postcontrast abdominal MRI or repeat abdominal CT. 6. suspect a hypermetabolic right-sided thyroid  nodule. Consider further evaluation with a dedicated thyroid  ultrasound. Electronically signed by: Rockey Kilts MD 09/08/2024 02:02 PM EDT RP Workstation: [id number]   DG Chest Port 1 View Result Date: 09/08/2024 EXAM: 1 VIEW(S) XRAY OF THE CHEST 09/08/2024 07:48:00 AM COMPARISON: 08/12/2024 CLINICAL HISTORY: Respiratory distress. FINDINGS: LUNGS AND PLEURA: Patchy airspace disease in the lung bases shows no significant change. Decreased opacity noted in the left perihilar region. Stable small bilateral pleural effusions. No pulmonary edema. No pneumothorax. HEART AND MEDIASTINUM: No acute abnormality of the cardiac and mediastinal silhouettes. BONES AND SOFT TISSUES: No acute osseous abnormality. IMPRESSION: 1. Decreased left perihilar opacity. 2. Stable patchy airspace disease in the lung bases. 3. Stable small bilateral pleural effusions. Electronically signed by: Norleen Kil MD 09/08/2024 07:55 AM EDT RP Workstation: [id number]   IR Paracentesis Result Date: 08/28/2024 INDICATION: Recurrent ascites was suspicious for peritoneal carcinomatosis EXAM: ULTRASOUND GUIDED  PARACENTESIS MEDICATIONS: None. COMPLICATIONS: None immediate. PROCEDURE: Informed written consent was obtained from the patient after a discussion of the risks, benefits and alternatives to  treatment. A timeout was performed prior to the initiation of the procedure. Initial ultrasound scanning demonstrates a large amount of ascites within the right lower abdominal quadrant. The right lower abdomen was prepped and draped in the usual sterile fashion. 1% lidocaine  was used for local anesthesia. Following this, a Yueh catheter was introduced. An ultrasound image was saved for documentation purposes. The paracentesis was performed. The catheter was removed and a dressing was applied. The patient tolerated the procedure well without immediate post procedural complication. FINDINGS: A total of approximately 3 L of a straw-colored fluid was removed. Samples were sent to the laboratory as requested by the clinical team. IMPRESSION: Successful ultrasound-guided paracentesis yielding 3 liters of peritoneal fluid. Electronically Signed   By: Cordella Banner   On: 08/28/2024 11:33     ASSESSMENT/PLAN:  This is a very pleasant 78 year old Caucasian female that was initially seen in the clinic for thrombocytosis likely reactive in nature.  She also has a history of breast cancer for which she was treated at [facility] over 10 years ago.  The patient was recently found to have possible peritoneal carcinomatosis.  The patient has cytology  performed from paracentesis x 2 which has been negative for any malignant cells.  The patient recently had a PET scan.  The patient was seen with Dr. Sherrod today.  Dr. Sherrod personally and independently reviewed the scan and discussed the results with the patient today.  The scan showed concern for metastatic malignancy.  Given her history of breast cancer this could be related to her breast cancer.  Ductal dilation and GI?  Dr. Sherrod recommends stat biopsy of the ***  Pain?  Oxygen?  If any new or worsening symptoms she was advised to seek emergency evaluation.  Hospice and palliative care?   The patient was advised to call immediately if  she has any concerning symptoms in the interval. The patient voices understanding of current disease status and treatment options and is in agreement with the current care plan. All questions were answered. The patient knows to call the clinic with any problems, questions or concerns. We can certainly see the patient much sooner if necessary   No orders of the defined types were placed in this encounter.    I spent {CHL ONC TIME VISIT - DTPQU:[id number]} counseling the patient face to face. The total time spent in the appointment was {CHL ONC TIME VISIT - DTPQU:[id number]}.  Roshana Shuffield L Pailyn [place], PA-C 09/21/24

## 2024-09-22 ENCOUNTER — Inpatient Hospital Stay: Attending: Internal Medicine

## 2024-09-22 ENCOUNTER — Telehealth: Payer: Self-pay

## 2024-09-22 ENCOUNTER — Inpatient Hospital Stay: Admitting: Physician Assistant

## 2024-09-22 NOTE — Telephone Encounter (Signed)
 Tried to reach patient in regards to missed appt today.  Asked for a return call to reschedule.  Also, sent scheduling a message to reschedule.

## 2024-10-01 ENCOUNTER — Telehealth: Payer: Self-pay | Admitting: Internal Medicine

## 2024-10-01 NOTE — Telephone Encounter (Signed)
 Rescheduled patients missed appointments. Called and left a voicemail with the details

## 2024-10-08 ENCOUNTER — Other Ambulatory Visit: Payer: Self-pay

## 2024-10-08 DIAGNOSIS — K7469 Other cirrhosis of liver: Secondary | ICD-10-CM

## 2024-10-08 DIAGNOSIS — R180 Malignant ascites: Secondary | ICD-10-CM

## 2024-10-08 DIAGNOSIS — C786 Secondary malignant neoplasm of retroperitoneum and peritoneum: Secondary | ICD-10-CM

## 2024-10-08 DIAGNOSIS — Z853 Personal history of malignant neoplasm of breast: Secondary | ICD-10-CM

## 2024-10-08 NOTE — Addendum Note (Signed)
 Addended by: MUNRO, Jelesa Mangini N on: 10/08/2024 03:46 PM   Modules accepted: Orders

## 2024-10-13 ENCOUNTER — Inpatient Hospital Stay: Admitting: Internal Medicine

## 2024-10-13 ENCOUNTER — Inpatient Hospital Stay: Attending: Internal Medicine

## 2024-10-13 DEATH — deceased

## 2024-10-29 ENCOUNTER — Ambulatory Visit (HOSPITAL_COMMUNITY)

## 2024-11-03 ENCOUNTER — Other Ambulatory Visit

## 2024-11-03 ENCOUNTER — Telehealth (HOSPITAL_COMMUNITY): Payer: Self-pay | Admitting: Nurse Practitioner

## 2024-11-03 NOTE — Telephone Encounter (Signed)
 Patient  NO SHOWED - We will not reach out to reschedule due to NO SHOW RATE 16%. Order will be removed fromt he echo WQ.
